# Patient Record
Sex: Female | Born: 1935
Health system: Southern US, Community
[De-identification: ages and names within clinical notes are randomized; demographics above are authoritative.]

## PROBLEM LIST (undated history)

## (undated) DIAGNOSIS — C189 Malignant neoplasm of colon, unspecified: Secondary | ICD-10-CM

## (undated) DIAGNOSIS — M1990 Unspecified osteoarthritis, unspecified site: Secondary | ICD-10-CM

## (undated) DIAGNOSIS — Z85038 Personal history of other malignant neoplasm of large intestine: Secondary | ICD-10-CM

## (undated) DIAGNOSIS — I714 Abdominal aortic aneurysm, without rupture, unspecified: Secondary | ICD-10-CM

## (undated) DIAGNOSIS — I6523 Occlusion and stenosis of bilateral carotid arteries: Secondary | ICD-10-CM

## (undated) DIAGNOSIS — I2510 Atherosclerotic heart disease of native coronary artery without angina pectoris: Secondary | ICD-10-CM

## (undated) DIAGNOSIS — E785 Hyperlipidemia, unspecified: Secondary | ICD-10-CM

## (undated) DIAGNOSIS — I10 Essential (primary) hypertension: Secondary | ICD-10-CM

## (undated) DIAGNOSIS — Z8619 Personal history of other infectious and parasitic diseases: Secondary | ICD-10-CM

## (undated) DIAGNOSIS — Z9889 Other specified postprocedural states: Secondary | ICD-10-CM

## (undated) HISTORY — DX: Personal history of other infectious and parasitic diseases: Z86.19

## (undated) HISTORY — DX: Hyperlipidemia, unspecified: E78.5

## (undated) HISTORY — PX: CATARACT EXTRACTION, BILATERAL: SHX1313

## (undated) HISTORY — DX: Occlusion and stenosis of bilateral carotid arteries: I65.23

## (undated) HISTORY — DX: Malignant neoplasm of colon, unspecified: C18.9

## (undated) HISTORY — DX: Essential (primary) hypertension: I10

## (undated) HISTORY — DX: Unspecified osteoarthritis, unspecified site: M19.90

## (undated) HISTORY — DX: Abdominal aortic aneurysm, without rupture, unspecified: I71.40

## (undated) HISTORY — DX: Abdominal aortic aneurysm, without rupture: I71.4

## (undated) HISTORY — DX: Personal history of other malignant neoplasm of large intestine: Z85.038

## (undated) HISTORY — DX: Atherosclerotic heart disease of native coronary artery without angina pectoris: I25.10

## (undated) HISTORY — DX: Other specified postprocedural states: Z98.890

---

## 1985-09-08 ENCOUNTER — Encounter: Payer: Self-pay | Admitting: Internal Medicine

## 1999-03-08 ENCOUNTER — Encounter: Payer: Self-pay | Admitting: Family Medicine

## 1999-03-08 ENCOUNTER — Encounter: Admission: RE | Admit: 1999-03-08 | Discharge: 1999-03-08 | Payer: Self-pay | Admitting: Family Medicine

## 1999-04-10 ENCOUNTER — Other Ambulatory Visit: Admission: RE | Admit: 1999-04-10 | Discharge: 1999-04-10 | Payer: Self-pay | Admitting: Family Medicine

## 2000-05-20 ENCOUNTER — Encounter: Admission: RE | Admit: 2000-05-20 | Discharge: 2000-05-20 | Payer: Self-pay | Admitting: *Deleted

## 2001-06-17 ENCOUNTER — Encounter: Admission: RE | Admit: 2001-06-17 | Discharge: 2001-06-17 | Payer: Self-pay | Admitting: *Deleted

## 2001-08-18 ENCOUNTER — Encounter (INDEPENDENT_AMBULATORY_CARE_PROVIDER_SITE_OTHER): Payer: Self-pay

## 2001-08-18 ENCOUNTER — Ambulatory Visit (HOSPITAL_COMMUNITY): Admission: RE | Admit: 2001-08-18 | Discharge: 2001-08-18 | Payer: Self-pay | Admitting: *Deleted

## 2001-08-25 ENCOUNTER — Encounter (INDEPENDENT_AMBULATORY_CARE_PROVIDER_SITE_OTHER): Payer: Self-pay

## 2001-08-25 ENCOUNTER — Inpatient Hospital Stay (HOSPITAL_COMMUNITY): Admission: EM | Admit: 2001-08-25 | Discharge: 2001-09-01 | Payer: Self-pay | Admitting: *Deleted

## 2001-08-25 ENCOUNTER — Encounter: Payer: Self-pay | Admitting: General Surgery

## 2001-09-01 ENCOUNTER — Encounter: Payer: Self-pay | Admitting: Gastroenterology

## 2002-03-24 HISTORY — PX: CORONARY ARTERY BYPASS GRAFT: SHX141

## 2002-03-24 HISTORY — PX: PARTIAL COLECTOMY: SHX5273

## 2002-03-26 ENCOUNTER — Encounter (HOSPITAL_COMMUNITY): Admission: RE | Admit: 2002-03-26 | Discharge: 2002-06-24 | Payer: Self-pay | Admitting: *Deleted

## 2002-06-29 ENCOUNTER — Encounter: Admission: RE | Admit: 2002-06-29 | Discharge: 2002-06-29 | Payer: Self-pay | Admitting: *Deleted

## 2002-10-18 ENCOUNTER — Encounter (INDEPENDENT_AMBULATORY_CARE_PROVIDER_SITE_OTHER): Payer: Self-pay | Admitting: Specialist

## 2002-10-18 ENCOUNTER — Ambulatory Visit (HOSPITAL_COMMUNITY): Admission: RE | Admit: 2002-10-18 | Discharge: 2002-10-18 | Payer: Self-pay | Admitting: *Deleted

## 2003-02-22 ENCOUNTER — Inpatient Hospital Stay (HOSPITAL_COMMUNITY): Admission: AD | Admit: 2003-02-22 | Discharge: 2003-02-27 | Payer: Self-pay | Admitting: *Deleted

## 2003-03-27 ENCOUNTER — Encounter (HOSPITAL_COMMUNITY): Admission: RE | Admit: 2003-03-27 | Discharge: 2003-06-25 | Payer: Self-pay | Admitting: *Deleted

## 2003-08-17 ENCOUNTER — Encounter: Admission: RE | Admit: 2003-08-17 | Discharge: 2003-08-17 | Payer: Self-pay | Admitting: Cardiothoracic Surgery

## 2003-09-13 ENCOUNTER — Encounter: Admission: RE | Admit: 2003-09-13 | Discharge: 2003-09-13 | Payer: Self-pay | Admitting: *Deleted

## 2004-02-07 ENCOUNTER — Ambulatory Visit: Payer: Self-pay | Admitting: *Deleted

## 2004-02-28 ENCOUNTER — Ambulatory Visit: Payer: Self-pay

## 2004-04-16 ENCOUNTER — Ambulatory Visit: Payer: Self-pay | Admitting: *Deleted

## 2004-07-31 IMAGING — CR DG CHEST 2V
2 series · 2 of 2 positions shown · non-contrast
Comparison: none

CLINICAL DATA: CABG [DATE], follow up.
 CHEST X-RAY 
 Two views of the chest are compared to a chest x-ray from [HOSPITAL] dated 02/25/03.  The lungs are clear.  No pleural effusion is seen.  Mild cardiomegaly is stable.  Median sternotomy sutures are noted from CABG.
 IMPRESSION
 No active lung disease.  Stable cardiomegaly.

[view not recorded (1 of 2)]
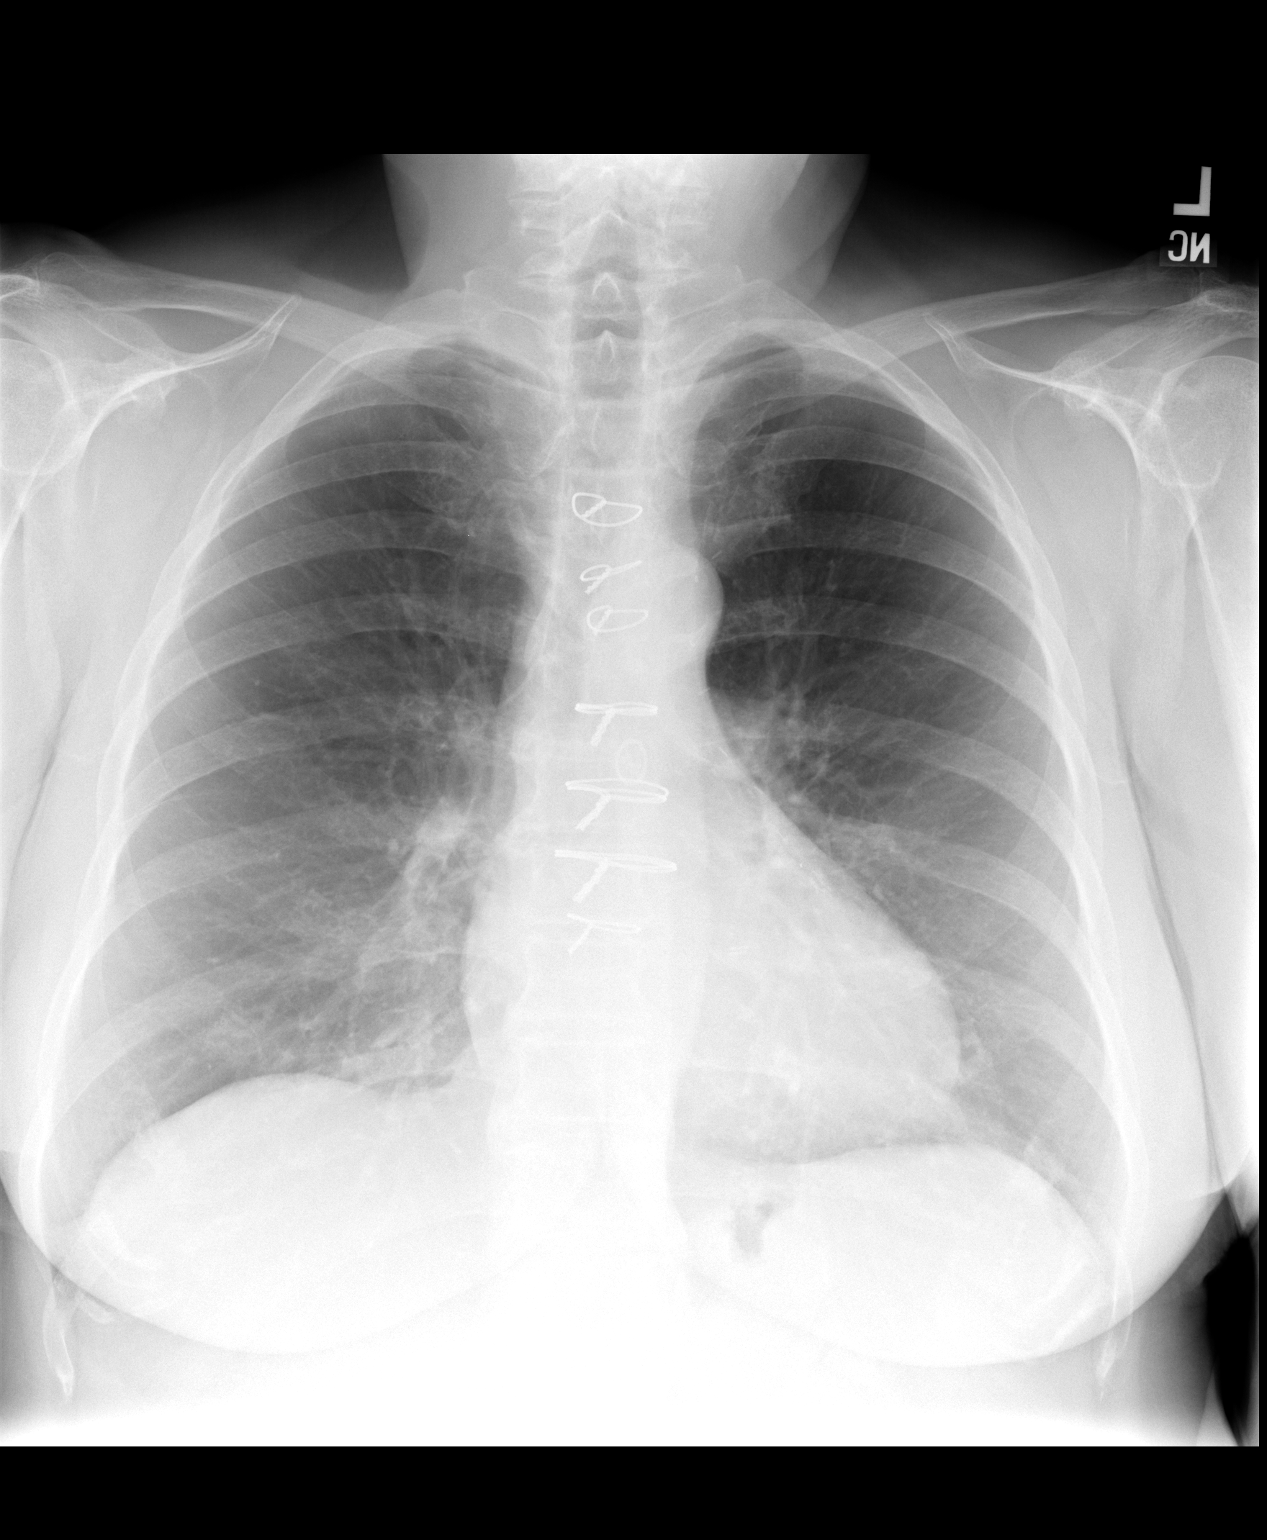

[view not recorded (2 of 2)]
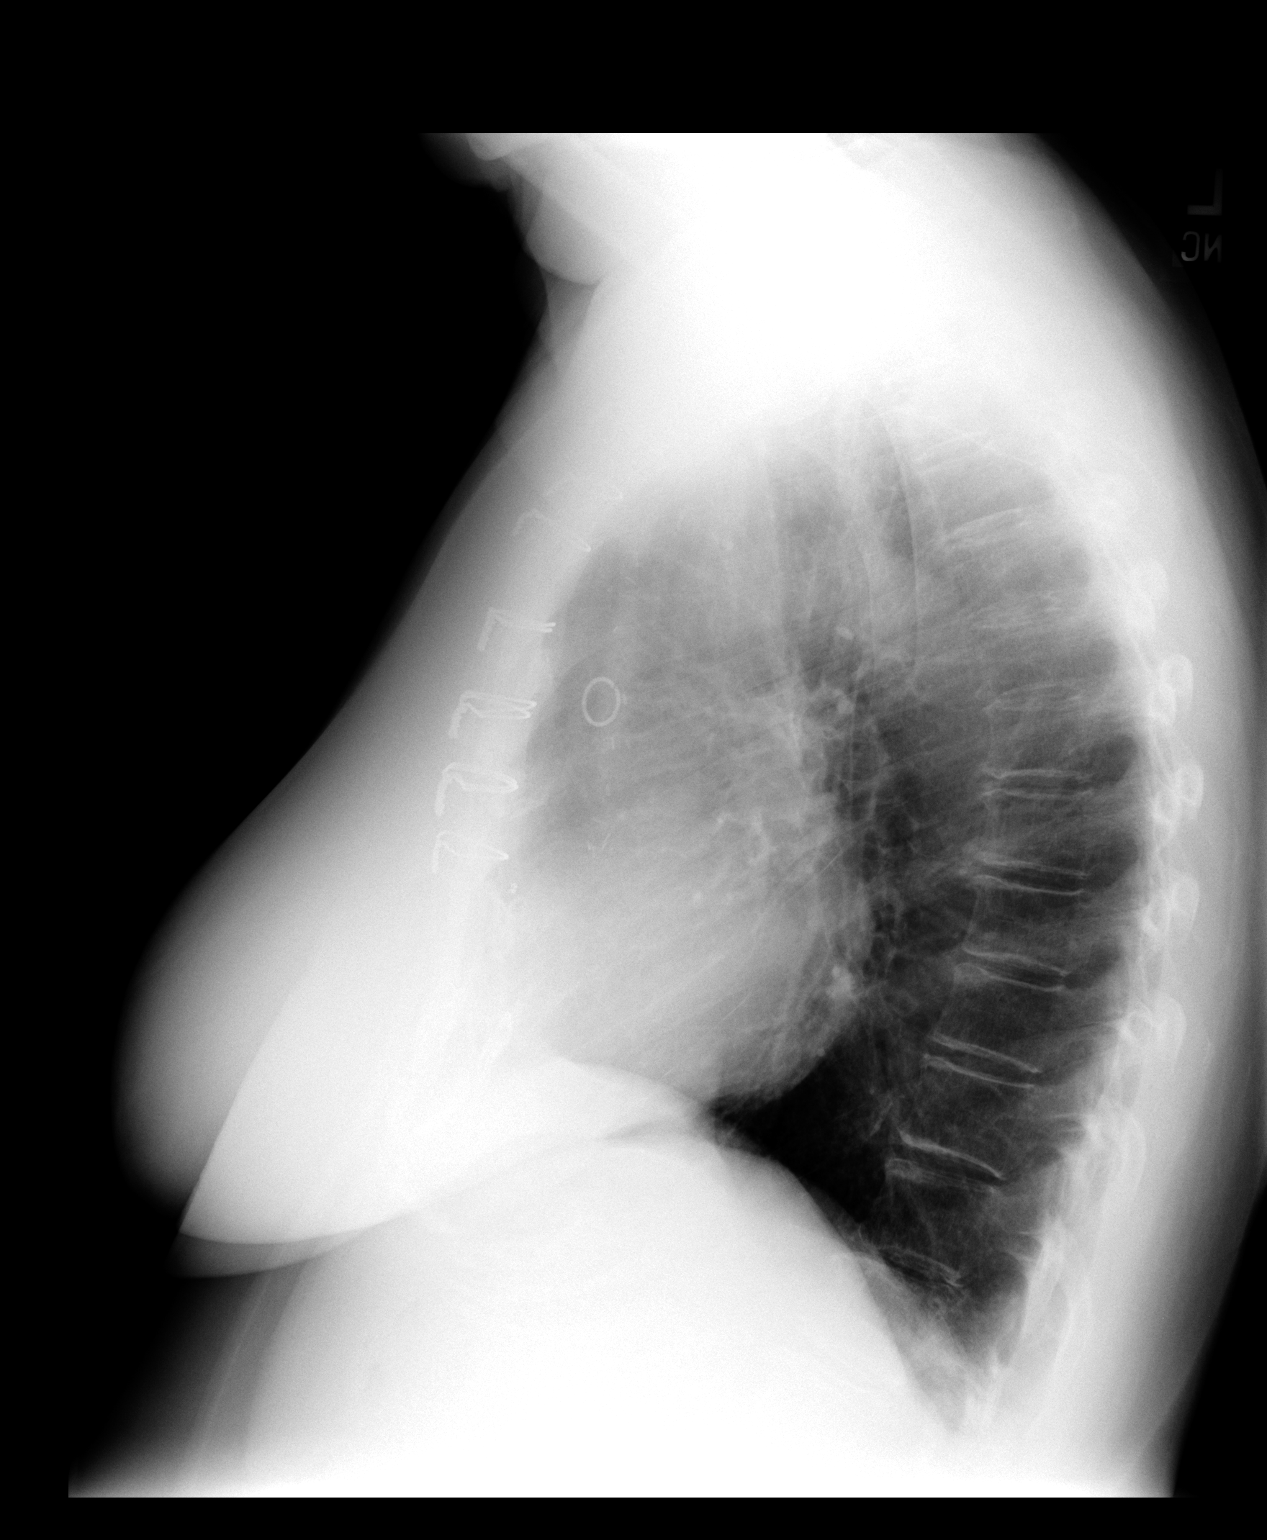

[2 of 2 positions shown; findings below may reference images not displayed]

## 2004-09-05 ENCOUNTER — Ambulatory Visit: Payer: Self-pay | Admitting: *Deleted

## 2004-09-09 ENCOUNTER — Encounter: Admission: RE | Admit: 2004-09-09 | Discharge: 2004-09-09 | Payer: Self-pay | Admitting: *Deleted

## 2004-09-25 ENCOUNTER — Encounter: Admission: RE | Admit: 2004-09-25 | Discharge: 2004-09-25 | Payer: Self-pay | Admitting: Family Medicine

## 2004-09-25 ENCOUNTER — Ambulatory Visit: Payer: Self-pay | Admitting: Family Medicine

## 2004-10-01 ENCOUNTER — Encounter (INDEPENDENT_AMBULATORY_CARE_PROVIDER_SITE_OTHER): Payer: Self-pay | Admitting: Specialist

## 2004-10-01 ENCOUNTER — Ambulatory Visit (HOSPITAL_COMMUNITY): Admission: RE | Admit: 2004-10-01 | Discharge: 2004-10-01 | Payer: Self-pay | Admitting: *Deleted

## 2004-11-12 ENCOUNTER — Ambulatory Visit: Payer: Self-pay | Admitting: Internal Medicine

## 2004-11-15 ENCOUNTER — Ambulatory Visit: Payer: Self-pay | Admitting: Internal Medicine

## 2004-11-26 ENCOUNTER — Ambulatory Visit: Payer: Self-pay | Admitting: Family Medicine

## 2004-11-27 ENCOUNTER — Ambulatory Visit: Payer: Self-pay | Admitting: Cardiology

## 2005-02-11 ENCOUNTER — Ambulatory Visit: Payer: Self-pay | Admitting: Family Medicine

## 2005-02-19 ENCOUNTER — Ambulatory Visit: Payer: Self-pay | Admitting: Cardiology

## 2005-03-24 HISTORY — PX: OTHER SURGICAL HISTORY: SHX169

## 2005-04-21 ENCOUNTER — Ambulatory Visit: Payer: Self-pay

## 2005-06-09 ENCOUNTER — Encounter: Admission: RE | Admit: 2005-06-09 | Discharge: 2005-06-09 | Payer: Self-pay | Admitting: Orthopedic Surgery

## 2005-07-16 ENCOUNTER — Ambulatory Visit: Payer: Self-pay | Admitting: Internal Medicine

## 2005-08-04 ENCOUNTER — Ambulatory Visit: Payer: Self-pay | Admitting: Internal Medicine

## 2005-10-06 ENCOUNTER — Ambulatory Visit: Payer: Self-pay | Admitting: Internal Medicine

## 2005-10-08 ENCOUNTER — Ambulatory Visit: Payer: Self-pay | Admitting: Internal Medicine

## 2005-10-14 ENCOUNTER — Ambulatory Visit: Payer: Self-pay

## 2005-10-17 ENCOUNTER — Ambulatory Visit: Payer: Self-pay

## 2005-10-28 ENCOUNTER — Ambulatory Visit: Payer: Self-pay | Admitting: Family Medicine

## 2005-11-19 ENCOUNTER — Inpatient Hospital Stay (HOSPITAL_COMMUNITY): Admission: RE | Admit: 2005-11-19 | Discharge: 2005-11-23 | Payer: Self-pay | Admitting: Orthopedic Surgery

## 2005-12-17 ENCOUNTER — Ambulatory Visit: Payer: Self-pay | Admitting: Internal Medicine

## 2006-03-24 HISTORY — PX: OTHER SURGICAL HISTORY: SHX169

## 2006-04-29 ENCOUNTER — Ambulatory Visit: Payer: Self-pay

## 2006-06-19 ENCOUNTER — Ambulatory Visit: Payer: Self-pay | Admitting: Internal Medicine

## 2006-06-24 ENCOUNTER — Ambulatory Visit: Payer: Self-pay | Admitting: Internal Medicine

## 2006-06-24 LAB — CONVERTED CEMR LAB
ALT: 16 units/L (ref 0–40)
AST: 19 units/L (ref 0–37)
Albumin: 4 g/dL (ref 3.5–5.2)
Alkaline Phosphatase: 85 units/L (ref 39–117)
BUN: 23 mg/dL (ref 6–23)
Basophils Absolute: 0 10*3/uL (ref 0.0–0.1)
Basophils Relative: 0.5 % (ref 0.0–1.0)
Bilirubin, Direct: 0.1 mg/dL (ref 0.0–0.3)
CO2: 27 meq/L (ref 19–32)
Calcium: 9.3 mg/dL (ref 8.4–10.5)
Chloride: 109 meq/L (ref 96–112)
Cholesterol: 160 mg/dL (ref 0–200)
Creatinine, Ser: 1 mg/dL (ref 0.4–1.2)
Eosinophils Absolute: 0 10*3/uL (ref 0.0–0.6)
Eosinophils Relative: 0.8 % (ref 0.0–5.0)
GFR calc Af Amer: 69 mL/min
GFR calc non Af Amer: 57 mL/min
Glucose, Bld: 112 mg/dL — ABNORMAL HIGH (ref 70–99)
HCT: 36.2 % (ref 36.0–46.0)
HDL: 47.3 mg/dL (ref >39.0)
Hemoglobin: 12.7 g/dL (ref 12.0–15.0)
LDL Cholesterol: 75 mg/dL (ref 0–99)
Lymphocytes Relative: 19.9 % (ref 12.0–46.0)
MCHC: 35.2 g/dL (ref 30.0–36.0)
MCV: 80.1 fL (ref 78.0–100.0)
Monocytes Absolute: 0.4 10*3/uL (ref 0.2–0.7)
Monocytes Relative: 7 % (ref 3.0–11.0)
Neutro Abs: 4 10*3/uL (ref 1.4–7.7)
Neutrophils Relative %: 71.8 % (ref 43.0–77.0)
Platelets: 243 10*3/uL (ref 150–400)
Potassium: 4.8 meq/L (ref 3.5–5.1)
RBC: 4.52 M/uL (ref 3.87–5.11)
RDW: 14.8 % — ABNORMAL HIGH (ref 11.5–14.6)
Sodium: 141 meq/L (ref 135–145)
Total Bilirubin: 0.8 mg/dL (ref 0.3–1.2)
Total CHOL/HDL Ratio: 3.4
Total Protein: 6.4 g/dL (ref 6.0–8.3)
Triglycerides: 187 mg/dL — ABNORMAL HIGH (ref 0–149)
VLDL: 37 mg/dL (ref 0–40)
WBC: 5.5 10*3/uL (ref 4.5–10.5)

## 2006-10-06 ENCOUNTER — Ambulatory Visit: Payer: Self-pay

## 2006-10-06 ENCOUNTER — Ambulatory Visit: Payer: Self-pay | Admitting: Internal Medicine

## 2006-10-06 ENCOUNTER — Encounter: Payer: Self-pay | Admitting: Internal Medicine

## 2006-10-06 LAB — CONVERTED CEMR LAB
ALT: 23 units/L (ref 0–35)
AST: 31 units/L (ref 0–37)
Albumin: 4.1 g/dL (ref 3.5–5.2)
Alkaline Phosphatase: 84 units/L (ref 39–117)
BUN: 26 mg/dL — ABNORMAL HIGH (ref 6–23)
Basophils Absolute: 0 10*3/uL (ref 0.0–0.1)
Basophils Relative: 0.4 % (ref 0.0–1.0)
Bilirubin, Direct: 0.2 mg/dL (ref 0.0–0.3)
CO2: 28 meq/L (ref 19–32)
Calcium: 9.4 mg/dL (ref 8.4–10.5)
Chloride: 103 meq/L (ref 96–112)
Cholesterol: 133 mg/dL (ref 0–200)
Creatinine, Ser: 1.2 mg/dL (ref 0.4–1.2)
Eosinophils Absolute: 0 10*3/uL (ref 0.0–0.6)
Eosinophils Relative: 0.8 % (ref 0.0–5.0)
GFR calc Af Amer: 56 mL/min
GFR calc non Af Amer: 46 mL/min
Glucose, Bld: 90 mg/dL (ref 70–99)
HCT: 36 % (ref 36.0–46.0)
HDL: 40.3 mg/dL (ref >39.0)
Hemoglobin: 12.6 g/dL (ref 12.0–15.0)
LDL Cholesterol: 58 mg/dL (ref 0–99)
Lymphocytes Relative: 18.4 % (ref 12.0–46.0)
MCHC: 34.8 g/dL (ref 30.0–36.0)
MCV: 82.6 fL (ref 78.0–100.0)
Monocytes Absolute: 0.4 10*3/uL (ref 0.2–0.7)
Monocytes Relative: 7.5 % (ref 3.0–11.0)
Neutro Abs: 4.2 10*3/uL (ref 1.4–7.7)
Neutrophils Relative %: 72.9 % (ref 43.0–77.0)
Platelets: 254 10*3/uL (ref 150–400)
Potassium: 4.8 meq/L (ref 3.5–5.1)
RBC: 4.37 M/uL (ref 3.87–5.11)
RDW: 14.1 % (ref 11.5–14.6)
Sodium: 141 meq/L (ref 135–145)
Total Bilirubin: 1.3 mg/dL — ABNORMAL HIGH (ref 0.3–1.2)
Total CHOL/HDL Ratio: 3.3
Total Protein: 6.4 g/dL (ref 6.0–8.3)
Triglycerides: 171 mg/dL — ABNORMAL HIGH (ref 0–149)
VLDL: 34 mg/dL (ref 0–40)
WBC: 5.6 10*3/uL (ref 4.5–10.5)

## 2006-10-21 ENCOUNTER — Ambulatory Visit: Payer: Self-pay | Admitting: Cardiology

## 2006-12-02 ENCOUNTER — Encounter (INDEPENDENT_AMBULATORY_CARE_PROVIDER_SITE_OTHER): Payer: Self-pay | Admitting: *Deleted

## 2006-12-02 ENCOUNTER — Ambulatory Visit (HOSPITAL_COMMUNITY): Admission: RE | Admit: 2006-12-02 | Discharge: 2006-12-02 | Payer: Self-pay | Admitting: *Deleted

## 2006-12-04 ENCOUNTER — Encounter: Payer: Self-pay | Admitting: Family Medicine

## 2006-12-22 ENCOUNTER — Ambulatory Visit: Payer: Self-pay | Admitting: Family Medicine

## 2006-12-22 DIAGNOSIS — M1990 Unspecified osteoarthritis, unspecified site: Secondary | ICD-10-CM | POA: Insufficient documentation

## 2006-12-22 DIAGNOSIS — I10 Essential (primary) hypertension: Secondary | ICD-10-CM

## 2006-12-22 DIAGNOSIS — E785 Hyperlipidemia, unspecified: Secondary | ICD-10-CM

## 2006-12-24 ENCOUNTER — Encounter: Payer: Self-pay | Admitting: Family Medicine

## 2007-01-12 ENCOUNTER — Inpatient Hospital Stay (HOSPITAL_COMMUNITY): Admission: RE | Admit: 2007-01-12 | Discharge: 2007-01-15 | Payer: Self-pay | Admitting: Orthopedic Surgery

## 2007-02-03 ENCOUNTER — Ambulatory Visit: Payer: Self-pay | Admitting: Internal Medicine

## 2007-02-08 ENCOUNTER — Ambulatory Visit: Payer: Self-pay | Admitting: Internal Medicine

## 2007-02-08 LAB — CONVERTED CEMR LAB
ALT: 17 units/L (ref 0–35)
AST: 19 units/L (ref 0–37)
Albumin: 4.1 g/dL (ref 3.5–5.2)
Alkaline Phosphatase: 171 units/L — ABNORMAL HIGH (ref 39–117)
BUN: 26 mg/dL — ABNORMAL HIGH (ref 6–23)
Bilirubin, Direct: 0.1 mg/dL (ref 0.0–0.3)
CO2: 28 meq/L (ref 19–32)
Calcium: 9.4 mg/dL (ref 8.4–10.5)
Chloride: 101 meq/L (ref 96–112)
Cholesterol: 169 mg/dL (ref 0–200)
Creatinine, Ser: 0.9 mg/dL (ref 0.4–1.2)
GFR calc Af Amer: 78 mL/min
GFR calc non Af Amer: 64 mL/min
Glucose, Bld: 112 mg/dL — ABNORMAL HIGH (ref 70–99)
HDL: 39.5 mg/dL (ref >39.0)
LDL Cholesterol: 92 mg/dL (ref 0–99)
Potassium: 4.3 meq/L (ref 3.5–5.1)
Sodium: 135 meq/L (ref 135–145)
Total Bilirubin: 0.9 mg/dL (ref 0.3–1.2)
Total CHOL/HDL Ratio: 4.3
Total Protein: 7.2 g/dL (ref 6.0–8.3)
Triglycerides: 187 mg/dL — ABNORMAL HIGH (ref 0–149)
VLDL: 37 mg/dL (ref 0–40)

## 2007-04-16 ENCOUNTER — Ambulatory Visit: Payer: Self-pay

## 2007-08-03 ENCOUNTER — Ambulatory Visit: Payer: Self-pay | Admitting: Internal Medicine

## 2007-08-06 ENCOUNTER — Ambulatory Visit: Payer: Self-pay | Admitting: Internal Medicine

## 2007-08-06 LAB — CONVERTED CEMR LAB
ALT: 20 units/L (ref 0–35)
AST: 20 units/L (ref 0–37)
Albumin: 4.1 g/dL (ref 3.5–5.2)
Alkaline Phosphatase: 94 units/L (ref 39–117)
BUN: 18 mg/dL (ref 6–23)
Bilirubin, Direct: 0.1 mg/dL (ref 0.0–0.3)
CO2: 30 meq/L (ref 19–32)
Calcium: 9.5 mg/dL (ref 8.4–10.5)
Chloride: 106 meq/L (ref 96–112)
Cholesterol: 165 mg/dL (ref 0–200)
Creatinine, Ser: 1 mg/dL (ref 0.4–1.2)
Direct LDL: 89.5 mg/dL
GFR calc Af Amer: 69 mL/min
GFR calc non Af Amer: 57 mL/min
Glucose, Bld: 115 mg/dL — ABNORMAL HIGH (ref 70–99)
HDL: 35.4 mg/dL — ABNORMAL LOW (ref >39.0)
Potassium: 4.4 meq/L (ref 3.5–5.1)
Sodium: 141 meq/L (ref 135–145)
Total Bilirubin: 1.1 mg/dL (ref 0.3–1.2)
Total CHOL/HDL Ratio: 4.7
Total Protein: 6.7 g/dL (ref 6.0–8.3)
Triglycerides: 229 mg/dL (ref 0–149)
VLDL: 46 mg/dL — ABNORMAL HIGH (ref 0–40)

## 2007-08-11 ENCOUNTER — Ambulatory Visit: Payer: Self-pay | Admitting: Family Medicine

## 2007-08-11 DIAGNOSIS — L0390 Cellulitis, unspecified: Secondary | ICD-10-CM

## 2007-08-11 DIAGNOSIS — L723 Sebaceous cyst: Secondary | ICD-10-CM

## 2007-08-11 DIAGNOSIS — L0291 Cutaneous abscess, unspecified: Secondary | ICD-10-CM | POA: Insufficient documentation

## 2007-08-12 ENCOUNTER — Encounter: Payer: Self-pay | Admitting: Family Medicine

## 2007-08-24 ENCOUNTER — Telehealth: Payer: Self-pay | Admitting: Family Medicine

## 2007-12-20 IMAGING — CR DG CHEST 2V
2 series · 2 of 2 positions shown · non-contrast
Comparison: 11/12/05.

CLINICAL DATA: 80 year-old, osteoarthritis right hip for total hip replacement surgery. Preop respiratory exam.
 CHEST - 2 VIEW:

[view not recorded (1 of 2)]
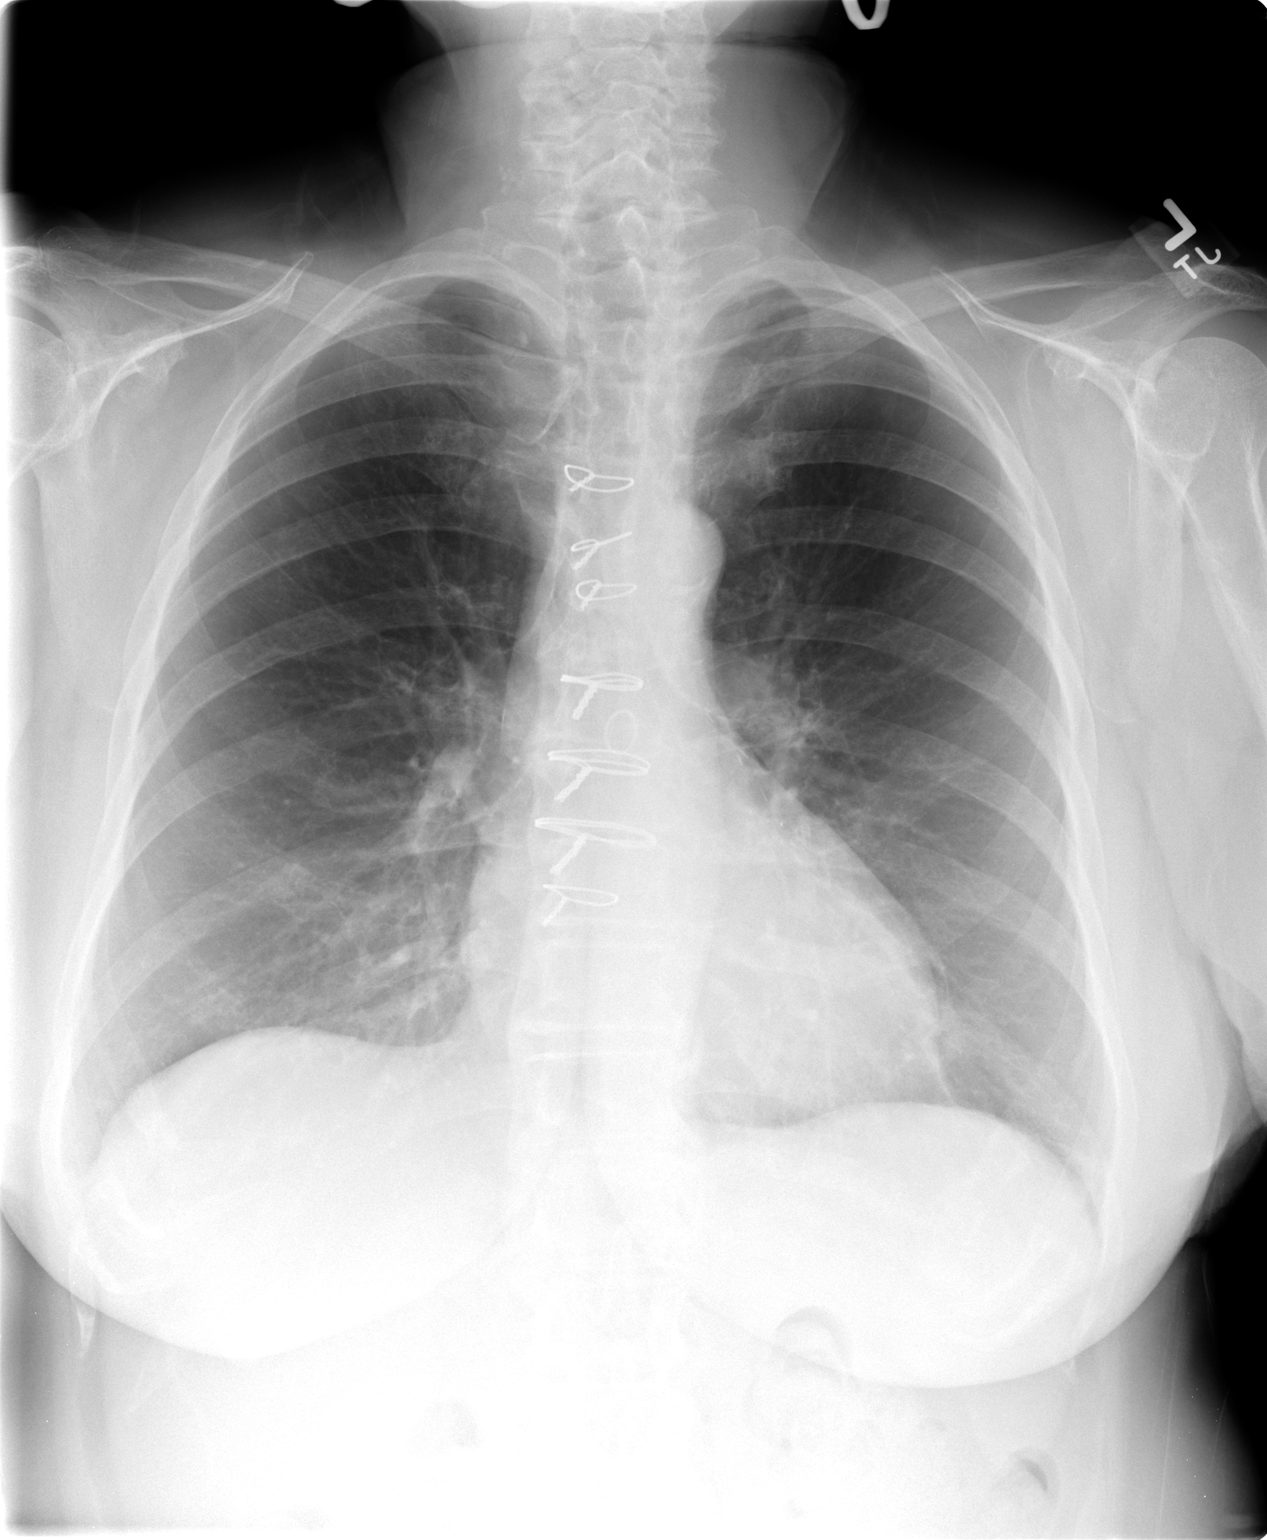

[view not recorded (2 of 2)]
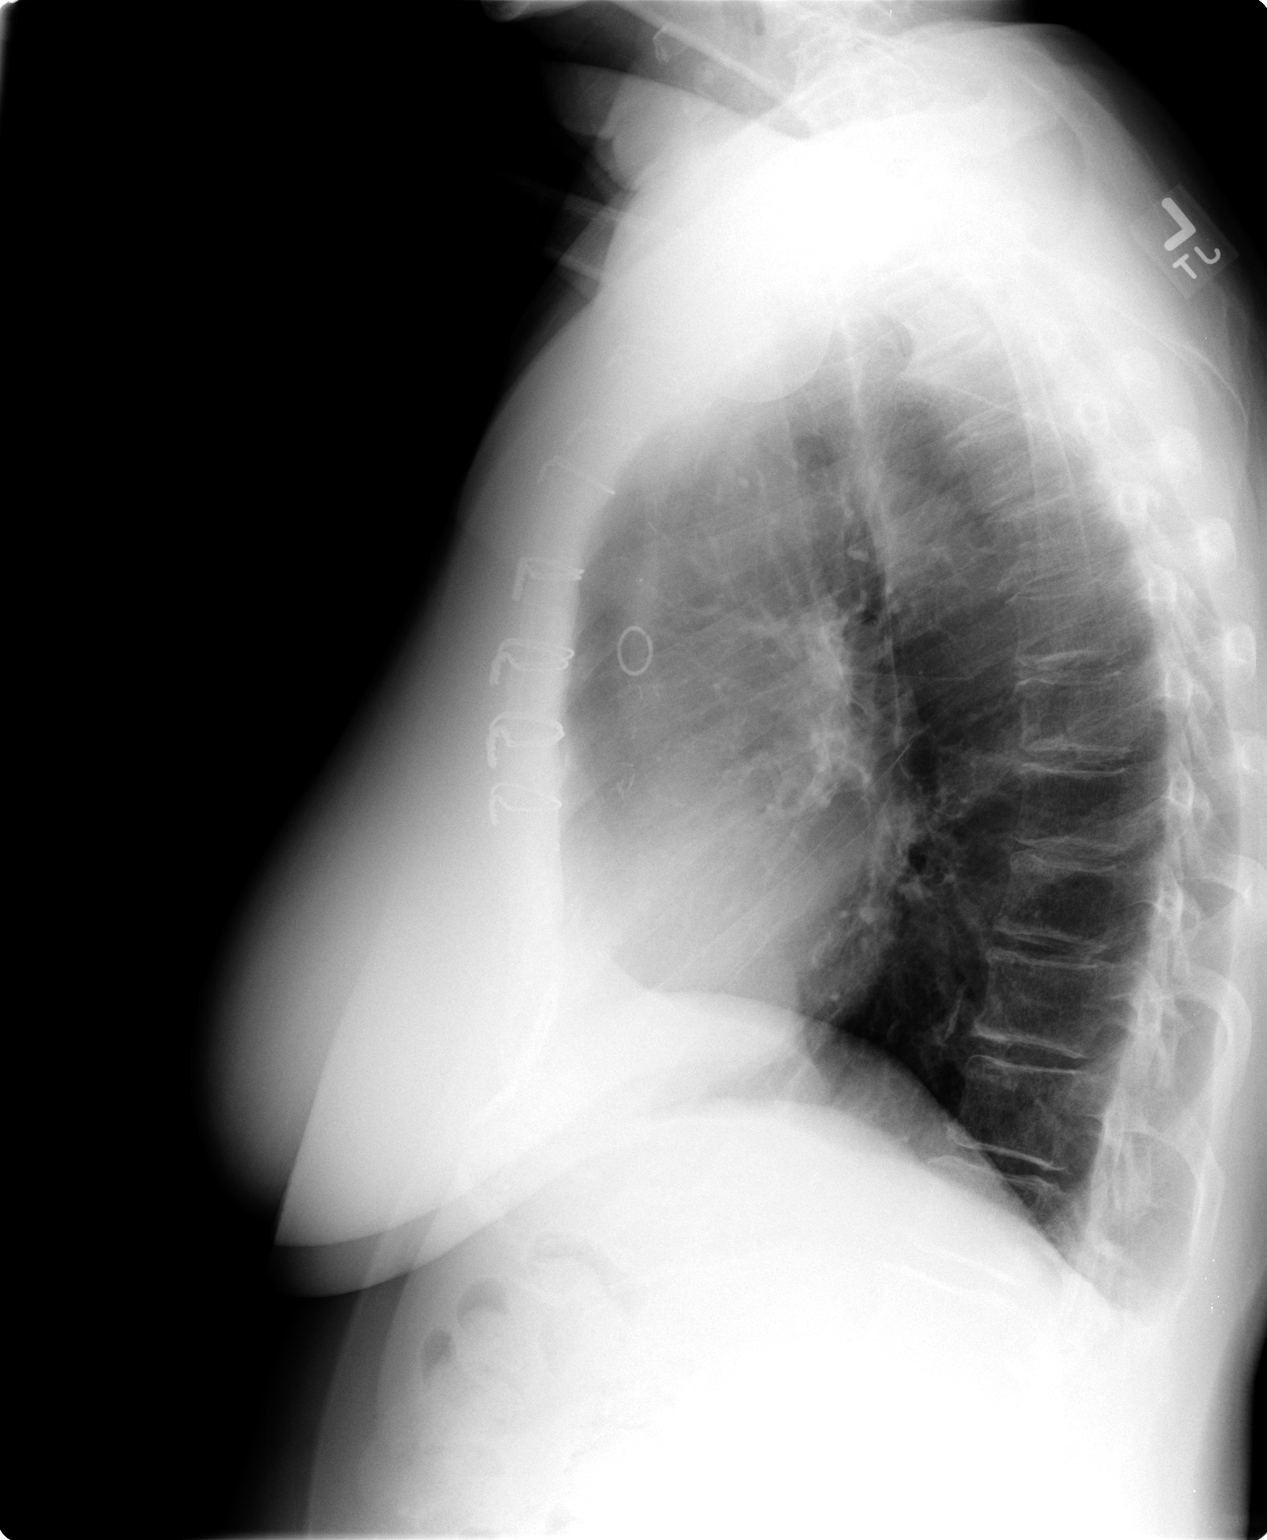

[2 of 2 positions shown; findings below may reference images not displayed]

FINDINGS: Stable surgical changes related to bypass surgery. Heart size is normal. Mediastinal and hilar contours are within normal limits and stable. Minimal basilar scarring changes but no acute pulmonary findings.  bony structures are intact and appear unchanged.
IMPRESSION: 1.  No acute cardiopulmonary findings.
 2.  Minimal basilar scarring changes.

## 2007-12-27 IMAGING — CR DG PORTABLE PELVIS
1 series · 1 of 1 positions shown · non-contrast
Comparison: CT abdomen and pelvis 02/09/05.

CLINICAL DATA: 80 year-old female with right hip arthroplasty.
 PORTABLE PELVIS ? 1 VIEW:

[view not recorded]
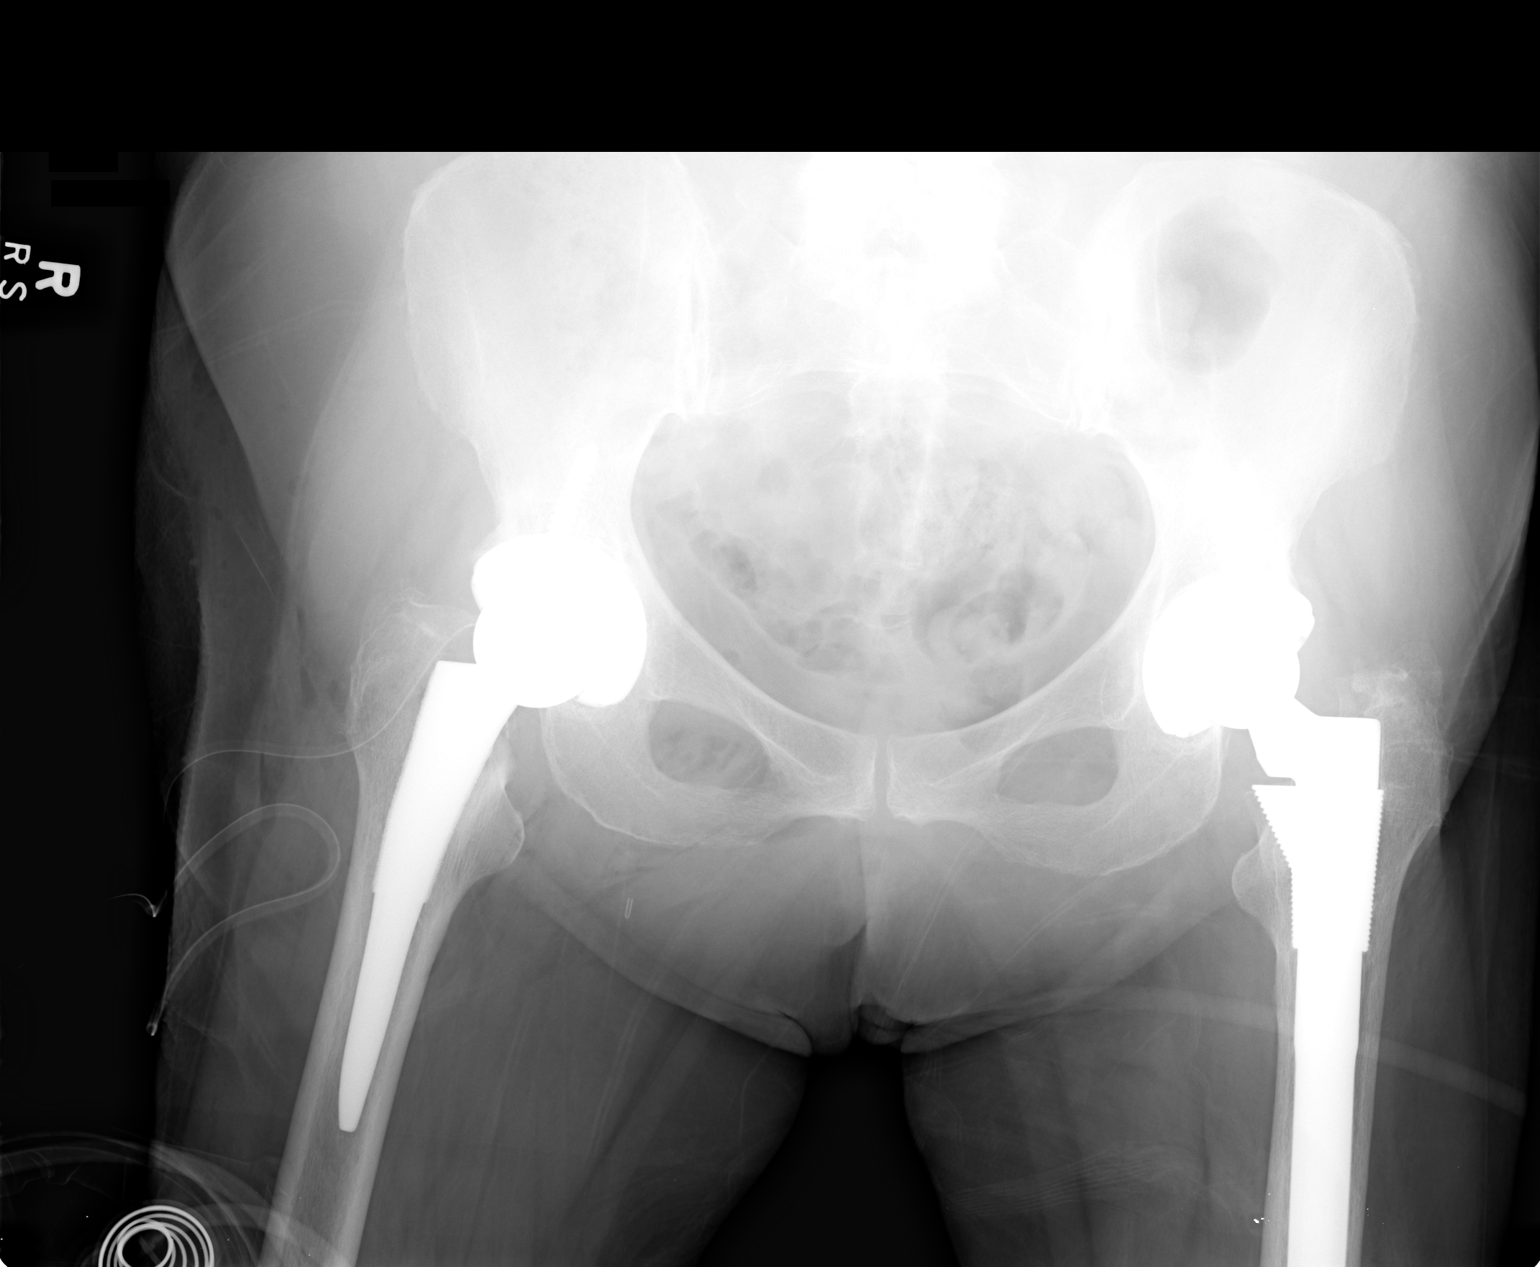

[1 of 1 positions shown; findings below may reference images not displayed]

FINDINGS: Since the prior CT, the patient has undergone bilateral total hip arthroplasties.  On the right, there is a surgical drain in place.  The bilateral arthroplasty components appear normally aligned on this single view.  No adverse hardware features are identified.  No acute fracture or dislocation is evident about the pelvis.
IMPRESSION: Bilateral total hip arthroplasties, no adverse features.

## 2008-04-24 ENCOUNTER — Ambulatory Visit: Payer: Self-pay | Admitting: Internal Medicine

## 2008-05-02 ENCOUNTER — Ambulatory Visit: Payer: Self-pay | Admitting: Internal Medicine

## 2008-05-02 LAB — CONVERTED CEMR LAB
ALT: 35 units/L (ref 0–35)
AST: 26 units/L (ref 0–37)
Albumin: 4.1 g/dL (ref 3.5–5.2)
Alkaline Phosphatase: 77 units/L (ref 39–117)
BUN: 21 mg/dL (ref 6–23)
Bilirubin, Direct: 0.1 mg/dL (ref 0.0–0.3)
CO2: 28 meq/L (ref 19–32)
Calcium: 9.3 mg/dL (ref 8.4–10.5)
Chloride: 104 meq/L (ref 96–112)
Cholesterol: 111 mg/dL (ref 0–200)
Creatinine, Ser: 1 mg/dL (ref 0.4–1.2)
GFR calc Af Amer: 69 mL/min
GFR calc non Af Amer: 57 mL/min
Glucose, Bld: 100 mg/dL — ABNORMAL HIGH (ref 70–99)
HDL: 40 mg/dL (ref >39.0)
LDL Cholesterol: 39 mg/dL (ref 0–99)
Potassium: 4.5 meq/L (ref 3.5–5.1)
Sodium: 137 meq/L (ref 135–145)
Total Bilirubin: 1.2 mg/dL (ref 0.3–1.2)
Total CHOL/HDL Ratio: 2.8
Total Protein: 6.4 g/dL (ref 6.0–8.3)
Triglycerides: 160 mg/dL — ABNORMAL HIGH (ref 0–149)
VLDL: 32 mg/dL (ref 0–40)

## 2008-07-05 ENCOUNTER — Telehealth: Payer: Self-pay | Admitting: Internal Medicine

## 2008-07-10 ENCOUNTER — Telehealth: Payer: Self-pay | Admitting: Internal Medicine

## 2008-08-23 ENCOUNTER — Telehealth: Payer: Self-pay | Admitting: Internal Medicine

## 2008-10-26 ENCOUNTER — Ambulatory Visit: Payer: Self-pay | Admitting: Internal Medicine

## 2008-10-26 ENCOUNTER — Encounter: Payer: Self-pay | Admitting: Cardiovascular Disease

## 2008-10-26 ENCOUNTER — Encounter: Payer: Self-pay | Admitting: Internal Medicine

## 2008-10-26 DIAGNOSIS — I6529 Occlusion and stenosis of unspecified carotid artery: Secondary | ICD-10-CM

## 2008-10-26 DIAGNOSIS — I714 Abdominal aortic aneurysm, without rupture, unspecified: Secondary | ICD-10-CM | POA: Insufficient documentation

## 2008-11-22 ENCOUNTER — Telehealth: Payer: Self-pay | Admitting: Internal Medicine

## 2008-12-01 ENCOUNTER — Ambulatory Visit: Payer: Self-pay | Admitting: Internal Medicine

## 2008-12-05 ENCOUNTER — Ambulatory Visit: Payer: Self-pay | Admitting: Internal Medicine

## 2008-12-05 LAB — CONVERTED CEMR LAB
ALT: 41 units/L — ABNORMAL HIGH (ref 0–35)
AST: 33 units/L (ref 0–37)
Albumin: 4 g/dL (ref 3.5–5.2)
Alkaline Phosphatase: 81 units/L (ref 39–117)
BUN: 20 mg/dL (ref 6–23)
Bilirubin, Direct: 0.1 mg/dL (ref 0.0–0.3)
CO2: 28 meq/L (ref 19–32)
Calcium: 9.2 mg/dL (ref 8.4–10.5)
Chloride: 110 meq/L (ref 96–112)
Cholesterol: 109 mg/dL (ref 0–200)
Creatinine, Ser: 1.2 mg/dL (ref 0.4–1.2)
GFR calc non Af Amer: 45.93 mL/min (ref >60)
Glucose, Bld: 110 mg/dL — ABNORMAL HIGH (ref 70–99)
HDL: 41.1 mg/dL (ref >39.00)
LDL Cholesterol: 41 mg/dL (ref 0–99)
Potassium: 5 meq/L (ref 3.5–5.1)
Sodium: 142 meq/L (ref 135–145)
Total Bilirubin: 1 mg/dL (ref 0.3–1.2)
Total CHOL/HDL Ratio: 3
Total Protein: 6.4 g/dL (ref 6.0–8.3)
Triglycerides: 137 mg/dL (ref 0.0–149.0)
VLDL: 27.4 mg/dL (ref 0.0–40.0)

## 2009-01-16 ENCOUNTER — Ambulatory Visit: Payer: Self-pay | Admitting: Gastroenterology

## 2009-02-28 ENCOUNTER — Telehealth (INDEPENDENT_AMBULATORY_CARE_PROVIDER_SITE_OTHER): Payer: Self-pay | Admitting: *Deleted

## 2009-05-02 ENCOUNTER — Encounter: Payer: Self-pay | Admitting: Internal Medicine

## 2009-05-02 ENCOUNTER — Ambulatory Visit: Payer: Self-pay

## 2009-07-05 ENCOUNTER — Encounter (INDEPENDENT_AMBULATORY_CARE_PROVIDER_SITE_OTHER): Payer: Self-pay | Admitting: *Deleted

## 2009-08-28 ENCOUNTER — Ambulatory Visit: Payer: Self-pay | Admitting: Internal Medicine

## 2009-10-31 ENCOUNTER — Telehealth: Payer: Self-pay | Admitting: Internal Medicine

## 2010-03-05 ENCOUNTER — Telehealth: Payer: Self-pay | Admitting: Internal Medicine

## 2010-04-05 ENCOUNTER — Telehealth: Payer: Self-pay | Admitting: Internal Medicine

## 2010-04-23 NOTE — Assessment & Plan Note (Signed)
Summary: f77m      Allergies Added: NKDA  Primary Provider:  Emeterio Reeve MD  CC:  f51m.  Pt reports no cardiac concerns but is concerned with pain in her thighs.  History of Present Illness: Audrey [name] is a delightful 77 year old woman with history of coronary artery disease, status post two-vessel bypass grafting in December 2004.  She also has a history of hypertension, hyperlipidemia, bilateral carotid stenoses, as well as elevated liver enzymes on Lipitor.  She returns today for routine followup.   Overall doing pretty well. No CP. Mild exertional dyspnea which is unchanged. Main complaint is pain in back radiating down back of both legs.  Denies amaurosis or unilateral weakness.    Carotid u/s 2/11 showed stable disease 60-79% bilaterally. Ab US showed very small AAA 2.1x2.1.   Current Medications (verified): 1)  Folic Acid 1 Mg  Tabs (Folic Acid) .... Once Daily 2)  Micardis 20 Mg  Tabs (Telmisartan) .... Once Daily 3)  Crestor 40 Mg  Tabs (Rosuvastatin Calcium) .... Once Daily 4)  Adult Aspirin Ec Low Strength 81 Mg  Tbec (Aspirin) .... Once Daily 5)  Multivitamins   Tabs (Multiple Vitamin) .... Once Daily 6)  Vitamin C 250 Mg Tabs (Ascorbic Acid) .... Take One Tablet By Mouth Once Daily. 7)  Metoprolol Succinate 50 Mg  Tb24 (Metoprolol Succinate) .... Once Daily 8)  Plavix 75 Mg Tabs (Clopidogrel Bisulfate) .... Take One Tablet By Mouth Daily 9)  Zetia 10 Mg Tabs (Ezetimibe) .... Take One Tablet By Mouth Daily.  Allergies (verified): No Known Drug Allergies  Past History:  Past Medical History:  1.  Coronary artery disease including left main disease.      1.  Status post two vessel bypass surgery by Dr. Servando Snare in December of          2004 with a left internal mammary artery to the left anterior          descending and a saphenous vein graft to the circumflex.  2.  Hypertension.  3.  Hyperlipidemia.      1.  Elevated liver function tests on Lipitor.  Unable to  tolerate the          Vytorin secondary to nausea.  4.  Bilateral carotid artery stenosis.           --Dopplers June 2006 showed 60-79% B           --Dopplers august 2010 60-79%B            --Dopplers Feb 2011 60-79%B   5.  Severe osteoarthritis s/p bilateral hip replacement.  6.  h/o colon CA s/p resection 2004; sigmoid resection T1N0 lesion  7. Smalll AAA by u/s 8/10: 2.1 cm x 2.1 cm  Review of Systems       As per HPI and past medical history; otherwise all systems negative.   Vital Signs:  Patient profile:   77 year old female Height:      61 inches Weight:      136 pounds BMI:     25.79 Pulse rate:   62 / minute Pulse rhythm:   regular Cuff size:   regular  Vitals Entered By: Doug Sou CMA (August 28, 2009 12:14 PM)  Physical Exam  General:  Well appearing. no resp difficulty HEENT: normal Neck: supple. no JVD. Carotids 2+ bilat; no bruits. No lymphadenopathy or thryomegaly appreciated. Cor: PMI nondisplaced. Regular rate & rhythm. No rubs, gallops, murmur. Lungs: clear with  mildly decreased breath sounds Abdomen: soft, nontender, nondistended. No bruit. Good bowel sounds. Extremities: no cyanosis, clubbing, rash, edema Neuro: alert & orientedx3, cranial nerves grossly intact. moves all 4 extremities w/o difficulty. affect pleasant    Impression & Recommendations:  Problem # 1:  CORONARY ATHEROSCLEROSIS, ARTERY BYPASS GRAFT (ICD-414.04) Stable. No evidence of ischemia. She is now almost 8 years out from CABG. Remains asymptomatic. Low threshold to do stress test if she develops any symptoms.   Problem # 2:  ABDOMINAL AORTIC ANEURYSM (ICD-441.4) Stable. Due for u/s in  months.   Problem # 3:  CAROTID ARTERY DISEASE (ICD-433.10) Stable. No symptoms. Due for repeat u/s in 6 months. Continue statin.   Problem # 4:  Back pain Suspect spinal stenosis or disk disease. Asked her to f/u with PCP.   Other Orders: EKG w/ Interpretation (93000)  Patient  Instructions: 1)  Your physician wants you to follow-up in:   Feb. 2012  You will receive a reminder letter in the mail two months in advance. If you don't receive a letter, please call our office to schedule the follow-up appointment.

## 2010-04-23 NOTE — Miscellaneous (Signed)
Summary: Orders Update  Clinical Lists Changes  Orders: Added new Test order of Carotid Duplex (Carotid Duplex) - Signed 

## 2010-04-23 NOTE — Progress Notes (Signed)
Summary: refill med - mail to patient   Phone Note Outgoing Call Call back at [facility] Phone [phone] Call back at Work Phone [phone] Call back at pt's work Message from:  Patient on October 31, 2009 10:22 AM  Refills Requested: Medication #1:  MICARDIS 20 MG  TABS once daily  Method Requested: Mail to Patient Initial call taken by: Neil Crouch,  October 31, 2009 10:23 AM Summary of Call: CMA spoke with pt, ask to verify Rx needed to be mail, pt stated needed to sent to [facility], advised pt to ask Dr. Haroldine Laws to write Rx today, and will mail today.  Pt gave verble understanding. Hansel Feinstein CMA  October 31, 2009 1:06 PM      Appended Document: refill med - mail to patient prescription signed by Dr Haroldine Laws and mailed to pt Kevan Rosebush, RN  October 31, 2009 1:14 PM    Clinical Lists Changes  Medications: Rx of MICARDIS 20 MG  TABS (TELMISARTAN) once daily;  #90 x 3;  Signed;  Entered by: Kevan Rosebush, RN;  Authorized by: Jolaine Artist, MD, [facility];  Method used: Print then Give to Patient    Prescriptions: MICARDIS 20 MG  TABS (TELMISARTAN) once daily  #90 x 3   Entered by:   Kevan Rosebush, RN   Authorized by:   Jolaine Artist, MD, [facility]   Signed by:   Kevan Rosebush, RN on 10/31/2009   Method used:   Print then Give to Patient   RxID:   [id number]

## 2010-04-23 NOTE — Op Note (Signed)
Summary: Operative Report-Liver Bx  Operative Report-Liver Bx   Imported By: Madlyn Frankel CMA (AAMA) 12/07/2009 16:56:58  _____________________________________________________________________  External Attachment:    Type:   Image     Comment:   External Document

## 2010-04-23 NOTE — Letter (Signed)
Summary: Appointment - [place], [place]    Phone:   Fax:      July 05, 2009 MRN: [id number]   [facility] [place], [place]  [address]   Dear Ms. ALCIVAR,   Due to a change in our office schedule, your appointment on 08-22-2009 at 2:15p.m.               must be changed.  It is very important that we reach you to reschedule this appointment. We look forward to participating in your health care needs. Please contact us at the number listed above at your earliest convenience to reschedule this appointment.     Sincerely,     [facility] Scheduling Team

## 2010-04-25 NOTE — Progress Notes (Signed)
Summary: rx refill   Phone Note Refill Request Message from:  Patient on March 05, 2010 8:48 AM  Refills Requested: Medication #1:  METOPROLOL SUCCINATE 50 MG  TB24 once daily pt needs prescription to be mail to her.   Method Requested: Mail to Patient Initial call taken by: Regan Lemming,  March 05, 2010 8:48 AM    Prescriptions: METOPROLOL SUCCINATE 50 MG  TB24 (METOPROLOL SUCCINATE) once daily  #90 x 3   Entered by:   Mignon Pine, RMA   Authorized by:   Jolaine Artist, MD, [facility]   Signed by:   Mignon Pine, RMA on 03/05/2010   Method used:   Print then Give to Patient   RxID:   [id number]

## 2010-04-25 NOTE — Progress Notes (Signed)
Summary: pt rtn call   Phone Note Call from Patient Call back at Home Phone [phone]   Caller: Patient Reason for Call: Talk to Nurse, Talk to Doctor Summary of Call: pt rtn call about rscing appt Initial call taken by: Shelda Pal,  April 05, 2010 3:17 PM  Follow-up for Phone Call        felecha-ext [phone] to call pt back regarding carotids.  Follow-up by: Joan Flores RN,  April 05, 2010 3:43 PM

## 2010-05-07 ENCOUNTER — Encounter: Payer: Self-pay | Admitting: Internal Medicine

## 2010-05-08 ENCOUNTER — Other Ambulatory Visit: Payer: Self-pay | Admitting: Internal Medicine

## 2010-05-08 ENCOUNTER — Encounter: Payer: Self-pay | Admitting: Internal Medicine

## 2010-05-08 ENCOUNTER — Ambulatory Visit (INDEPENDENT_AMBULATORY_CARE_PROVIDER_SITE_OTHER): Payer: Medicare Other | Admitting: Internal Medicine

## 2010-05-08 ENCOUNTER — Encounter (INDEPENDENT_AMBULATORY_CARE_PROVIDER_SITE_OTHER): Payer: Medicare Other

## 2010-05-08 DIAGNOSIS — I639 Cerebral infarction, unspecified: Secondary | ICD-10-CM

## 2010-05-08 DIAGNOSIS — I2510 Atherosclerotic heart disease of native coronary artery without angina pectoris: Secondary | ICD-10-CM

## 2010-05-08 DIAGNOSIS — I6529 Occlusion and stenosis of unspecified carotid artery: Secondary | ICD-10-CM

## 2010-05-15 ENCOUNTER — Ambulatory Visit (HOSPITAL_COMMUNITY)
Admission: RE | Admit: 2010-05-15 | Discharge: 2010-05-15 | Disposition: A | Discharge status: Home / Self Care | Payer: Medicare Other | Source: Ambulatory Visit | Attending: Internal Medicine | Admitting: Internal Medicine

## 2010-05-15 ENCOUNTER — Encounter: Payer: Self-pay | Admitting: Internal Medicine

## 2010-05-15 DIAGNOSIS — I639 Cerebral infarction, unspecified: Secondary | ICD-10-CM

## 2010-05-15 DIAGNOSIS — I658 Occlusion and stenosis of other precerebral arteries: Secondary | ICD-10-CM | POA: Insufficient documentation

## 2010-05-15 DIAGNOSIS — H532 Diplopia: Secondary | ICD-10-CM | POA: Insufficient documentation

## 2010-05-15 DIAGNOSIS — I6529 Occlusion and stenosis of unspecified carotid artery: Secondary | ICD-10-CM | POA: Insufficient documentation

## 2010-05-15 DIAGNOSIS — I672 Cerebral atherosclerosis: Secondary | ICD-10-CM | POA: Insufficient documentation

## 2010-05-15 NOTE — Assessment & Plan Note (Signed)
Summary: f4m      Allergies Added: NKDA  Visit Type:  Follow-up Primary Provider:  Emeterio Reeve MD  CC:  double vision.  History of Present Illness: Audrey [name] is a delightful 77 year old woman with history of coronary artery disease, status post two-vessel bypass grafting in December 2004.  She also has a history of hypertension, hyperlipidemia, bilateral carotid stenoses, as well as elevated liver enzymes on Lipitor.  She returns today for routine followup.   Doing very well. Working every day. No CP or significant dyspnea. Saw eye doctor yesterday for occasional double vision while watching TV at night.   Denies amaurosis or unilateral weakness. No palpitations. Ophthalmologist recommended seeing a neurologist.    Carotid u/s 2/11 showed stable disease 60-79% bilaterally. Do for f/u scan today. Ab US showed very small AAA 2.1x2.1.   Current Medications (verified): 1)  Micardis 20 Mg  Tabs (Telmisartan) .... Once Daily 2)  Crestor 40 Mg  Tabs (Rosuvastatin Calcium) .... Once Daily 3)  Adult Aspirin Ec Low Strength 81 Mg  Tbec (Aspirin) .... Once Daily 4)  Multivitamins   Tabs (Multiple Vitamin) .... Once Daily 5)  Vitamin C 250 Mg Tabs (Ascorbic Acid) .... Take One Tablet By Mouth Once Daily. 6)  Metoprolol Succinate 50 Mg  Tb24 (Metoprolol Succinate) .... Once Daily 7)  Plavix 75 Mg Tabs (Clopidogrel Bisulfate) .... Take One Tablet By Mouth Daily 8)  Zetia 10 Mg Tabs (Ezetimibe) .... Take One Tablet By Mouth Daily.  Allergies (verified): No Known Drug Allergies  Past History:  Past Medical History: Last updated: 08/28/2009  1.  Coronary artery disease including left main disease.      1.  Status post two vessel bypass surgery by Dr. Servando Snare in December of          2004 with a left internal mammary artery to the left anterior          descending and a saphenous vein graft to the circumflex.  2.  Hypertension.  3.  Hyperlipidemia.      1.  Elevated liver function tests  on Lipitor.  Unable to tolerate the          Vytorin secondary to nausea.  4.  Bilateral carotid artery stenosis.           --Dopplers June 2006 showed 60-79% B           --Dopplers august 2010 60-79%B            --Dopplers Feb 2011 60-79%B   5.  Severe osteoarthritis s/p bilateral hip replacement.  6.  h/o colon CA s/p resection 2004; sigmoid resection T1N0 lesion  7. Smalll AAA by u/s 8/10: 2.1 cm x 2.1 cm  Past Surgical History: Last updated: February 11, 2009 1) Partial colectomy 2004, sigmoid resection, Dr. Dalbert Batman 2) Left hip replacement 2007 3) Right hip replacement 2008  Family History: Last updated: 2009/02/11 Mother died at age 64, had intra-abdominal cancer and gallstones.  Father died at age 66 of a myocardial infarction.  She has two sisters.  no colon cancer.  Social History: Last updated: February 11, 2009 The patient is widowed.  She has a clerical history.  No  intake of alcohol or tobacco products.  Has three children.      Review of Systems       As per HPI and past medical history; otherwise all systems negative.   Vital Signs:  Patient profile:   77 year old female Height:  61 inches Weight:      134 pounds BMI:     25.41 Pulse rate:   59 / minute BP sitting:   98 / 56  (left arm) Cuff size:   regular  Vitals Entered By: Hansel Feinstein CMA (May 08, 2010 9:55 AM)  Physical Exam  General:  Well appearing. no resp difficulty HEENT: normal Neck: supple. no JVD. Carotids 2+ bilat; no bruits. No lymphadenopathy or thryomegaly appreciated. Cor: PMI nondisplaced. Regular rate & rhythm. No rubs, gallops, murmur. Lungs: clear with mildly decreased breath sounds Abdomen: soft, nontender, nondistended. No bruit. Good bowel sounds. Extremities: no cyanosis, clubbing, rash, edema Neuro: alert & orientedx3, cranial nerves grossly intact. no pronator drift. no nystagmus. normal peripheral vision. FTN exam ok. moves all 4 extremities w/o difficulty. affect  pleasant    Impression & Recommendations:  Problem # 1:  CORONARY ATHEROSCLEROSIS, ARTERY BYPASS GRAFT (ICD-414.04) Stable. No evidence of ischemia. Continue current regimen.  Problem # 2:  CAROTID ARTERY DISEASE (ICD-433.10) Due for repeat testing today. No obvious neuro deficits on exam but given diplopia will schedule MRI/MRA of brain and have her f/u with Neurology.   Problem # 3:  ABDOMINAL AORTIC ANEURYSM (ICD-441.4) Small. Will need f/u u/s scheduled at next visit.   Problem # 4:  HYPERLIPIDEMIA NEC/NOS (ICD-272.4) Followed by PCP. Goal LDL < 70. Continue current regimen.   Other Orders: MRI (MRI) EKG w/ Interpretation (93000) Neurology Referral (Neuro)  Patient Instructions: 1)  MRI/MRA 2)  You have been referred to Neurology 3)  Your physician wants you to follow-up in:  6 months You will receive a reminder letter in the mail two months in advance. If you don't receive a letter, please call our office to schedule the follow-up appointment.   Appended Document: f39m    Clinical Lists Changes  Orders: Added new Referral order of MRA (MRA) - Signed Added new Referral order of MRI (MRI) - Signed

## 2010-05-15 NOTE — Miscellaneous (Signed)
Summary: Orders Update  Clinical Lists Changes  Orders: Added new Test order of Carotid Duplex (Carotid Duplex) - Signed 

## 2010-05-23 ENCOUNTER — Telehealth: Payer: Self-pay | Admitting: Internal Medicine

## 2010-05-28 ENCOUNTER — Telehealth: Payer: Self-pay | Admitting: Internal Medicine

## 2010-05-30 NOTE — Progress Notes (Signed)
Summary: rx to be mail to pt home   Phone Note Refill Request Message from:  Patient on May 23, 2010 9:56 AM  rosuvastatin 40 mg once a day.  ezetimibe 10mg  once a day. pt would the written rx to be mail to her home   Method Requested: Mail to Patient Initial call taken by: Regan Lemming,  May 23, 2010 9:58 AM    Prescriptions: ZETIA 10 MG TABS (EZETIMIBE) Take one tablet by mouth daily.  #90 x 3   Entered by:   Mignon Pine, RMA   Authorized by:   Jolaine Artist, MD, [facility]   Signed by:   Mignon Pine, RMA on 05/23/2010   Method used:   Print then Give to Patient   RxID:   [id number] CRESTOR 40 MG  TABS (ROSUVASTATIN CALCIUM) once daily  #90 x 3   Entered by:   Mignon Pine, RMA   Authorized by:   Jolaine Artist, MD, [facility]   Signed by:   Mignon Pine, RMA on 05/23/2010   Method used:   Print then Give to Patient   RxID:   [id number]

## 2010-06-04 NOTE — Progress Notes (Signed)
Summary: refill request   Phone Note Refill Request Message from:  Patient on May 28, 2010 11:29 AM  Refills Requested: Medication #1:  PLAVIX 75 MG TABS Take one tablet by mouth daily written rx   Method Requested: Mail to Patient Initial call taken by: Lorenda Hatchet,  May 28, 2010 11:30 AM  Follow-up for Phone Call        pt called me back, mailed rx to her .  Follow-up by: Evelene Croon, [place],  May 29, 2010 10:36 AM    Called pt and left message to verify if pt wanted written rx mailed to her or does she prefer for me to transfer it to her pharmacy electronically. [place], [place]  May 28, 2010 2:29 PM

## 2010-08-06 NOTE — Op Note (Signed)
NAMESTACEY, Audrey [name] NO.:  [id number]   MEDICAL RECORD NO.:  [id number]          PATIENT TYPE:  AMB   LOCATION:  ENDO                         FACILITY[facility]   PHYSICIAN:  Waverly Ferrari, M.D.    DATE OF BIRTH:  [birth year]   DATE OF PROCEDURE:  12/02/2006  DATE OF DISCHARGE:                               OPERATIVE REPORT   PROCEDURE:  Colonoscopy.   INDICATIONS:  Colon cancer.   ANESTHESIA:  Demerol 30 mg, Versed 3 mg.   DESCRIPTION OF PROCEDURE:  With the patient mildly sedated in the left  lateral decubitus position, the Pentax videoscopic colonoscope was  inserted in the rectum and passed under direct vision to the cecum  identified by the ileocecal valve and appendiceal orifice, both of which  were photographed. From this point, the colonoscope was slowly withdrawn  taking circumferential views of the colonic mucosa stopping at  approximately 20 cm from the anal verge at which point a polypoid area  was noted, although it did not have the appearance of a polyp, it was  raised.  I elected to photograph and biopsy it, thinking it may be the  anastomotic site from previous surgery. Once biopsied, the endoscope was  withdrawn taking circumferential views of the remaining colonic mucosa  stopping in the rectum which appeared normal on direct and retroflexed  view. The endoscope was straightened and withdrawn.  The patient's vital  signs and pulse oximeter remained stable.  The patient tolerated the  procedure well without apparent complication.   FINDINGS:  A raised area at 20 cm, possibly an anastomotic site, await  biopsy report.  The patient will call me for the results and follow-up  with me as needed as an outpatient.           ______________________________  Waverly Ferrari, M.D.     GMO/MEDQ  D:  12/02/2006  T:  12/02/2006  Job:  [id number]

## 2010-08-06 NOTE — Assessment & Plan Note (Signed)
[place]                            CARDIOLOGY OFFICE NOTE   MAFALDA, WASHA                        MRN:          [id number]  DATE:05/02/2008                            DOB:          16-Feb-1927    PRIMARY CARE PHYSICIAN:  Frankey Shown, MD   INTERVAL HISTORY:  Audrey [name] is a delightful 77 year old woman with history  of coronary artery disease, status post two-vessel bypass grafting in  December 2004.  She also has a history of hypertension, hyperlipidemia,  bilateral carotid stenoses, as well as elevated liver enzymes and  Lipitor.  She returns today for routine followup.   She is feeling very well.  She continues to work and be fairly active  without any chest pain or shortness of breath.  She has not had any  focal neurologic symptoms.  No problems with heart failure.  Her blood  pressure has been quite well controlled.   CURRENT MEDICATIONS:  1. Vitamin C.  2. Multivitamin.  3. Folic acid 1 mg a day.  4. Aspirin 81 a day.  5. Toprol 50 a day.  6. Telmisartan 20 mg a day.  7. Crestor 40 a day.  8. Zetia 10 a day.  9. Plavix 75 a day.   PHYSICAL EXAMINATION:  GENERAL:  She is well appearing in no acute  distress.  She ambulates around the clinic without any respiratory  difficulty.  VITAL SIGNS:  Blood pressure is 110/60, heart rate is 59, weight is 131,  which is stable for her.  HEENT:  Normal.  NECK:  Supple.  There is no JVD.  Carotids are 2+ bilaterally.  There  are soft bruits, right greater than left.  There is no lymphadenopathy  or thyromegaly.  CARDIAC:  PMI is nondisplaced.  Regular rate and rhythm.  No murmurs,  rubs, or gallops.  LUNGS:  Clear.  ABDOMEN:  Soft, nontender, and nondistended.  No hepatosplenomegaly.  No  bruits.  No masses.  Good bowel sounds.  EXTREMITIES:  Warm with no cyanosis, clubbing, or edema.  No rash.  NEUROLOGIC:  Alert and oriented x3.  Cranial nerves II through XII are  grossly intact.   Moves all 4 extremities without difficulty.  Affect is  very pleasant.   IMAGING:  Carotid ultrasound from earlier this week shows 60-79%  stenoses bilaterally.   ASSESSMENT AND PLAN:  1. Coronary artery disease is stable.  No evidence of ischemia.      Continue current therapy.  2. Hypertension, well controlled.  3. Hyperlipidemia.  She is due for lipids today.  Goal LDL is less      than 70.  We will also follow her liver panel closely.  4. Carotid artery stenosis.  She is asymptomatic.  She is on Plavix.      She will be due for a repeat carotid ultrasound in 6 months.   DISPOSITION:  We will see her back in clinic in 6 months.     Shaune Pascal. Bensimhon, MD  Electronically Signed    DRB/MedQ  DD: 05/02/2008  DT: 05/03/2008  Job #:  [id number]   cc:   Shanna Cisco., MD

## 2010-08-06 NOTE — Discharge Summary (Signed)
Audrey [name], Audrey [name] NO.:  [id number]   MEDICAL RECORD NO.:  [id number]          PATIENT TYPE:  INP   LOCATION:  1611                         FACILITY:  [facility]   PHYSICIAN:  Pietro Cassis. Alvan Dame, M.D.  DATE OF BIRTH:  October 20, 1926   DATE OF ADMISSION:  01/12/2007  DATE OF DISCHARGE:  01/15/2007                               DISCHARGE SUMMARY   ADMISSION DIAGNOSIS:  1. Osteoarthritis with previous left total hip placement.  2. Coronary artery disease.  3. Status post coronary artery bypass graft.  4. Hypercholesteremia .  5. Hypertension.  6. Colon cancer.   DISCHARGE DIAGNOSES:  1. Osteoarthritis.  2. Coronary artery disease status post coronary artery bypass graft.  3. Hypercholesteremia.  4. Hypertension.  5. Colon cancer.  6. Acute blood loss anemia.  7. Mild hyponatremia.   CONSULTATION:  None.   PROCEDURE:  Right total hip replacement.  Surgeon Dr. Paralee Cancel and  assistant Rowan Blase PA-C.  Components was a metal on polyethylene.   HISTORY OF PRESENT ILLNESS:  A 77 year old female with a history of  persistent progressive right hip pain secondary to osteoarthritis.  Does  have a known history of left total replacement 2007 which she did  extremely well.  Her right hip has been refractory to all conservative  treatments.  Presurgical prepared  by her PCP, Dr. Tempie Hoist.   LABORATORY DATA:  Labs preadmission CBC:  Hematocrit was 36.4.  She did  have some acute blood loss anemia, hematocrit dipped down to 21.8,  transfused 2 units.  Hematocrit came up to 29.3 at discharge.  C-met  upon admission:  No abnormalities, did have some postoperative  hyponatremia, dipped down to 132 but was stable.  UA had moderate  leukocyte esterase, only 0-2 white blood cells.   EKG borderline ECG with sinus bradycardia.   RADIOLOGY:  Chest two-view.  No acute cardiopulmonary findings.  There  were some minimal basilar scarring changes.   HOSPITAL COURSE:  The  patient underwent right total hip placement and  tolerated procedure well and was admitted to orthopedic floor.  She  remained afebrile throughout her course of stay.  Hemovac was pulled on  postop day #1, dressing was changed from postop day #2 going forward.  No significant drainage from the wound.  She did have some acute blood  loss anemia on October 23 for she was transfused 2 units packed red  blood cells and resolved with discharge hemoglobin of 10.2.  PT/OT  weightbearing as tolerated.  Started on postop day #1 as well as DVT  prophylaxis.  Pain was well-controlled.  She remained neurovascularly  intact of her right lower extremity throughout.  Day three she was  stable in improved condition ready for discharge home with home health  care PT and Lovenox teaching.   DISCHARGE DISPOSITION:  Discharged home, stable in improved condition  with home health care PT.   DISCHARGE INSTRUCTIONS:  1. Discharge physical therapy: Weightbearing as tolerated.  2. Discharge diet: Regular.  3. Discharge wound care: Keep dry.  4. Discharge followup: Follow up with Dr.  Olin at phone number [phone] in two weeks.   DISCHARGE MEDICATIONS:  1. Vicodin 5/325 1-2 p.o. q.4-6 h.  2. Lovenox 40 mg subcu q.24 x11 days.  3. Robaxin 500 mg p.o. q.6 h.  4. Enteric-coated aspirin 325 mg one p.o. daily x4 weeks after      Lovenox.  5. Iron 325 mg one p.o. t.i.d. x3 weeks.  6. Colace 100 mg p.o. b.i.d.  7. MiraLax 70 grams p.o. daily.  8. Folic acid 1 mg one p.o. q.a.m.  9. Metoprolol 50 mg p.o. q.a.m.  10.Crestor 40 mg p.o. q.a.m.  11.Micardis 20 mg p.o. q.a.m.  12.Vitamin C 500 mg p.o. q.a.m.  13.Multivitamin p.o. daily.     ______________________________  Audrey [name] Audrey [name], [place]      Pietro Cassis. Alvan Dame, M.D.  Electronically Signed    BLM/MEDQ  D:  02/02/2007  T:  02/03/2007  Job:  [id number]

## 2010-08-06 NOTE — Op Note (Signed)
Audrey [name] NO.:  [id number]   MEDICAL RECORD NO.:  [id number]          PATIENT TYPE:  INP   LOCATION:  1611                         FACILITY:  [facility]   PHYSICIAN:  Pietro Cassis. Alvan Dame, M.D.  DATE OF BIRTH:  [birth year]   DATE OF PROCEDURE:  01/12/2007  DATE OF DISCHARGE:                               OPERATIVE REPORT   PREOPERATIVE DIAGNOSIS:  Right hip osteoarthritis.   POSTOPERATIVE DIAGNOSIS:  Right hip osteoarthritis.   PROCEDURE:  Right total hip replacement.   COMPONENTS USED:  DePuy hip system size 50 Pinnacle cup, a 32+ 4 10  degrees face changing liner and a size 3 high offset Tri-Lock stem with  a 32 +1 ball.   SURGEON:  Pietro Cassis. Alvan Dame, M.D.   ASSISTANT:  Rowan Blase, PA   ANESTHESIA:  Spinal.   BLOOD LOSS:  300 mL.   DRAINS:  Times one.   COMPLICATIONS:  None.   INDICATIONS:  [name] is a 77 year old female patient of mine status  post a left total hip placement which she had done very well from.  She  had progressive degenerative change in right hip and wished to proceed  with surgical intervention based on results she had before.  I reviewed  with her the risks and benefits of hip replacement surgery as she been  through before including risks of infection, DVT, component failure, and  dislocation, need for revision surgery.  Consent was obtained.   PROCEDURE IN DETAIL:  The patient was brought to operative theater.  Once adequate anesthesia and preoperative antibiotics were administered.  the patient was positioned in left lateral decubitus position with the  right side up.  Right lower extremity was pre scrubbed and prepped and  draped in sterile fashion.  The lateral based incision was made for  posterior approach to the hip.  The iliotibial band and gluteus fascia  incised posteriorly.  Short external rotators taken down separate from  posterior capsule, an L capsulotomy was made.  Hip was dislocated and  neck osteotomy made  based off anatomic landmarks preoperative  templating.  Femoral canal was first prepared with starting reamer hand  reamer and then irrigation prevent fat emboli.  I then began broaching  and broached up to a size 3 with an excellent fit.  I packed the femur  and attempted the acetabulum.  Following acetabular exposure I performed  the labrectomy and began reaming with 43 reamer, carried this up to a 49  reamer with excellent bony preparation.  I then impacted 50 mm pinnacle  cup at 35 degrees of abduction and 15 to 20 degrees of forward flexion.  A single cancellous screw was utilized.  I checked this position with  the hip guide.  Trial reduction was carried out initially with a neutral  liner.  I felt that with the high offset neck and neutral liner she was  impinging her trochanter posteriorly with external rotation.  For this  reason I chose to use a +4 liner with 10 degree anteriorly.  There is no  evidence of any impingement.  The range of motion was excellent.  Her  leg lengths appeared equal to the down leg.   At this point all trial components removed and final hole eliminator  placed.  I irrigated the cup and placed the final 32+ 4 10 degrees high  wall liner with a lip anteriorly.  The final 3 offset Tri-Lock stem was  impacted level of the neck cut.  For that reason a 32  1 ball was used.  Again the hip was irrigated.  A medium Hemovac drain was placed deep.  I  reapproximated the posterior capsule to the superior leaflet using #1  Ethibond.  The remaining wound was closed with #1 Ethibond on the  iliotibial band and gluteal fashion #1 Vicryl, 2-0 Vicryl was used in  the subcu layer followed by running 4-0 Monocryl.  The patient's hip was  cleaned, dried, and dressed sterilely with Steri-Strips, dressing  sponges, tape.  She was brought recovery room in stable condition.      Pietro Cassis Alvan Dame, M.D.  Electronically Signed     MDO/MEDQ  D:  01/12/2007  T:  01/13/2007   Job:  [id number]

## 2010-08-06 NOTE — Assessment & Plan Note (Signed)
[facility]                            CARDIOLOGY OFFICE NOTE   AIDEL, BINGAMAN                        MRN:          [id number]  DATE:08/03/2007                            DOB:          March 28, 1926    PRIMARY CARE PHYSICIAN:  Dr. Hetty Blend.   INTERVAL HISTORY:  Audrey [name] is a delightful 77 year old woman with history  of coronary artery disease status post two vessel bypass grafting  December 2004.  She also has a history of hypertension, hyperlipidemia,  bilateral carotid stenoses as well as elevated liver enzymes on Lipitor.  She returns today for routine follow-up.   She says she is doing very well.  She denies any chest pain or shortness  of breath.  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  She  has not been very active with exercise.  Recently had a carotid  ultrasound which showed stable blockages.  Blood pressure has been well-  controlled.  She has not had any focal neurologic symptoms.   CURRENT MEDICATIONS:  1. Vitamin C.  2. Multivitamin.  3. Folic acid 1 mg a day.  4. Aspirin 81.  5. Toprol 50 mg a day.  6. Telmisartan 20 mg a day.  7. Crestor 40 mg a day.   PHYSICAL EXAMINATION:  GENERAL:  She is well-appearing, no acute  distress.  Ambulates around the clinic without any respiratory  difficulty.  VITAL SIGNS:  Blood pressure is 92/58, heart rate 73 weight  133.  HEENT:  Normal.  NECK:  Supple.  No JVD.  Carotids are 2+ bilaterally.  I am unable to  hear any bruits.  There is no lymphadenopathy or thyromegaly.  CARDIAC:  PMI is nondisplaced.  Regular rate and rhythm.  No murmurs,  rubs or gallops.  LUNGS:  Clear.  ABDOMEN:  Soft, nontender, nondistended.  No hepatosplenomegaly, no  bruits, no masses.  Good bowel sounds.  EXTREMITIES:  Warm with no  cyanosis, clubbing or edema.  No rash.  NEUROLOGICAL:  Alert and oriented x3.  Cranial nerves II-XII are intact.  Moves all four extremities without difficulty.   Affect is pleasant.   EKG shows normal sinus rhythm with rate of 73 with incomplete right  bundle branch block.  No acute ST-T wave abnormalities.   ASSESSMENT/PLAN:  1. Coronary artery disease is stable.  No evidence of ischemia.      Continue current therapy.  2. Hypertension well-controlled.  3. Hyperlipidemia.  We will check her lipids this week.  Goal LDL is      less than 70.  4. Carotid artery stenosis is asymptomatic.  I have given her a      prescription for Plavix for prophylaxis.  She will need a repeat      carotid ultrasound in one year to make sure these are stable.     Shaune Pascal. Bensimhon, MD  Electronically Signed    DRB/MedQ  DD: 08/03/2007  DT: 08/03/2007  Job #: [id number]   cc:   Shanna Cisco., MD

## 2010-08-06 NOTE — H&P (Signed)
NAMEELVIE, KARLSON NO.:  [id number]   MEDICAL RECORD NO.:  [id number]          PATIENT TYPE:  INP   LOCATION:  NA                           FACILITY:  [facility]   PHYSICIAN:  Pietro Cassis. Alvan Dame, M.D.  DATE OF BIRTH:  [birth year]   DATE OF ADMISSION:  01/12/2007  DATE OF DISCHARGE:                              HISTORY & PHYSICAL   PROCEDURES:  Right total hip arthroplasty.   CHIEF COMPLAINT:  Right hip and groin pain.   HISTORY OF PRESENT ILLNESS:  A 77 year old female with a history of  persistent progressive right hip and groin pain secondary to  osteoarthritis with a history of left total hip replacement in 2007 for  which she did extremely well.  Her right hip has been refractory to all  conservative treatments.  She was presurgically assessed by her primary  care physician, Dr. Tempie Hoist.   PAST MEDICAL HISTORY:  1. Osteoarthritis with previous left total hip replacement.  2. Coronary artery disease.  3. Status post coronary artery bypass graft.  4. Hypercholesteremia.  5. Hypertension.  6. Colon cancer.   PAST SURGICAL HISTORY:  1. Colon surgery in 2003.  2. Heart CABG in 2004.  3. Left total hip replacement in 2007.   FAMILY HISTORY:  Heart disease, diabetes.   SOCIAL HISTORY:  She is widowed.  Primary caregiver after surgery will  be daughter.   ALLERGIES:  NO KNOWN DRUG ALLERGIES.   MEDICATIONS:  1. Micardis 20 mg p.o. daily.  2. Metoprolol 50 mg p.o. daily.  3. Folic acid 1 mg p.o. daily.  4. Crestor 40 mg p.o. daily.  5. Aspirin 81 mg p.o. daily.  6. Vitamin C 500 mg p.o. daily.  7. Multivitamin p.o. daily.   REVIEW OF SYSTEMS:  See HPI.   PHYSICAL EXAMINATION:  VITAL SIGNS:  Pulse 72, respirations 18, blood  pressure 110/64.  GENERAL:  She is awake, alert and oriented, well-developed, well-  nourished, in no acute distress.  NECK:  Supple.  No carotid bruits.  CHEST:  Lungs clear to auscultation bilaterally.  BREASTS:   Deferred.  HEART:  Regular rate and rhythm without murmurs.  ABDOMEN:  Soft, nontender, bowel sounds present.  GENITOURINARY:  Deferred.  EXTREMITIES:  Right hip has increased pain with range of motion.  SKIN:  No cellulitis.  NEUROLOGIC:  Intact distal sensibilities.   LABORATORY DATA:  EKG, chest x-ray are pending presurgical testing.   IMPRESSION:  1. Osteoarthritis.  2. Coronary artery disease.  3. Status post coronary artery bypass graft.  4. Hypertension.  5. Hypercholesteremia.  6. Colon cancer.   PLAN OF ACTION:  Right total hip arthroplasty on January 12, 2007 at  [facility] by surgeon, Dr. Paralee Cancel.  Risks and  complications were discussed.   Postoperative medications including Lovenox Robaxin, aspirin, iron,  Colace, MiraLax were provided at the time of history and physical.  Pain  medicines will be provided at the time of surgery.     ______________________________  Carlean Jews Collene Mares, [place]      Pietro Cassis. Alvan Dame, M.D.  Electronically Signed    BLM/MEDQ  D:  12/31/2006  T:  12/31/2006  Job:  [id number]   cc:   Shanna Cisco., MD  [place]  [place] [address]

## 2010-08-06 NOTE — Assessment & Plan Note (Signed)
[place]                            CARDIOLOGY OFFICE NOTE   Audrey [name], Audrey [name]                        MRN:          [id number]  DATE:02/03/2007                            DOB:          10/21/1926    PRIMARY CARE PHYSICIAN:  Dr. Hetty Blend.   INTERVAL HISTORY:  Audrey [name] is a delightful 77 year old woman with a  history of coronary artery disease status post 2-vessel bypass grafting  in December of 2004.  She also has a history of hypertension,  hyperlipidemia, and bilateral carotid stenosis, as well as elevated  liver function tests on Lipitor.  She returns today for routine  followup.   Three weeks ago she had her right hip replaced with Dr. Alvan Dame.  She  tolerated the surgery quite well.  She is up and walking.  She does use  a cane at some times, but is having no problems.  She denies any chest  pain or shortness of breath.  She had a carotid ultrasound, which showed  60-79% carotid stenosis bilaterally and underwent CTA, which showed 50-  60% stenosis of a distal right internal carotid artery.  The left  appeared normal.  She has not had any neurologic symptoms.   PHYSICAL EXAM:  She is in no acute distress.  Ambulates around the  clinic without any respiratory difficulty.  She looks younger than her  stated age.  Blood pressure is 87/50 on the left, 98/50 in the right.  Heart rate is  70.  Weight is 124.  HEENT:  Normal.  NECK:  Supple.  No JVD.  Carotids are 2+ bilaterally with no audible  bruits.  There is no lymphadenopathy or thyromegaly.  CARDIAC:  PMI is not displaced.  She had a regular rate and rhythm.  No  murmurs, rubs, or gallops.  LUNGS:  Clear.  ABDOMEN:  Soft, nontender, nondistended.  No hepatosplenomegaly.  No  bruits.  No masses.  Good bowel sounds.  EXTREMITIES:  Warm with no cyanosis, clubbing, or edema.  No rash.  NEURO:  Alert and oriented x3.  Cranial nerves 2-12 are intact.  Moves  all 4 extremities without  difficulty.  Affect is pleasant.   EKG shows normal sinus rhythm at a rate of 65 with incomplete right  bundle branch block.  No significant ST-T wave abnormalities.   ASSESSMENT AND PLAN:  1. Coronary artery disease status post bypass surgery.  This is      stable.  Continue current therapy.  2. Hypertension, well controlled.  In fact, her blood pressure is      slightly low today.  She may have some subclavian stenosis on the      left accounting for her differential pressures.  Continue with the      current therapy.  3. Hyperlipidemia. Recheck lipids this week.  Goal LDL is less than      70.  4. Carotid artery stenosis.  She will be due for a yearly followup.   DISPOSITION:  Return to clinic in 6 months for routine followup.     Shaune Pascal.  Bensimhon, MD  Electronically Signed    DRB/MedQ  DD: 02/03/2007  DT: 02/04/2007  Job #: [id number]

## 2010-08-09 NOTE — Assessment & Plan Note (Signed)
[place]                              CARDIOLOGY OFFICE NOTE   TABYTHA, ROHS                        MRN:          [id number]  DATE:10/06/2005                            DOB:          [birth year]    PATIENT IDENTIFICATION:  Audrey [name] is a delightful 77 year old woman who  returns for routine follow-up.   PROBLEM LIST:  1.  Coronary artery disease including left main disease.      1.  Status post two vessel bypass surgery by Dr. Servando Snare in December of          2004 with a left internal mammary artery to the left anterior          descending and a saphenous vein graft to the circumflex.  2.  Hypertension.  3.  Hyperlipidemia.      1.  Elevated liver function tests on Lipitor.  Unable to tolerate the          Vytorin secondary to nausea.  4.  Bilateral carotid artery stenosis.  5.  Carotid Dopplers June 2006 showed 60-79% internal carotid artery      stenosis bilaterally being followed by Dr. Albertine Patricia in [facility].  6.  Severe osteoarthritis pending possible left hip replacement.   CURRENT MEDICATIONS:  1.  Vitamin C.  2.  Toprol XL 50.  3.  Multivitamin.  4.  Micardis 20 mg a day.  5.  Aspirin 81 mg a day.  6.  Fish oil.  7.  Glucosamine.   ALLERGIES:  Once again, she is intolerant to VYTORIN due to nausea, LIPITOR  causes increased LFTs.   INTERVAL HISTORY:  [name] returns for routine follow-up.  Unfortunately,  she recently had multiple problems with statin medications and was recently  stopped on her Lipitor due to elevated liver function tests.  Otherwise, she  is doing relatively well.  She denies any chest pain or shortness of breath,  though she has been severely limited by left hip pain and is pending  possible left hip replacement with Dr. Alvan Dame.  She denies any claudication.   PHYSICAL EXAMINATION:  GENERAL:  She is elderly, generally well-appearing,  no acute distress.  Respirations are unlabored.  VITAL SIGNS:  Blood  pressure is 120/62 with a heart rate of 64.  Weight is  126.  HEENT:  Sclerae anicteric.  EOMI.  There are no xanthelasmas.  Mucous  membranes are moist.  NECK:  Supple.  There is no JVD.  Carotids are 2+ bilaterally.  She has a  right-sided bruit.  CARDIAC:  She has a regular rate and rhythm.  No murmurs, rubs, or gallops.  LUNGS:  Clear to auscultation.  ABDOMEN:  Soft, nontender, nondistended.  There is no hepatosplenomegaly.  No bruits.  No masses.  EXTREMITIES:  Warm with no clubbing, cyanosis, edema.  NEUROLOGIC:  She is alert and oriented x3.  Cranial nerves II-XII are  intact.  Her affect is bright.   ASSESSMENT AND PLAN:  1.  Coronary artery disease.  She is doing quite well after her bypass  surgery without any evidence of recurrent ischemia.  Given that she is      relatively limited due to her hip pain I do think it is reasonable to      proceed with a preoperative Cardiolite just to make sure there is not a      high degree of ischemia and we have gone ahead and ordered this.      Otherwise, we will continue her current regimen and she certainly needs      to maintain her beta blocker in the perioperative period.  2.  Hyperlipidemia.  Unfortunately, she has been having a hard time      tolerating statins due to elevated LFTs and gastrointestinal upset.  We      will try her on low dose pravastatin 10 mg a day to see if she can      tolerate this.  We will follow her LFTs closely.  3.  Hypertension, well controlled.  4.  Coronary artery disease.  This is followed by Dr. Albertine Patricia.  Will check a      repeat ultrasound in one year.   DISPOSITION:  Return to clinic in several months for routine follow-up.                                Shaune Pascal. Bensimhon, MD    DRB/MedQ  DD:  10/06/2005  DT:  10/07/2005  Job #:  [id number]   cc:   Pietro Cassis Alvan Dame, MD

## 2010-08-09 NOTE — Op Note (Signed)
NAMESHANTOYA, LAME NO.:  [id number]   MEDICAL RECORD NO.:  [id number]          PATIENT TYPE:  AMB   LOCATION:  ENDO                         FACILITY:  [facility]   PHYSICIAN:  Waverly Ferrari, M.D.    DATE OF BIRTH:  1927/03/18   DATE OF PROCEDURE:  10/01/2004  DATE OF DISCHARGE:                                 OPERATIVE REPORT   PROCEDURE:  Colonoscopy.   INDICATIONS:  Follow-up OF colon cancer.   ANESTHESIA:  Demerol 50 mg, Versed 5 mg.   PROCEDURE:  With the patient mildly sedated in the left lateral decubitus  position, the Olympus videoscopic colonoscope was inserted into the rectum  and passed under direct vision to the cecum, identified by appendiceal  orifice and ileocecal valve, both of which were photographed. From this  point the colonoscope was slowly withdrawn taking circumferential views of  colonic mucosa, stopping at approximately 60 cm from the anal verge, at  which point we saw an area that appeared to be somewhat friable and  inflamed, possibly granulation tissue from the patient's previous surgical  anastomotic site.  This was photographed and biopsied.  We then withdrew  further taking circumferential views of the remaining colonic mucosa as we  withdrew all the way to the rectum, stopping at 20 cm, where we found a  semicircumferential raised area.  It had two smaller abd two larger  elevations that looked to be benign normal mucosa, although they could have  been polypoid tissue.  I elected only to biopsy at this point with cold  biopsies and photographed this area.  The endoscope was then withdrawn to  the rectum, which appeared normal on direct and retroflexed view.  The  endoscope was straightened and withdrawn.  The patient's vital signs and  pulse oximetry remained stable.  The patient tolerated procedure well  without apparent complications.   FINDINGS:  What appears to be granulation tissue at approximately 60 cm from  the anal verge  and raised areas at 20 cm from anal verge, both of these  areas were biopsied.  We will await biopsy report.  The patient will call me  for results and follow up with me as an outpatient.  Otherwise, this was an  unremarkable colonoscopic examination for follow-up of adenocarcinoma of the  colon resected the 2003.       GMO/MEDQ  D:  10/01/2004  T:  10/01/2004  Job:  [id number]   cc:   Shanna Cisco., M.D.  [address] [place]  [place] [address]  Fax: [phone]

## 2010-08-09 NOTE — Op Note (Signed)
NAME:  Audrey [name], Audrey [name]                           ACCOUNT NO.:  [id number]   MEDICAL RECORD NO.:  [id number]                   PATIENT TYPE:  INP   LOCATION:  2313                                 FACILITY:  [facility]   PHYSICIAN:  Lilia Argue. Servando Snare, M.D.            DATE OF BIRTH:  Nov 24, 1926   DATE OF PROCEDURE:  02/22/2003  DATE OF DISCHARGE:                                 OPERATIVE REPORT   PREOPERATIVE DIAGNOSES:  Critical left main obstruction.   POSTOPERATIVE DIAGNOSES:  Critical left main obstruction.   OPERATION PERFORMED:  Coronary artery bypass grafting times two with left  internal mammary to the left anterior descending coronary artery and  reversed saphenous vein graft to the obtuse marginal coronary artery with  endo vein harvesting.   SURGEON:  Lilia Argue. Servando Snare, M.D.   ASSISTANT:  Suzzanne Cloud, P.A.   ANESTHESIA:  General.   INDICATIONS FOR PROCEDURE:  The patient is a 77 year old female who noted a  several month history of substernal chest pain with shortness of breath  associated with exertion.  A cardiac catheterization was recommended by Junious Silk, M.D., which demonstrated at least an 80% left main  obstruction, proximal 80 to 90% LAD obstruction, proximal circumflex, a  small right coronary artery without significant disease.  Overall, the  patient's vessels were diffusely small.  Off pump coronary artery bypass  grafting had been contemplated, but because of the small size of both the  mammary and the LAD, and circumflex, it was felt a more satisfactory  anastomosis could be obtained while on pump.  The patient also was found to  have 60 to 80% carotid stenosis which was asymptomatic by Dopplers.  The  risks and options of the procedure were discussed with the patient in  detail, who was agreeable and signed informed consent.   DESCRIPTION OF PROCEDURE:  With Swann-Ganz and arterial line monitors in  place, the patient underwent general endotracheal  anesthesia without  incident.  The skin of the chest and legs was prepped with Betadine and  draped in the usual sterile manner.  Using endoscopic vein harvesting with a  Guidant system, vein was harvested from the right thigh and was of adequate  quality and caliber.  A median sternotomy was performed.  The left internal  mammary artery was dissected down as a pedicle graft.  The distal artery was  divided and had good free flow, was slightly small mammary but approximately  the size of her small coronary arteries.  As noted, off pump bypass had been  contemplated but was abandoned because of the small size of her native  coronary vessels.  The patient was systemically heparinized.  The ascending  aorta and the right atrium were cannulated in the aortic root.  A bent  cardioplegia needle was introduced into the ascending aorta.  The patient  was placed on cardiopulmonary bypass at 2.4L per minute  per meter squared.  Sites for anastomosis were selected and dissected out of the epicardium and  surrounding fat.  The patient's body temperature was allowed to drift down.  An aortic crossclamp was applied.  500 mL of cold blood potassium  cardioplegia was administered with rapid diastolic arrest of the heart.  Myocardial septal temperature was monitored throughout the crossclamp  period.   Attention was turned first to the circumflex coronary artery which was  opened, was approximately 1.3 to 1.4 mm in size, a very thin-walled artery.  Using a running 7-0 Prolene, distal anastomosis was performed.  Attention  was turned to the left anterior descending coronary artery, between the mid  and distal third, the vessel was opened, was approximately 1.3 to 1.4 mm in  size.  Using a running 8-0 Prolene, the left internal mammary artery was  anastomosed to the left anterior descending coronary artery.  Blood flow was  flashed down the mammary artery and anastomosis was intact.  The bulldog was  placed  on the mammary artery and the proximal anastomosis of the vein to the  ascending aorta was carried out with aortic cross-clamp still in place.  With the anastomosis completed, the ascending aorta and vein was allowed to  deair.  The aortic cross-clamp was removed  Total crossclamp time was 51  minutes.  The patient initially was in heart block requiring AV pacing;  however, prior to coming off bypass converted to a sinus rhythm.  The  patient was ventilated and weaned from cardiopulmonary bypass without  difficulty and remained hemodynamically stable, was decannulated in the  usual fashion.  Protamine sulfate was administered.  With the operative  field hemostatic, two atrial and two ventricular pacing wires were applied.  Graft markers were applied.  A left pleural tube and two mediastinal tubes  were left in place.  Sternum was closed with #6 stainless steel wire.  Fascia closed with interrupted 0 Vicryl, running 3-0 Vicryl in the  subcutaneous tissues and 4-0 subcuticular stitch in the skin edges.  Dry  dressings were applied.  Sponge and needle counts were reported as correct  at the completion of the procedure.  The patient tolerated the procedure  without obvious complication.  Because of low hematocrit prior to surgery  and while on pump, she did require blood bank packed red blood cells.                                               Lilia Argue Servando Snare, M.D.    Mcneil Sober  D:  02/22/2003  T:  02/22/2003  Job:  [id number]   cc:   Junious Silk, M.D. [facility]

## 2010-08-09 NOTE — Discharge Summary (Signed)
Audrey [name], Audrey [name]                           ACCOUNT NO.:  [id number]   MEDICAL RECORD NO.:  [id number]                   PATIENT TYPE:  INP   LOCATION:  2017                                 FACILITY:  [facility]   PHYSICIAN:  Lilia Argue. Servando Snare, M.D.            DATE OF BIRTH:  Jul 11, 1926   DATE OF ADMISSION:  02/21/2003  DATE OF DISCHARGE:  02/27/2003                                 DISCHARGE SUMMARY   ADMISSION DIAGNOSES:  1. Dyspnea on exertion which has progressed along with substernal chest     pain.  2. Coronary artery disease.   PAST MEDICAL HISTORY:  1. Hypertension.  2. Hyperlipidemia.  3. History of colon cancer status post resection in June 2004 by Dr. Fanny Skates.   ALLERGIES:  No known drug allergies.   DISCHARGE DIAGNOSES:  Two vessel coronary artery disease status post  coronary artery bypass grafting with endoscopic vein harvest.   BRIEF HISTORY:  The patient is a 77 year old female who recently saw Dr.  Joni Fears approximately two weeks prior to admission due to onset of left  chest wall tenderness with vesicles present which were consistent with new  onset shingles. The shingles were treated; however, while seeing Dr.  Joni Fears, the patient also mentioned that she had been experiencing dyspnea  on exertion since August 2004 along with substernal chest pain. These  symptoms were usually noted when she was climbing stairs. The pain was  relieved with rest. The patient denied diaphoresis or associated nausea. The  symptoms had progressed steadily since August and vary in frequency. The  patient continued to have pain in moderate degree with other activities and  had limited her activity level. The patient underwent an exercise Cardiolite  during which she did experience chest tightness; however, there was no  electrocardiographic changes, and the Cardiolite images were thought to be  normal. The patient had no history of myocardial infarction in the past.  Cardiac catheterization was performed on February 21, 2003 which showed two-  vessel coronary artery disease. Dr. Servando Snare was consulted, and after  evaluating the patient, it was his opinion that the patient should undergo  coronary artery bypass grafting.   HOSPITAL COURSE:  The patient was admitted on February 21, 2003 for cardiac  catheterization by Dr. Elta Guadeloupe Pulsipher. Cardiac catheterization revealed  normal left ventricular ejection fraction and two-vessel coronary artery  disease. Dr. Servando Snare was consulted, and it was his opinion that the patient  should undergo coronary artery bypass grafting. The patient was taken to the  OR on February 22, 2003 for coronary artery bypass grafting x2 with  endoscopic vein harvest of the right thigh. The left internal mammary artery  was grafted to the LAD and saphenous vein was grafted to the obtuse marginal  coronary artery. The patient tolerated the procedure well and was  hemodynamically stable immediately postoperatively. The patient was  extubated without problems.  The patient woke up from anesthesia neurological  intact. The patient began cardiac rehab on postoperative day #2 and  tolerated this well. The patient has progressed as expected postoperatively.  On the day prior to discharge on postoperative day #4, the patient was doing  well with no issues or concerns.   PHYSICAL EXAMINATION:  VITAL SIGNS:  Vital signs were stable. The patient  was afebrile with a blood pressure of 127/70, heart rate of 77, and an  oxygen saturation of 90% on room air.  CARDIAC:  On physical exam, cardiac was regular rate and rhythm with no  murmurs. Telemetry showed normal sinus rhythm with rare PVCs.  LUNGS:  Clear to auscultation bilaterally.  ABDOMEN:  Soft, nontender, nondistended, positive bowel sounds.  EXTREMITIES:  There is no edema with 2+ posterior tibial pulses. The  incisions were stable, clean, dry, and intact.   The patient is stable at this  time and is felt to be ready for discharge.   LABORATORY DATA:  CBC on February 25, 2003:  White count 7.4, hemoglobin  12.1, hematocrit 36, platelets 178. BMP on February 25, 2003:  Sodium 139,  potassium 3.5, BUN 13, creatinine 0.9, glucose 92.   CONDITION ON DISCHARGE:  Improved.   INSTRUCTIONS:   MEDICATIONS:  1. Micardis 40 mg p.o. q.d.  2. Zocor 20 mg p.o. q.h.s.  3. Vitamin C 500 mg q.d.  4. Multivitamin one p.o. q.d.  5. Aspirin 325 mg one p.o. q.d.  6. Toprol-XL 25 mg one p.o. q.d.  7. Folic acid 1 mg one p.o. q.d.  8. Colace 100 mg two tablets p.o. q.d.  9. Ultram 50 mg one to two p.o. q.4-6h. p.r.n. pain.   ACTIVITY:  No driving. No heavy lifting, pulling, or pushing. The patient is  to continue daily breathing and walking exercises.   DIET:  Low salt, low fat, low cholesterol.   WOUND CARE:  The patient is to shower daily and clean the incisions with  soap and water. The patient is refrain from putting any creams or lotions on  the incision.   SPECIAL INSTRUCTIONS:  If the incisions become red, swollen, or painful or  drain, or the if the patient has a fever of 101 degrees Fahrenheit, she is  to call the CVTS office.   FOLLOWUP APPOINTMENT:  The patient should follow up with Dr. Elta Guadeloupe Pulsipher  within two weeks of discharge from the hospital. That appointment will be  established by [facility]. The patient has a followup appointment  with Dr. Servando Snare three weeks after discharge. CVTS office will call the  patient with that appointment.      Leta Baptist, PA                      Lilia Argue. Servando Snare, M.D.    AY/MEDQ  D:  02/26/2003  T:  02/27/2003  Job:  [id number]   cc:   Shanna Cisco., M.D.  [address] [place]  [place] [address]  Fax: [phone]

## 2010-08-09 NOTE — Assessment & Plan Note (Signed)
[name]                            CARDIOLOGY OFFICE NOTE   Audrey, [name]                        MRN:          [id number]  DATE:06/19/2006                            DOB:          05-29-1926    PRIMARY CARE PHYSICIAN:  Dr. Hetty Blend.   INTERVAL HISTORY:  Audrey [name] is a delightful 77 year old woman with a  history of coronary artery disease, status post 2-vessel bypass surgery  in December of 2004 with a LIMA to the LAD and a saphenous vein graft to  the circumflex, who presents for routine followup.  She underwent last  hip replacement in August without any difficulty.  Myoview  preoperatively showed an EF of 87% with no significant ischemia or  scarring.   PAST MEDICAL HISTORY:  Notable for hypertension, hyperlipidemia,  bilateral carotid stenosis, and elevated liver function tests on  Lipitor.   Functionally, she is doing quite well.  She stays very active.  She  denies any chest pain or shortness of breath and no heart failure  symptoms, no claudication.  She did have a repeat ultrasound last month  of her carotids which showed a bilateral 60%-79% stenoses which were  stable.   CURRENT MEDICATIONS:  1. Vitamin C.  2. Multivitamin.  3. Folic acid.  4. Micardis 20 a day.  5. Crestor 20 a day.  6. Aspirin 81.  7. Metoprolol 50 a day.   PHYSICAL EXAMINATION:  She ambulates around the clinic without any  respiratory difficulty.  She looks younger than her stated age.  Blood pressure is 120/51.  Heart rate is 66 beats per minute, weight is  128.  HEENT:  Sclerae anicteric, EOMI, there are no xanthelasmas.  Mucous  membranes are moist, oropharynx is clear.  NECK:  Supple, there is no JVD.  Carotids are 2+ bilaterally without any  bruits.  There is no lymphadenopathy or thyromegaly.  CARDIAC:  Regular rate and rhythm, no murmurs, rubs, or gallops.  LUNGS:  Clear.  ABDOMEN:  Soft, nontender, nondistended, no hepatosplenomegaly,  no  bruits, no masses, good bowel sounds.  Abdominal aortic impulse does not  appear widened.  EXTREMITIES:  Warm with no cyanosis, clubbing, or edema, no rash.  NEURO:  Alert and oriented x3.  Cranial nerves II-XII are intact.  Moves  all 4 extremities without difficulty.  Affect is pleasant.   EKG  shows normal sinus rhythm at a rate of 66 with a chronic right  bundle branch block, no significant ST-T wave abnormalities.   ASSESSMENT/PLAN:  1. Coronary artery disease.  This is quite stable.  She is on a good      medical regimen.  Will continue current therapy.  2. Hypertension.  Well controlled.  3. Hyperlipidemia.  She is tolerating her Crestor well.  We will check      a fasting lipid panel, make sure her LDL is 70 or below.  4. Wide pulse pressure.  I do not hear any evidence of aortic      insufficiency on her exam, but we will check an echocardiogram to  make sure.  5. Coronary artery stenosis.  She will be due for a repeat ultrasound      in 6 months.  6. Routine screening.  Given her vascular disease, we will screen her      with an ultrasound to make sure she does not have a triple A.   DISPOSITION:  Followup in 6 months.     Shaune Pascal. Bensimhon, MD  Electronically Signed    DRB/MedQ  DD: 06/19/2006  DT: 06/19/2006  Job #: [id number]   cc:   Shanna Cisco., MD

## 2010-08-09 NOTE — Op Note (Signed)
Audrey [name], Audrey [name] NO.:  [id number]   MEDICAL RECORD NO.:  [id number]          PATIENT TYPE:  INP   LOCATION:  0002                         FACILITY:  [facility]   PHYSICIAN:  Pietro Cassis. Alvan Dame, M.D.  DATE OF BIRTH:  Aug 01, 1926   DATE OF PROCEDURE:  11/19/2005  DATE OF DISCHARGE:                                 OPERATIVE REPORT   PREOPERATIVE DIAGNOSIS:  Left hip osteoarthritis.   POSTOPERATIVE DIAGNOSIS:  Left hip osteoarthritis.   PROCEDURE:  Left total hip replacement.   COMPONENTS USED:  DePuy total hip system with size 48 pinnacle cup, two  cancellous bone screws, 48 28 neutral metal marathon liner, a SROM 20 x 15  36 blunt standard stem, 20 D small sleeve, 28 +0 ball.   SURGEON:  Pietro Cassis. Alvan Dame, M.D.   ASSISTANT:  Bobbye Morton   ANESTHESIA:  Spinal.   BLOOD LOSS:  200 mL.   DRAINS:  None.   INDICATIONS:  Ms. Daily is a 77 year old female who was followed for left hip  pain.  She had progressive discomfort despite attempts at conservative  measures.  Her quality of life had diminish and she wished to proceed with  surgical intervention.  We discussed the risks and benefits in hip  replacements surgery including infection, DVT, dislocation, component  failure, need for revision, consent was obtained.   PROCEDURE IN DETAIL:  The patient was brought to operative theater.  Once  adequate anesthesia and preoperative antibiotics, 1 gram of Ancef, were  administered the patient was positioned in the right lateral decubitus  position with a left side up, left lower extremity prepped and draped in  sterile fashion.  Lateral based incision was made for posterior approach to  the hip.  Short external tears taken down separate from the posterior  capsule.  An L capsulotomy was made with a posterior capsule saved for later  anatomic repair.  Acetabular exposure was obtained routinely following  dislocation and neck osteotomy.  Note that neck osteotomy was  made with the  use of the hip guide based on the tip of the trochanter and center of the  femoral head.   Acetabular reaming was carried out with a 43 reamer carried up to 47 reamer  with good excellent bony bed preparation.  There were cystic changes noted  the acetabulum that were grafted.  I then impacted a 48 mm pinnacle cup with  40 degrees of abduction, 20 degrees of forward flexion.  Given this  position, I went ahead and placed two cancellous bone screws in the  acetabulum to further secure the initial scratch fit.  Given this position  the final central hole eliminator was placed in the final marathon liner  position.   Following this, attention was directed to the femur.  Femoral preparation  was carried out per protocol for the SROM system with initial drilling,  irrigating the canal to prevent fat emboli.  I then actually reamed distally  to 15 and 15-1/2 to three quarters the way down.  Approximately I reamed  only to the 20 B small  which had good purchase.   I then milled proximally to B small.  All this was based off the tip of the  trochanter.  Trial reduction was carried out.  The patient was noted to have  20 to 25 degrees of femoral anteversion.  There was no need to add any more  based on the position of the sleeve.   The trial reduction revealed that the patient's hip was very stable,  tolerated internal rotation to at least 80 degrees of neutral abduction, 90  degrees of flexion, stable in the sleep position as well as with extension  and external rotation.  There was no evidence of extensive tightness  laterally with a standard stem.  Leg lengths appeared equal to that of the  down leg following this trial reduction.   There was about a millimeter of shuck in extension.  Given all this the  final components brought to the field.  Trial components removed, the final  B small sleeve, 20 B small sleeve was impacted the level of the neck cut  which was trimmed up  using oscillating saw based on the trial reduction.  The final 20 x 15, 36 standard stem was then impacted in the neutral  position using the etch marks on the component.   Trial reduction was carried out just to make sure everything was stable.  It  was.  I then placed and impacted a 28 +0 ball onto a clean and dry trunnion.  The hip was reduced, irrigated throughout the case.  Again this point  hemostasis carried out as necessary.  I did reapproximate the posterior  capsular leaflet to the superior leaflet with #1 Ethilon.  The remaining  wound was closed in layers with #1 Vicryl on the gluteal fascia and  iliotibial band.  2-0 Vicryl were used on the subcu tissue and a running 4-0  Monocryl on the skin.  The skin was cleaned, dried and dressed sterilely  with Steri-Strips, dressing sponge and tape.  The patient was then brought  to recovery in stable condition.      Pietro Cassis Alvan Dame, M.D.  Electronically Signed     MDO/MEDQ  D:  11/19/2005  T:  11/20/2005  Job:  [id number]

## 2010-08-09 NOTE — Procedures (Signed)
[facility]  Patient:    CATHELEEN, SPRINKLES Visit Number: [id number] MRN: [id number]          Service Type: END Location: ENDO Attending Physician:  Jim Desanctis Dictated by:   Jim Desanctis, M.D. Proc. Date: 08/13/01 Admit Date:  08/18/2001   CC:         Dellis Filbert C. Huey Bienenstock, M.D.   Procedure Report  PROCEDURE:  Colonoscopy.  INDICATION FOR PROCEDURE:  Colon polyps.  ANESTHESIA:  Demerol 10, Versed 2 mg.  DESCRIPTION OF PROCEDURE:  With the patient mildly sedated in the left lateral decubitus position, the Olympus videoscopic colonoscope was inserted in the rectum and passed under direct vision to the cecum identified by the ileocecal valve and appendiceal orifice both of which were photographed. From this point, the colonoscope was slowly withdrawn taking circumferential views of the entire colonic mucosa, stopping in the rectum of approximately 20-25 cm from the anal verge at which point two polyps were seen, one was smaller and the other was somewhat larger on a thick stalk. Both were photographed and both were removed using snare cautery technique on a setting of 20:20 blended current. Both were retrieved and placed in one jar. The scope was then withdrawn all the way to the rectum which appeared normal in direct and retroflexed view. The endoscope was straightened and withdrawn. The patients vital signs and pulse oximeter remained stable. The patient tolerated the procedure well without apparent complications.  FINDINGS:  Polyps at approximately 20-25 cm from the anal verge. Await biopsy report. The patient will call me for results and followup with me as an outpatient. Dictated by:   Jim Desanctis, M.D. Attending Physician:  Jim Desanctis DD:  08/18/01 TD:  08/19/01 Job: [id number]

## 2010-08-09 NOTE — Procedures (Signed)
[facility]  Patient:    Audrey [name], Audrey [name] Visit Number: [id number] MRN: [id number]          Service Type: END Location: ENDO Attending Physician:  Jim Desanctis Dictated by:   Jim Desanctis, M.D. Proc. Date: 08/18/01 Admit Date:  08/18/2001   CC:         Dellis Filbert C. Huey Bienenstock, M.D.   Procedure Report  PROCEDURE:  Upper endoscopy.  INDICATION FOR PROCEDURE:  GERD.  ANESTHESIA:  Demerol 40, Versed 4 mg.  DESCRIPTION OF PROCEDURE:  With the patient mildly sedated in the left lateral decubitus position, the Olympus videoscopic endoscope was inserted in the mouth and passed under direct vision through the esophagus which appears normal into the stomach. The fundus, body, antrum, duodenal bulb and second portion of the duodenum all appeared normal. From this point, the endoscope was slowly withdrawn taking circumferential views of the entire duodenal mucosa until the endoscope was then pulled back into the stomach, placed in retroflexion to view the stomach from below. The endoscope was then straightened and withdrawn taking circumferential views of the remaining gastric and esophageal mucosa which otherwise appeared normal. The patients vital signs and pulse oximeter remained stable. The patient tolerated the procedure well without apparent complications.  FINDINGS:  Essentially negative endoscopic examination.  PLAN:  Proceed to colonoscopy. Dictated by:   Jim Desanctis, M.D. Attending Physician:  Jim Desanctis DD:  08/18/01 TD:  08/19/01 Job: [id number]

## 2010-08-09 NOTE — Discharge Summary (Signed)
Audrey, [name] NO.:  [id number]   MEDICAL RECORD NO.:  [id number]          PATIENT TYPE:  INP   LOCATION:  1615                         FACILITY:  [facility]   PHYSICIAN:  Audrey [name], M.D.  DATE OF BIRTH:  04-11-1926   DATE OF ADMISSION:  11/19/2005  DATE OF DISCHARGE:  11/23/2005                                 DISCHARGE SUMMARY   ADMITTING DIAGNOSES:  1. Severe osteoarthritis both hips, more so on the left than right.  2. Coronary artery disease.  3. Status post coronary artery bypass graft.  4. Hypercholesterolemia.  5. Hypertension.   DISCHARGE DIAGNOSES:  1. Severe osteoarthritis both hips, more so on the left than right.  2. Coronary artery disease.  3. Status post coronary artery bypass graft.  4. Hypercholesterolemia.  5. Hypertension.  6. Postoperative anemia, treated with transfusion.   OPERATION:  On August 29, 2049m the patient underwent left total hip  replacement arthroplasty.  Audrey Chol, PA-C assisted.   BRIEF HISTORY:  This 77 year old lady with bilateral hip pain, having more  and more problems pain in the left hip.  Conservative measures have been  exercised with the patient, with anti-inflammatories, walking devices, and  walking assistant devices.  She has difficulty getting about, essentially is  a prisoner in her own home due to the inability to get out and about.  After  much discussion, including risks and benefits of surgery, as well as  clearance, both by cardiology as well as medical, by Dr. Joni Fears, it was  decided she would benefit from surgical intervention and was scheduled for  the above procedure.   COURSE IN THE HOSPITAL:  The patient tolerated the surgical procedure quite  well.  She is placed on Lovenox postoperatively, and will continue to do so  for 10 days after the date of surgery.  This is to prevent DVT.  The patient  worked with physical therapy, began ambulating.  She is up in a bedside  chair,  tolerating PT quite well.  Wound was clean and dry.  Neuro and  vascular was intact at the operative extremity.   Postoperative anemia, with a hemoglobin 7.2 and hematocrit 20.5, warranted a  transfusion, and she was transfused with packed cells.  This brought her  hemoglobin up to 13.0, hematocrit was 37.5.  She had much more energy, and  was able to fully participate in physical therapy.  Home arrangements were  made for physical therapy with [facility].  Once this is in place, it is felt  she could be discharged home.  Laboratory values in the hospital  hematologically showed a preoperative CBC with a mild anemia.  Hemoglobin  was 11.7, hematocrit was 34.5.  As mentioned above, her hemoglobin dropped  to 7.2, hematocrit was 20.5.  Final hemoglobin was 12.7, hematocrit was  36.6.  Urinalysis showed a preoperative urinary tract infection.  She  received perioperative antibiotics, and this was repeated, on November 19, 2005, and was essentially negative of.  Blood chemistries remained normal.  Chest x-ray showed no active cardiopulmonary disease, post CABG, rather  heavy costochondral calcification was noted.  No electrocardiogram seen on  this chart.   CONDITION ON DISCHARGE:  Improved, stable.   PLAN:  The patient is discharged to her home with home health and family  support.  She is to continue with her home medications and diet.  Vicodin is  given for discomfort, and Robaxin 500 mg for muscle spasm.  Return to see  Dr. Alvan [name] at 10 days after discharge.  She may use laxative of choice or  enema of choice at home.      Dooley L. Vanita Ingles.      Audrey Cassis Alvan [name], M.D.  Electronically Signed    DLU/MEDQ  D:  12/02/2005  T:  12/02/2005  Job:  [id number]   cc:   Shanna Cisco., MD  [place]  [place] [address]

## 2010-08-09 NOTE — Op Note (Signed)
[facility]  Patient:    SHUNITA, RUCH Visit Number: [id number] MRN: [id number]          Service Type: MED Location: 3W [id number] Attending Physician:  Jim Desanctis Dictated by:   Edsel Petrin. Dalbert Batman, M.D. Proc. Date: 08/26/01 Admit Date:  08/25/2001   CC:         Dellis Filbert C. Huey Bienenstock, M.D.  Jim Desanctis, M.D.   Operative Report  PREOPERATIVE DIAGNOSIS:  Carcinoma of the sigmoid colon.  POSTOPERATIVE DIAGNOSIS:  Carcinoma of the sigmoid colon.  OPERATION PERFORMED:  Sigmoid colon resection.  SURGEON:  Edsel Petrin. Dalbert Batman, M.D.  FIRST ASSISTANT:  Timothy E. Rosana Hoes, M.D.  OPERATIVE INDICATION:  This is a 78 year old white female, who recently developed some painless rectal bleeding.  A colonoscopy one week ago showed a large polyp in the sigmoid colon.  This was removed, for the most part.  The pathology showed invasive adenocarcinoma invading the submucosa, and the deep margin was questionably involved.  The colonoscopy was repeated yesterday, and this showed only an ulcer in the sigmoid colon at 25 cm, no residual polyp. This area was tattooed with [place] ink.  The patient completed her bowel prep last night and is brought to the operating room electively for segmental resection of the sigmoid colon.  OPERATIVE FINDINGS:  We were able to clearly identify in the mid to distal sigmoid colon, the area of Niger ink tattooing.  This was very focal.  There was no palpable mass in the colon.  There was no significant mesenteric adenopathy.  Small bowel looked normal.  The liver felt normal.  There was no liver mass.  There was no ascites.  The omentum felt normal.  Retroperitoneum felt normal.  The uterus looked normal.  OPERATIVE TECHNIQUE:  Following the induction of general endotracheal anesthesia, the patients abdomen was prepped and draped in a sterile fashion. A lower midline laparotomy incision was made in the infraumbilical area.  The fascia was  incised in the midline.  The abdominal cavity was entered and explored with findings as described above.  We mobilized the sigmoid colon by dividing its lateral peritoneal attachments to bring it up into the midline and into the wound.  Self-retaining retractors were placed.  We transected the sigmoid colon about 4-5 inches proximal and about 4-5 inches distal to the tattooed area.  This was done between [facility] clamps.  The mesentery was scored with the cautery, taking the mesenteric dissection widely back toward the retroperitoneum.  Mesenteric vessels were isolated, clamped, divided, and ligated with 2-0 silk ties.  The specimen was thus removed.  The specimen was sent to pathology, and Dr. Gari Crown examined it and found ulcer within the tattooed area consistent with the polypectomy site.  We opened the proximal and distal ends of the colon.  It was pink and healthy. On the posterior wall of the distal part of the anastomosis was a tiny ulcer. We cut this out and sent that to Dr. Gari Crown, and he examined this histologically and called [place] back and told us that this was a benign ulcer.  Anastomosis was set up and was created with a single layer of interrupted silk sutures.  Corner sutures of 2-0 silk were placed to set up the anastomosis.  A mattress suture of 2-0 silk was placed in the midline of the back wall of the anastomosis and tied.  The posterior wall of the anastomosis created with interrupted, inverting sutures of 2-0 silk.  Corner sutures were  carefully placed to turn in the corners.  The anterior wall of the anastomosis was created with interrupted, inverting sutures of 3-0 silk and 2-0 silk.  This provided a very secure circumferential anastomosis which looked fine to Dr. Rosana Hoes and I.  We then changed our gloves and instruments.  We copiously irrigated the abdomen and pelvis.  There was no bleeding at all.  The mesentery was closed with interrupted sutures of 3-0 silk.  We again  examined the operative field, found no bleeding, returned the colon and small bowel and omentum to their anatomic positions.  The midline fascia was closed with a running suture of #1 PDS.  After irrigation of the subcutaneous tissue, the skin was closed with skin staples.  Clean bandages were placed and the patient taken to the recovery room in stable condition.  Estimated blood loss was about [id number] cc Complications none.  Sponge, needle, and instrument counts were correct. Dictated by:   Edsel Petrin. Dalbert Batman, M.D. Attending Physician:  Jim Desanctis DD:  08/26/01 TD:  08/28/01 Job: [id number]

## 2010-08-09 NOTE — Procedures (Signed)
[facility]  Patient:    Audrey [name], Audrey [name] Visit Number: [id number] MRN: [id number]          Service Type: MED Location: 3W [id number] Attending Physician:  Jim Desanctis Dictated by:   Jim Desanctis, M.D. Proc. Date: 08/25/01 Admit Date:  08/25/2001   CC:         Dellis Filbert C. Huey Bienenstock, M.D.  Haywood Lasso, M.D.   Procedure Report  PROCEDURE:  Colonoscopy with injection.  INDICATION FOR PROCEDURE:  Rectal bleeding, colon cancer. [name] is a 77 year old lady who I had seen previously and on colonoscopy one week ago there was a polyp at 25 cm I removed which showed adenocarcinoma in the polyp down to 1 mm from the margin of removal and had discussed that with her and medical literature states that 2 mm is the minimum margin necessary to consider endoscopic treatment adequate. Colonoscopy was done at this time to see if we could remove further tissue or mark the area for surgical removal.  ANESTHESIA:  Demerol 60 mg, Versed 5 mg.  DESCRIPTION OF PROCEDURE:  With the patient mildly sedated in the left lateral decubitus position, the Olympus videoscopic colonoscope was inserted in the rectum and passed under direct vision to the cecum identified by the ileocecal valve and we explored the cecum. From this area, the colonoscope was slowly withdrawn taking circumferential views of the entire colonic mucosa stopping at approximately 25 cm from the anal verge at which point the site of the previously removed polyp was seen. The area was ulcerated as to be expected in a healing post polypectomy situation and healing fairly well. After looking and studying this area carefully, I felt that an injection and an attempt at removing this area with a snare would be probably disastrous and therefore did not attempt to do this but rather used ink 1.5 cc to outline the margin of this lesion for resection. The endoscope was then withdrawn to the rectum which appeared normal  on direct and retroflexed view. The endoscope was straightened and withdrawn. The patients vital signs and pulse oximeter remained stable. The patient tolerated the procedure well without apparent complications.  FINDINGS:  Residual ulcer healing at the site of previous polypectomy as to be expected unfortunately unable to raise this up with saline in order to remove endoscopically; therefore, have called surgery. I spoke with Dr. Margot Chimes concerning elective sigmoid colectomy. Dictated by:   Jim Desanctis, M.D. Attending Physician:  Jim Desanctis DD:  08/25/01 TD:  08/27/01 Job: [id number]

## 2010-08-09 NOTE — Op Note (Signed)
   NAME:  Audrey [name], Audrey [name]                           ACCOUNT NO.:  [id number]   MEDICAL RECORD NO.:  [id number]                   PATIENT TYPE:  AMB   LOCATION:  ENDO                                 FACILITY:  [facility]   PHYSICIAN:  Waverly Ferrari, M.D.                 DATE OF BIRTH:  [birth year]   DATE OF PROCEDURE:  DATE OF DISCHARGE:                                 OPERATIVE REPORT   PROCEDURE:  Colonoscopy with biopsy.   INDICATIONS FOR PROCEDURE:  Follow up with colon cancer.  See previous  clinical notes.   ANESTHESIA:  Demerol 30, Versed 4 mg.   DESCRIPTION OF PROCEDURE:  With the patient mildly sedated in the left  lateral decubitus position, the Olympus videoscopic colonoscope was inserted  in the rectum and passed under direct vision to the cecum identified by the  ileocecal valve and appendiceal orifice, the later of which was  photographed.  From this point, the colonoscope was slowly withdrawn, taking  circumflex views of the remaining colonic mucosa as we withdrew all the way  to the rectum, stopping only at the anastomotic site where suture material  was seen and surrounding tissue was adhesed up slightly.  There was an area  that was somewhat edematous and probably granulation tissue, but it was  photographed and multiple biopsies were taken.  The endoscope was then  withdrawn all the way to the rectum which appeared normal on direct and  retroflex view.  The endoscope was straightened and withdrawn.  The  patient's vital signs and pulses remained stable.  The patient tolerated the  procedure well without apparent complications.   FINDINGS:  What appears to be granulation tissue at the anastomotic site.   PLAN:  Await biopsy report.  The patient will call my office for results of  the biopsy and follow up with me as an outpatient.                                               Waverly Ferrari, M.D.    GMO/MEDQ  D:  10/18/2002  T:  10/18/2002  Job:  [id number]   cc:    Doroteo Bradford. Huey Bienenstock, M.D.  [address] [place] [address]  Fax: [place] Dalbert Batman, M.D.  [phone], [address] [place]  [place] [address]  Fax: [phone]

## 2010-08-09 NOTE — H&P (Signed)
[facility]  Patient:    Audrey [name], Audrey [name] Visit Number: [id number] MRN: [id number]          Service Type: MED Location: 3W [id number] Attending Physician:  Jim Desanctis Dictated by:   Edsel Petrin. Dalbert Batman, M.D. Admit Date:  08/25/2001   CC:         Dellis Filbert C. Huey Bienenstock, M.D.  Jim Desanctis, M.D.   History and Physical  CHIEF COMPLAINT:  Colon cancer.  HISTORY OF PRESENT ILLNESS:  This is a 77 year old white female who gives a six-month history of low-volume bright red rectal bleeding.  She denies pain, weight loss, or fevers.  She has been a little bit more constipated this past six months than usual.  She had a colonoscopy on May 28 by Dr. Jim Desanctis.  he removed a large polyp.  The pathology showed invasive adenocarcinoma invading into the submucosa with a question of an involved deep margin.  Colonoscopy was repeated today.  It showed an ulcer in the sigmoid colon at 25 cm.  No residual polyp.  This area was tattooed with [place] ink.  She has also had an upper endoscopy, which was reportedly negative.  She was advised to be admitted for completion of bowel prep and sigmoid colon resection, to which she consents.  PAST MEDICAL HISTORY:  She has had a D&C in the past.  She has had three pregnancies and three deliveries.  Otherwise no medical or surgical problems.  MEDICATIONS:  Zocor one-fourth tablet p.o. q.d., Maxzide one-half tablet p.o. q.d., aspirin daily was discontinued one week ago, vitamin E, and vitamin C.  DRUG ALLERGIES:  None known.  FAMILY HISTORY:  Mother died at age 21, had intra-abdominal cancer and gallstones.  Father died at age 21 of a myocardial infarction.  She has two sisters, living and well.  SOCIAL HISTORY:  The patient is married.  They have three children.  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  Denies the use of alcohol or tobacco.  REVIEW OF SYSTEMS:  All systems were  reviewed and are noncontributory except as described above.  PHYSICAL EXAMINATION:  VITAL SIGNS:  Vital signs this morning were blood pressure 140/70, temperature 96.7, pulse 76, respirations 16.  Height 5 feet 1 inch.  Weight 125 pounds.  GENERAL:  This is a very pleasant older female, who appears quite fit for her age.  HEENT:  Sclerae are clear.  Extraocular movements intact.  Oropharynx clear.  NECK:  Supple, nontender, no masses.  Questionable faint right carotid bruit.  CHEST:  Lungs clear to auscultation.  CARDIAC:  Regular rate and rhythm, no murmur.  BREASTS:  Not examined.  ABDOMEN:  Soft, nontender, no mass, no hernia.  EXTREMITIES:  No edema, good pulses.  NEUROLOGIC:  Within normal limits.  IMPRESSION:  Carcinoma of the sigmoid colon.  Hopefully this is a Dukes A cancer arising within a villous adenoma.  Cannot rule out deeper invasion. Cannot rule out lymph node metastasis.  PLAN: 1. The patient will complete her antibiotic bowel prep today, and we will    proceed with sigmoid colon resection tomorrow. 2. I have discussed the indications and details of surgery with her.  Risks    and complications have been outlined, including but not limited to    bleeding, infection, anastomotic leak with reoperation and colostomy,    injury to adjacent organs such as the kidney, ureter, bladder, small bowel,    or vascular  structures.  Wound problems such as infection or hernia have    been discussed.  She seems to understand these issues well.  At this time    all of her questions are answered.  She agrees with this plan. Dictated by:   Edsel Petrin. Dalbert Batman, M.D. Attending Physician:  Jim Desanctis DD:  08/25/01 TD:  08/25/01 Job: [id number]

## 2010-08-09 NOTE — Discharge Summary (Signed)
[facility]  Patient:    Audrey [name], Audrey [name] Visit Number: [id number] MRN: [id number]          Service Type: MED Location: 3W [id number] Attending Physician:  Barbera Setters Dictated by:   Edsel Petrin. Dalbert Batman, M.D. Admit Date:  08/25/2001 Discharge Date: 09/01/2001   CC:         Dellis Filbert C. Huey Bienenstock, M.D.  Jim Desanctis, M.D.   Discharge Summary  FINAL DIAGNOSIS:  Carcinoma of the sigmoid colon, stage T1, N0.  OPERATIONS PERFORMED: 1. Colonoscopy. 2. Sigmoid colectomy.  HISTORY OF PRESENT ILLNESS:  The patient is a 77 year old white female who has had a 64-month history of painless rectal bleeding but no other symptoms. Colonoscopy was performed on Aug 18, 2001 by Dr. Jim Desanctis and he removed a large polyp in the sigmoid colon.  The pathology showed invasive adenocarcinoma invading into the submucosa and a question of an involved margin.  Colonoscopy was repeated on August 25, 2001.  There was no residual polyp.  Dr. Lajoyce Corners tattooed the polypectomy site with [place] ink and the patient was admitted for completion of bowel prep and surgery.  PHYSICAL EXAMINATION:  GENERAL:  Very pleasant older female who appears quite fit for her age in no distress.  NECK:   No adenopathy.  LUNGS:  Clear.  HEART:  Regular rate and rhythm.  ABDOMEN:  Soft, nontender.  No mass.  EXTREMITIES:  No edema.  Good pulses.  NEUROLOGIC:  Grossly within normal limits.  HOSPITAL COURSE:  On the day of admission the patient awoke from her sedation from her colonoscopy without any problems or pain and did not appear to have any complications of her colonoscopy.  She completed an antibiotic and mechanical bowel prep that evening.  She was taken to the operating room on August 26, 2001 and underwent a sigmoid colectomy.  I could see the tattooed area of the sigmoid colon in the mid to distal sigmoid colon and a segmental resection was performed.  There was no other signs of cancer  anywhere.  Postoperatively, the patient did well.  She had an ileus for a couple of days but that ultimately resolved and we were able to advance her diet.  She began having bowel movements on about the fourth postop day and did well after that tolerating a regular diet.  Final pathology report showed no residual cancer at the polypectomy site and nine out of nine lymph nodes were negative.  I discussed this with the patient, told her she had a T1, N0 cancer and that there was no indication for chemotherapy.  Medical oncology consultation was offered but she declined that.  She was discharged on September 01, 2001.  She was feeling well, tolerating a regular diet, had had several bowel movements and wanted to go home.  Her abdomen was soft and benign and the wound looked fine.  She was given a prescription for Vicodin for pain.  She was asked to return to see me in the office in 5-7 days for staple removal. Dictated by:   Edsel Petrin. Dalbert Batman, M.D. Attending Physician:  Barbera Setters DD:  09/08/01 TD:  09/09/01 Job: [id number]

## 2010-08-09 NOTE — Cardiovascular Report (Signed)
NAME:  Audrey [name], Audrey [name]                           ACCOUNT NO.:  [id number]   MEDICAL RECORD NO.:  [id number]                   PATIENT TYPE:  OIB   LOCATION:  [id number]                                 FACILITY:  [place]   PHYSICIAN:  Junious Silk, M.D. [facility]         DATE OF BIRTH:  April 21, 1926   DATE OF PROCEDURE:  02/21/2003  DATE OF DISCHARGE:                              CARDIAC CATHETERIZATION   PROCEDURES PERFORMED:  1. Left heart catheterization with,  2. Coronary angiography and,  3. Left ventriculography.   CARDIOLOGIST:  Junious Silk, M.D.   INDICATIONS:  Ms. Hegger is a 77 year old woman with progressive exertional  angina.  A stress Cardiolite study is notable for the patient only being  able to exercise 3 minutes 45 seconds with reproduction of her symptoms;  however, there were no diagnostic EKG changes and Cardiolite scan was  interpreted as showing mild breast attenuation, but no ischemia.  Nevertheless, based on the reproducible nature of the patient's symptoms we  opted to proceed with cardiac catheterization.   PROCEDURAL NOTE:  A 6 French sheath was placed in the right femoral artery.  Coronary angiography was performed with 6 [place] JL-4 and JR-4 catheters.  Left ventriculography was performed with an angle pigtail catheter.   CONTRAST USED:  Contrast was Omnipaque.   COMPLICATIONS:  There were no complications.   RESULTS:   HEMODYNAMIC DATA:  Left ventricular pressure 158/6.  Aortic pressure 160/75.  There is no aortic valve gradient.   VENTRICULOGRAPHIC DATA:  Left Ventriculogram:  Wall motion is normal.  Ejection fraction estimated at greater than or equal to 65%.  There is trace  mitral regurgitation.   ANGIOGRAPHIC DATA:  Coronary Arteriography (Codominant)  Left Main:  The left main has an ostial 80% stenosis.   Left Anterior Descending Artery:  The left anterior descending artery has  moderate calcium in the proximal-to-midvessel.  There is a  diffuse 90%  stenosis within the diseased segment of the proximal vessel.  The mid LAD  has a diffuse 30% stenosis.  The LAD gives rise to two small diagonal  branches.   Left Circumflex:  The left circumflex has a tubular 80% stenosis in the  midvessel.  The circumflex gives rise to a first and second obtuse marginal  branches.  The distal circumflex also gives rise to a first posterolateral  branch and a second posterolateral branch.   Right Coronary Artery:  The right coronary artery is a small codominant  vessel.  It gives rise to a small posterior descending artery and a large  acute marginal.  It supplies the distal portion of the inferior septum.   IMPRESSION:  1. Normal left ventricular systolic function.  2. Two-vessel coronary artery disease characterized by ostial left main     disease and significant disease in the left anterior descending artery     and left circumflex coronary arteries.   PLAN:  The  patient will be referred for coronary artery bypass surgery.                                               Junious Silk, M.D. [facility]    MWP/MEDQ  D:  02/21/2003  T:  02/21/2003  Job:  [id number]   cc:   Shanna Cisco., M.D.  [address] [place]  [place] [address]  Fax: [phone]   Cardiac Catheterization Laboratory

## 2010-08-09 NOTE — H&P (Signed)
[name], [name] NO.:  [id number]   MEDICAL RECORD NO.:  [id number]          PATIENT TYPE:   LOCATION:                                 FACILITY:   PHYSICIAN:  Audrey [name], M.D.  DATE OF BIRTH:  Mar 07, 1927   DATE OF ADMISSION:  11/19/2005  DATE OF DISCHARGE:                                HISTORY & PHYSICAL   DATE OF ADMISSION TO [facility]:  November 19, 2005   CHIEF COMPLAINT:  Pain in my hips, more so on the left than right.   PRESENT ILLNESS:  This 77 year old white female seen by [place] for continued and  progressive problems concerning pain in her hips.  She has marked pain into  the left hip, has difficulty getting about, cannot climb long stairs, and is  essentially a prisoner in her own home.  She has more and more difficulty  getting about, has now a limp with limited range of motion of both hips.  X-  rays have shown degenerative joint disease on both hips, more so on the left  than the right.  She had been using anti-inflammatories and has had hip  injections which really has offered her no comfort.  After much discussion  including the risks and benefits of surgery as well as clearance, both  cardiology and medical by Dr. Joni Fears, it was felt she would benefit from  surgical intervention and being admitted for total hip replacement  arthroplasty of the left hip.   She is a widow and her daughter is able stay with her during the night but  she has no one that can care for her during the daytime.  We therefore will  either arrange for sitters for her through [facility]'s recommendation along  with home health from [place], or she will have to go to skilled nursing.  We plan for 3- or 4-day hospital stay.   PAST MEDICAL HISTORY:  This lady's been in relatively good health.  She does  have hypercholesterolemia, hypertension, past colectomy for colon cancer,  and CABG on February 22, 2003.  She also has some mild arthritis in the low  back.   She has no medical allergies.   CURRENT MEDICATIONS:  1. Folic acid 1 mg a day.  2. Vitamin C one a day.  3. Metoprolol succinate 50 mg daily.  4. Micardis 20 mg daily.  5. Pravastatin 10 mg daily.  6. Multivitamin daily.  7. Aspirin 81 mg daily.  8. Occasional ibuprofen.   She will stop the aspirin and ibuprofen prior to surgery.   FAMILY HISTORY:  Positive for heart disease and diabetes.   SOCIAL HISTORY:  The patient is widowed.  She has a clerical history.  No  intake of alcohol or tobacco products.  Has three children.  She lives in a  second-story house, does not use those stairs but has three steps up to get  into the house.   REVIEW OF SYSTEMS:  CNS:  No seizure disorder, paralysis, numbness, or  double vision.  RESPIRATORY:  No productive cough, no hemoptysis, no  shortness of breath.  CARDIOVASCULAR:  No chest pain, no angina or  orthopnea.  GASTROINTESTINAL:  No nausea, vomiting, melena, bloody stools.  GENITOURINARY:  No discharge, dysuria, hematuria.  MUSCULOSKELETAL:  Primarily in present illness.   PHYSICAL EXAMINATION:  GENERAL:  Alert and cooperative, fully oriented 37-  year-old white female who does decidedly walk with a limp and has difficulty  coming to a standing position.  She is not using a walking aid today.  VITAL SIGNS:  Blood pressure 120/60, pulse 68, respirations are 12.  HEENT:  Normocephalic.  EOM intact.  Oropharynx is clear.  CHEST:  Clear to auscultation, no rhonchi, no rales.  HEART:  With regular rate and rhythm.  No murmurs are heard.  ABDOMEN:  Soft, nontender.  Liver and spleen not felt.  GENITALIA, RECTAL, PELVIC, BREAST:  Not done, not pertinent to present  illness.  EXTREMITIES:  The patient has painful range of motion of both hips.   ADMITTING DIAGNOSES:  1. Severe osteoarthritis of both hips, more so on the left than right.  2. Coronary artery disease.  3. Status post coronary artery bypass graft.  4. Hypercholesterolemia.  5.  Hypertension.   PLAN:  The patient will be admitted for left total hip replacement  arthroplasty.  As mentioned above, she will need care with home health,  preferably a sitter, as she lives alone.  She may need skilled nursing with  rehabilitation.  We will just certainly see how she does in the hospital.      Sarina Ill L. Vanita Ingles.      Audrey Cassis Alvan [name], M.D.  Electronically Signed    DLU/MEDQ  D:  11/05/2005  T:  11/05/2005  Job:  [id number]   cc:   Shanna Cisco., MD  [place]  [place] [address]

## 2010-08-09 NOTE — Consult Note (Signed)
NAME:  Audrey [name], Audrey [name]                           ACCOUNT NO.:  [id number]   MEDICAL RECORD NO.:  [id number]                   PATIENT TYPE:  OIB   LOCATION:  2867                                 FACILITY:  [facility]   PHYSICIAN:  Lilia Argue. Servando Snare, M.D.            DATE OF BIRTH:  1926-06-04   DATE OF CONSULTATION:  02/21/2003  DATE OF DISCHARGE:                                   CONSULTATION   FOLLOWUP CARDIOLOGIST:  Junious Silk, M.D. [facility]   PRIMARY CARE PHYSICIAN:  Frankey Shown, M.D.   REASON FOR CONSULTATION:  Critical left main disease.   HISTORY OF PRESENT ILLNESS:  The patient is a 77 year old female who sought  medical attention from Dr. Joni Fears two weeks because of onset of left chest  wall tenderness with vesicles consistent with new onset of shingles. These  were treated, but while seeing Dr. Joni Fears she also mentioned that she had  had exertional shortness of breath since August 2004 along with substernal  chest pain usually when climbing stairs. It is relieved with rest. No  diaphoresis or nausea associated. Since August she noted steady progression  of the symptoms, severity, and frequency. She continued to have pain in  moderate degree with vacuum, sweeping, raking leaves, and over the past  month she has limited her activities.   She underwent an exercise Cardiolite, exercising for 3 minutes 45 seconds  achieving a heart rate of 142. She did experience chest tightness, however  there was no electrocardiographic changes and Cardiolite images were felt to  be normal.   She has had no history of myocardial infarction in the past.   She denies rest pain, orthopnea, PND, or pedal edema. She denies  palpitations, light-headedness, or syncope. Her  cardiac risk factors  include hypertension, hyperlipidemia, distant history of smoking having quit  more than 25 years ago. She does have a family history of coronary artery  disease. Her father died at age 43 of MI.  Her mother died at age 27 of  cancer. She has one sister with congestive heart failure.  She does note hip  claudication. She denies renal insufficiency.   PAST MEDICAL HISTORY:  1. Hypertension.  2. Hyperlipidemia.  3. History of colon cancer, status post resection in June 2003 by Dr.     Fanny Skates.   SOCIAL HISTORY:  The patient is married and lives with her husband. She has  three children. 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   MEDICATIONS:  1. Aspirin 161 mg a day.  2. Zocor 20 mg a day.  3. Vitamin E and C.  4. Multivitamin.  5. Micardis 40 mg a day.   DRUG ALLERGIES:  None known.   REVIEW OF SYSTEMS:  GENERAL: Denies any constitutional symptoms. Denies  headache, TIA, or syncope. She does wear glasses. GI: Positive history of  constipation. Denies any blood in her stool  since her colon resection.  Denies any blood in her urine. She denies any recent infection. She does  note bilateral hip claudication, but no calf claudication. Denies diabetes.  Denies any psychiatric history.  Other review of systems negative.   PHYSICAL EXAMINATION:  VITAL SIGNS: Blood pressure 166/64, pulse 84 and  regular, respiratory rate 17.  She is 5 feet 1 inch tall, weighs 130 pounds.  GENERAL: The patient is awake and alert, neurologically intact. She is able  to relate her history without difficulty. She is in no acute distress.  HEENT:  Pupils equal, round, and reactive to light.  NECK: Without carotid bruits or jugular venous distention.  CHEST: Lungs are clear bilaterally.  CARDIAC: Regular rate and rhythm without murmur or gallop.  ABDOMEN: Midline abdominal incision from previous surgery. She has no  palpable masses. Liver is not palpably enlarged.  EXTREMITIES: Lower extremities with +1 DP and PT pulses.  There is a  dressing over the right groin from the cath site without evidence of  bleeding or hematoma.  LYMPH NODES: There is no palpable cervical,  supraclavicular, or inguinal  lymph nodes.   LABORATORY FINDINGS:  White count 4.8, hemoglobin 12.8, hematocrit 36.9,  platelet count 277,000.  PT is 11.7, INR 1.1, PTT 29.8. Sodium 141,  potassium 4.4, creatinine 0.9, glucose 79.  Cardiac catheterization films  are reviewed with Dr. Vicenta Aly. She has an 80% left main ostia, 70%  proximal LAD, 90% mid, and 30% distal. There is an 80% proximal circumflex  lesion. The right coronary artery is a relatively small vessel, but without  any significant disease.   After reviewing the history with Dr. Vicenta Aly, I agree with the  recommendation to proceed with coronary artery bypass grafting as her best  option for preservation of life and relief of symptoms.  The risks of the  procedure include the risk of death, infection, myocardial infarction,  bleeding, and transfusion were all discussed with the patient in detail. She  is willing to proceed. Will plan to proceed on tomorrow, February 22, 2003.  The patient is agreeable and had her questions answered.                                               Lilia Argue Servando Snare, M.D.    Mcneil Sober  D:  02/21/2003  T:  02/21/2003  Job:  [id number]

## 2010-10-08 ENCOUNTER — Encounter: Payer: Self-pay | Admitting: Internal Medicine

## 2010-11-11 ENCOUNTER — Ambulatory Visit (INDEPENDENT_AMBULATORY_CARE_PROVIDER_SITE_OTHER): Payer: Medicare Other | Admitting: Internal Medicine

## 2010-11-11 ENCOUNTER — Encounter: Payer: Self-pay | Admitting: Internal Medicine

## 2010-11-11 VITALS — BP 118/50 | HR 61 | Ht 61.0 in | Wt 139.0 lb

## 2010-11-11 DIAGNOSIS — I2510 Atherosclerotic heart disease of native coronary artery without angina pectoris: Secondary | ICD-10-CM | POA: Insufficient documentation

## 2010-11-11 DIAGNOSIS — I6529 Occlusion and stenosis of unspecified carotid artery: Secondary | ICD-10-CM

## 2010-11-11 DIAGNOSIS — I714 Abdominal aortic aneurysm, without rupture: Secondary | ICD-10-CM

## 2010-11-11 DIAGNOSIS — E785 Hyperlipidemia, unspecified: Secondary | ICD-10-CM

## 2010-11-11 MED ORDER — CLOPIDOGREL BISULFATE 75 MG PO TABS: 75.0000 mg | ORAL_TABLET | Freq: Every day | ORAL | Status: DC

## 2010-11-11 MED ORDER — TELMISARTAN 20 MG PO TABS: 20.0000 mg | ORAL_TABLET | Freq: Every day | ORAL | Status: DC

## 2010-11-11 MED ORDER — METOPROLOL SUCCINATE ER 50 MG PO TB24: 50.0000 mg | ORAL_TABLET | Freq: Every day | ORAL | Status: DC

## 2010-11-11 NOTE — Assessment & Plan Note (Addendum)
Asymptomatic. Due for follow up u/s, will schedule.

## 2010-11-11 NOTE — Assessment & Plan Note (Addendum)
On Crestor, needs FLP and CMET.  She has eaten today but will bring her back for FLP.

## 2010-11-11 NOTE — Assessment & Plan Note (Signed)
Stable without evidence of ischemia.  Will continue current medical regimen.

## 2010-11-11 NOTE — Progress Notes (Signed)
HPI:  Audrey [name] is a delightful 77 year old woman with history of coronary artery disease, status post two-vessel bypass grafting in December 2004.  She also has a history of hypertension, hyperlipidemia, bilateral carotid stenoses, as well as elevated liver enzymes on Lipitor.    Carotid u/s 2/12 showed stable disease 60-79% bilaterally. Due for f/u scan today. Ab US showed very small AAA 2.1x2.1.   She had c/o diplopia in 2/12 and repeat MRI/MRA were scheduled and results were without acute changes.  She was to follow up with neurology.  Neurology reviewed studies and felt that no further w/u was needed.  Instructed to take Ocuvite vitamins.  She continues to have double vision while watching tv at night but no problems during the day or during the office visit today.   Returns for routine follow up today.  She is doing well.  She denies CP, orthopnea, dyspnea or PND.  She has pain in her left leg when she walks, ibuprofen helps relieve pain.     ROS: All systems negative except as listed in HPI, PMH and Problem List.  Past Medical History  Diagnosis Date  . CAD (coronary artery disease)     including LM disease; s/p CABG  . HTN (hypertension)   . Hyperlipidemia   . Bilateral carotid artery stenosis   . OA (osteoarthritis)     severe; s/p bilateral hip replacement  . History of colon cancer     s/p resection in 2004  . AAA (abdominal aortic aneurysm)     small  . History of colonoscopy     Current Outpatient Prescriptions  Medication Sig Dispense Refill  . aspirin (ADULT ASPIRIN EC LOW STRENGTH) 81 MG EC tablet Take 81 mg by mouth daily.        . clopidogrel (PLAVIX) 75 MG tablet Take 75 mg by mouth daily.        Marland Kitchen ezetimibe (ZETIA) 10 MG tablet Take 10 mg by mouth daily.        . metoprolol (TOPROL-XL) 50 MG 24 hr tablet Take 50 mg by mouth daily.        . Multiple Vitamin (MULTIVITAMIN) capsule Take 1 capsule by mouth daily.        . rosuvastatin (CRESTOR) 40 MG tablet Take 40  mg by mouth daily.        Marland Kitchen telmisartan (MICARDIS) 20 MG tablet Take 20 mg by mouth daily.        . vitamin C (ASCORBIC ACID) 250 MG tablet Take 250 mg by mouth daily.           PHYSICAL EXAM: Filed Vitals:   11/11/10 1552  BP: 118/50  Pulse: 61    General:  Well appearing. no resp difficulty, pleasant affect  HEENT: normal Neck: supple. no JVD. Carotids 2+ bilat; no bruits. No lymphadenopathy or thryomegaly appreciated. Cor: PMI nondisplaced. Regular rate & rhythm. No rubs, gallops, murmur. Lungs: clear with mildly decreased breath sounds Abdomen: soft, nontender, nondistended. No bruit. Good bowel sounds. Extremities: no cyanosis, clubbing, rash, edema Neuro: alert & orientedx3, cranial nerves grossly intact. moves all 4 extremities w/o difficulty.   ECG: NSR at 61 bpm.  Incomplete RBBB, QRS 114.   ASSESSMENT & PLAN:

## 2010-11-11 NOTE — Patient Instructions (Signed)
Your physician recommends that you return for a FASTING lipid, liver and bmet (272.4, 414.01) with test  Your physician has requested that you have a carotid duplex. This test is an ultrasound of the carotid arteries in your neck. It looks at blood flow through these arteries that supply the brain with blood. Allow one hour for this exam. There are no restrictions or special instructions.  Your physician has requested that you have an abdominal aorta duplex. During this test, an ultrasound is used to evaluate the aorta. Allow 30 minutes for this exam. Do not eat after midnight the day before and avoid carbonated beverages  Your physician wants you to follow-up in: 9 months. You will receive a reminder letter in the mail two months in advance. If you don't receive a letter, please call our office to schedule the follow-up appointment.

## 2010-11-11 NOTE — Assessment & Plan Note (Signed)
Needs f/u u/s today, will schedule.

## 2010-11-18 ENCOUNTER — Encounter (INDEPENDENT_AMBULATORY_CARE_PROVIDER_SITE_OTHER): Payer: Medicare Other | Admitting: Cardiology

## 2010-11-18 ENCOUNTER — Other Ambulatory Visit (INDEPENDENT_AMBULATORY_CARE_PROVIDER_SITE_OTHER): Payer: Medicare Other | Admitting: *Deleted

## 2010-11-18 DIAGNOSIS — I6529 Occlusion and stenosis of unspecified carotid artery: Secondary | ICD-10-CM

## 2010-11-18 DIAGNOSIS — E785 Hyperlipidemia, unspecified: Secondary | ICD-10-CM

## 2010-11-18 DIAGNOSIS — I714 Abdominal aortic aneurysm, without rupture: Secondary | ICD-10-CM

## 2010-11-18 DIAGNOSIS — I2510 Atherosclerotic heart disease of native coronary artery without angina pectoris: Secondary | ICD-10-CM

## 2010-11-18 DIAGNOSIS — I700 Atherosclerosis of aorta: Secondary | ICD-10-CM

## 2010-11-18 LAB — LIPID PANEL
HDL: 50.4 mg/dL (ref >39.00)
LDL Cholesterol: 28 mg/dL (ref 0–99)
Total CHOL/HDL Ratio: 2
Triglycerides: 101 mg/dL (ref 0.0–149.0)

## 2010-11-18 LAB — HEPATIC FUNCTION PANEL
Bilirubin, Direct: 0.2 mg/dL (ref 0.0–0.3)
Total Bilirubin: 0.8 mg/dL (ref 0.3–1.2)

## 2010-11-18 LAB — BASIC METABOLIC PANEL
Calcium: 9 mg/dL (ref 8.4–10.5)
Chloride: 109 mEq/L (ref 96–112)
Creatinine, Ser: 1.3 mg/dL — ABNORMAL HIGH (ref 0.4–1.2)

## 2010-11-22 ENCOUNTER — Encounter (HOSPITAL_COMMUNITY): Payer: Self-pay | Admitting: *Deleted

## 2010-11-27 ENCOUNTER — Encounter (HOSPITAL_COMMUNITY): Payer: Self-pay | Admitting: *Deleted

## 2010-12-04 NOTE — Progress Notes (Signed)
N/A.  LMTC. 

## 2010-12-05 ENCOUNTER — Telehealth: Payer: Self-pay | Admitting: Internal Medicine

## 2010-12-05 NOTE — Telephone Encounter (Signed)
Pt returning call from Louann yesterday. Please return pt call.

## 2010-12-05 NOTE — Telephone Encounter (Signed)
Pt was called with her test results.

## 2010-12-05 NOTE — Progress Notes (Signed)
Pt notified of results

## 2010-12-05 NOTE — Progress Notes (Signed)
Pt was notified of the results 

## 2011-01-01 LAB — PREPARE RBC (CROSSMATCH)

## 2011-01-01 LAB — CBC
Hemoglobin: 7.7 — CL
Hemoglobin: 8.5 — ABNORMAL LOW
MCHC: 35.4
RDW: 14.8 — ABNORMAL HIGH
RDW: 14.9 — ABNORMAL HIGH

## 2011-01-01 LAB — HEMOGLOBIN AND HEMATOCRIT, BLOOD
HCT: 29.3 — ABNORMAL LOW
Hemoglobin: 10.2 — ABNORMAL LOW

## 2011-01-01 LAB — BASIC METABOLIC PANEL
CO2: 25
Calcium: 7.9 — ABNORMAL LOW
Calcium: 8 — ABNORMAL LOW
Creatinine, Ser: 1.08
Creatinine, Ser: 1.09
GFR calc Af Amer: 59 — ABNORMAL LOW
GFR calc non Af Amer: 49 — ABNORMAL LOW
Glucose, Bld: 129 — ABNORMAL HIGH
Glucose, Bld: 131 — ABNORMAL HIGH
Sodium: 132 — ABNORMAL LOW

## 2011-01-01 LAB — TYPE AND SCREEN

## 2011-01-02 LAB — BASIC METABOLIC PANEL
BUN: 23
Calcium: 9.9
Creatinine, Ser: 1.07
GFR calc non Af Amer: 50 — ABNORMAL LOW
Potassium: 4.9

## 2011-01-02 LAB — URINALYSIS, ROUTINE W REFLEX MICROSCOPIC
Bilirubin Urine: NEGATIVE
Hgb urine dipstick: NEGATIVE
Ketones, ur: NEGATIVE
Nitrite: NEGATIVE
pH: 6

## 2011-01-02 LAB — HEMOGLOBIN AND HEMATOCRIT, BLOOD
HCT: 36.4
Hemoglobin: 12.8

## 2011-03-25 HISTORY — PX: REFRACTIVE SURGERY: SHX103

## 2011-04-01 ENCOUNTER — Ambulatory Visit (INDEPENDENT_AMBULATORY_CARE_PROVIDER_SITE_OTHER): Payer: Medicare Other | Admitting: Internal Medicine

## 2011-04-01 ENCOUNTER — Encounter: Payer: Self-pay | Admitting: Internal Medicine

## 2011-04-01 VITALS — BP 120/80 | HR 60 | Ht 59.5 in | Wt 132.0 lb

## 2011-04-01 DIAGNOSIS — I10 Essential (primary) hypertension: Secondary | ICD-10-CM

## 2011-04-01 DIAGNOSIS — I714 Abdominal aortic aneurysm, without rupture: Secondary | ICD-10-CM

## 2011-04-01 DIAGNOSIS — E785 Hyperlipidemia, unspecified: Secondary | ICD-10-CM

## 2011-04-01 DIAGNOSIS — I6523 Occlusion and stenosis of bilateral carotid arteries: Secondary | ICD-10-CM

## 2011-04-01 DIAGNOSIS — Z23 Encounter for immunization: Secondary | ICD-10-CM

## 2011-04-01 DIAGNOSIS — M79609 Pain in unspecified limb: Secondary | ICD-10-CM | POA: Diagnosis not present

## 2011-04-01 DIAGNOSIS — Z85038 Personal history of other malignant neoplasm of large intestine: Secondary | ICD-10-CM

## 2011-04-01 DIAGNOSIS — I2510 Atherosclerotic heart disease of native coronary artery without angina pectoris: Secondary | ICD-10-CM

## 2011-04-01 DIAGNOSIS — M549 Dorsalgia, unspecified: Secondary | ICD-10-CM | POA: Insufficient documentation

## 2011-04-01 DIAGNOSIS — M899 Disorder of bone, unspecified: Secondary | ICD-10-CM

## 2011-04-01 DIAGNOSIS — M79605 Pain in left leg: Secondary | ICD-10-CM | POA: Insufficient documentation

## 2011-04-01 DIAGNOSIS — M1990 Unspecified osteoarthritis, unspecified site: Secondary | ICD-10-CM | POA: Diagnosis not present

## 2011-04-01 DIAGNOSIS — M8580 Other specified disorders of bone density and structure, unspecified site: Secondary | ICD-10-CM

## 2011-04-01 DIAGNOSIS — I6529 Occlusion and stenosis of unspecified carotid artery: Secondary | ICD-10-CM

## 2011-04-01 NOTE — Progress Notes (Signed)
Subjective:    Patient ID: Audrey [name], female    DOB: 1926/05/17, 78 y.o.   MRN: [id number]  HPI Pt comes in today as new patient ot me  Last seen Dr Joni Fears  5 2009   So new patient data obtained .Is here with daughter    CADSees cardiology for cad management  . Denies  Ongoing cv sx at this time.   AAA: Following   HT :  Controlled  Ongoing problem with  Mid back pain  When stands a long time.   Not up at night.  Aching since hip surgery.  Pain down left leg ocass to foot; hard to walk now.  Saw Dr Alvan Dame recently  For leg pain.  Got shot of cortisone.    Hx of colon cancer: and sig resection no sig gi sx at this time  Does surveillance   HCM hasn't been getting flu shots caution about risk benefit.   Review of Systems Neg cp sob bleeding NVD weight changes cognitive concerns   GI gu   Hearing aid denturesrest negative or as per hpi  .  Past Medical History  Diagnosis Date  . CAD (coronary artery disease)     including LM disease; s/p CABG  . HTN (hypertension)   . Hyperlipidemia   . Bilateral carotid artery stenosis   . OA (osteoarthritis)     severe; s/p bilateral hip replacement  . History of colon cancer     s/p resection in 2004  . AAA (abdominal aortic aneurysm)     small  . History of colonoscopy   . History of chickenpox     History   Social History  . Marital Status: Widowed    Spouse Name: N/A    Number of Children: 3  . Years of Education: N/A   Occupational History  . Not on file.   Social History Main Topics  . Smoking status: Never Smoker   . Smokeless tobacco: Not on file  . Alcohol Use: No  . Drug Use: No  . Sexually Active: Not on file   Other Topics Concern  . 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  Has smoke detector and wears seat belts. . No excess sun exposure. . No depression    Past Surgical History  Procedure Date  . Partial colectomy 2004    sigmoid resection  . Left hip replacement 2007  . Right hip replacement 2008  . Coronary artery bypass graft 2004    Family History  Problem Relation Age of Onset  . Cancer Mother     intra-abdominal/gallbladder  . Cholelithiasis Mother   . Heart attack Father     22  . Heart disease Father   . Colon cancer Neg Hx   . Diabetes Sister     59de  . Other Brother     GSW 45     No Known Allergies  Current Outpatient Prescriptions on File Prior to Visit  Medication Sig Dispense Refill  . aspirin (ADULT ASPIRIN EC LOW STRENGTH) 81 MG EC tablet Take 81 mg by mouth daily.        . clopidogrel (PLAVIX) 75 MG tablet Take 1  tablet (75 mg total) by mouth daily.  90 tablet  3  . ezetimibe (ZETIA) 10 MG tablet Take 10 mg by mouth daily.        . metoprolol (TOPROL-XL) 50 MG 24 hr tablet Take 1 tablet (50 mg total) by mouth daily.  90 tablet  3  . rosuvastatin (CRESTOR) 40 MG tablet Take 40 mg by mouth daily.        Marland Kitchen telmisartan (MICARDIS) 20 MG tablet Take 1 tablet (20 mg total) by mouth daily.  90 tablet  3  . vitamin C (ASCORBIC ACID) 250 MG tablet Take 500 mg by mouth daily.         BP 120/80  Pulse 60  Ht 4' 11.5" (1.511 m)  Wt 132 lb (59.875 kg)  BMI 26.21 kg/m2       Objective:   Physical Exam Physical Exam: Vital signs reviewed [id number] is a well-developed well-nourished alert cooperative  Well appearing white female who appears yonunger than her stated age in no acute distress.  HEENT: normocephalic atraumatic , Eyes: PERRL EOM's full, conjunctiva clear, Nares: paten,t no deformity discharge or tenderness., Ears: no deformity EAC's clear TMs with normal landmarks. Mouth: clear OP, no lesions, edema.  Moist mucous membranes.  NECK: supple  without masses, thyromegaly or bruits. CHEST/PULM:  Clear to auscultation and percussion breath sounds equal no wheeze , rales or rhonchi. No chest wall deformities or tenderness. CV: PMI is nondisplaced, S1 S2 no gallops, murmurs, rubs. Peripheral pulses are full without delay.No JVD .  ABDOMEN: Bowel sounds normal nontender  No guard or rebound, no hepato splenomegal no CVA tenderness.   Extremtities:  No clubbing cyanosis or edema, no acute joint swelling or redness no focal atrophy OAchange mild  NEURO:  Oriented x3, cranial nerves 3-12 appear to be intact, no obvious focal weakness,gait within normal limits no abnormal reflexes or asymmetry obvious SKIN: No acute rashes normal turgor, color, no bruising or petechiae. PSYCH: Oriented, good eye contact, no obvious depression anxiety, cognition and judgment appear normal. Oriented x 3 and no noted deficits in memory, attention, and speech. LN: no cervical  adenopathy           Assessment & Plan:  Leg pain  Poss MS oriented .   radiating evaluated by  Ortho  . Problematic but not progressive at this point   Preventive Health Care She has not had flu shots in the past but discussed with her the risk of heart attacks with acute influenza so she will receive the flu vaccine today I also recommend the Pneumovax she can call for that when convenient. She is probably due for Tdap . But Medicare will not pay for this on except under injury protocols.  Also  Should have dexa scan by risk hx  Age etc.  Disc today  Coronary artery disease status post coronary bypass 2004 staple Hyperlipidemia on meds  Followed labs per cards. Carotid disease   60 - 79 bilaterally followed every 6 months   AAA 2.1 cm  Stable  Hypertension  Controlled OA /DJD  Hx  Of colon cancer    Total visit 45 mins > 50% spent counseling and coordinating care

## 2011-04-01 NOTE — Patient Instructions (Addendum)
Will plan bone density .  Take [phone] vit d equivalents a day.  Wellness check in a year.  Call ahead and come back for pneumonia shot ( pneumovax)

## 2011-04-03 ENCOUNTER — Encounter: Payer: Self-pay | Admitting: Internal Medicine

## 2011-04-13 ENCOUNTER — Encounter: Payer: Self-pay | Admitting: Internal Medicine

## 2011-04-13 DIAGNOSIS — Z85038 Personal history of other malignant neoplasm of large intestine: Secondary | ICD-10-CM | POA: Insufficient documentation

## 2011-04-13 DIAGNOSIS — I2510 Atherosclerotic heart disease of native coronary artery without angina pectoris: Secondary | ICD-10-CM | POA: Insufficient documentation

## 2011-04-13 DIAGNOSIS — I10 Essential (primary) hypertension: Secondary | ICD-10-CM | POA: Insufficient documentation

## 2011-04-13 DIAGNOSIS — M1990 Unspecified osteoarthritis, unspecified site: Secondary | ICD-10-CM | POA: Insufficient documentation

## 2011-04-13 DIAGNOSIS — I714 Abdominal aortic aneurysm, without rupture: Secondary | ICD-10-CM | POA: Insufficient documentation

## 2011-04-13 DIAGNOSIS — I6523 Occlusion and stenosis of bilateral carotid arteries: Secondary | ICD-10-CM | POA: Insufficient documentation

## 2011-04-13 DIAGNOSIS — E785 Hyperlipidemia, unspecified: Secondary | ICD-10-CM | POA: Insufficient documentation

## 2011-04-13 NOTE — Assessment & Plan Note (Signed)
60 - 79 % bilaterally following q 6 mon

## 2011-04-23 ENCOUNTER — Telehealth: Payer: Self-pay | Admitting: Internal Medicine

## 2011-04-23 MED ORDER — EZETIMIBE 10 MG PO TABS: 10.0000 mg | ORAL_TABLET | Freq: Every day | ORAL | Status: DC

## 2011-04-23 MED ORDER — ROSUVASTATIN CALCIUM 40 MG PO TABS: 40.0000 mg | ORAL_TABLET | Freq: Every day | ORAL | Status: DC

## 2011-04-23 MED ORDER — METOPROLOL SUCCINATE ER 50 MG PO TB24: 50.0000 mg | ORAL_TABLET | Freq: Every day | ORAL | Status: DC

## 2011-04-23 NOTE — Telephone Encounter (Signed)
exetimide 10 mg, crestor 40mg  and metoprolol 50mg  written rx needed mailed to her

## 2011-04-23 NOTE — Telephone Encounter (Signed)
Prescriptions signed by Dr Haroldine Laws and mailed to pt

## 2011-05-01 ENCOUNTER — Other Ambulatory Visit: Payer: Self-pay

## 2011-05-01 ENCOUNTER — Telehealth: Payer: Self-pay | Admitting: Internal Medicine

## 2011-05-01 MED ORDER — EZETIMIBE 10 MG PO TABS: 10.0000 mg | ORAL_TABLET | Freq: Every day | ORAL | Status: DC

## 2011-05-01 MED ORDER — ROSUVASTATIN CALCIUM 40 MG PO TABS: 40.0000 mg | ORAL_TABLET | Freq: Every day | ORAL | Status: DC

## 2011-05-01 MED ORDER — METOPROLOL SUCCINATE ER 50 MG PO TB24: 50.0000 mg | ORAL_TABLET | Freq: Every day | ORAL | Status: DC

## 2011-05-01 NOTE — Telephone Encounter (Signed)
New Problem   Patient insists that she be given a paper prescription to take to the [facility] hospital for her refills. She can be reached at hm# [phone]

## 2011-05-02 NOTE — Telephone Encounter (Signed)
F/U  Patient returning nurse call regarding paper RX request, she can be reached at hm#

## 2011-05-02 NOTE — Telephone Encounter (Signed)
Please schedule with another provider. thanks

## 2011-05-02 NOTE — Telephone Encounter (Signed)
Rx mailed.  Pt requesting a f/u with Dr Haroldine Laws.  Does she need to establish with a new cardiologist or will she be seen at the Heart Failure clinic?

## 2011-05-06 ENCOUNTER — Ambulatory Visit (INDEPENDENT_AMBULATORY_CARE_PROVIDER_SITE_OTHER)
Admission: RE | Admit: 2011-05-06 | Discharge: 2011-05-06 | Disposition: A | Discharge status: Home / Self Care | Payer: Medicare Other | Source: Ambulatory Visit

## 2011-05-06 DIAGNOSIS — M899 Disorder of bone, unspecified: Secondary | ICD-10-CM | POA: Diagnosis not present

## 2011-05-06 DIAGNOSIS — M949 Disorder of cartilage, unspecified: Secondary | ICD-10-CM | POA: Diagnosis not present

## 2011-05-06 DIAGNOSIS — M8580 Other specified disorders of bone density and structure, unspecified site: Secondary | ICD-10-CM

## 2011-05-14 ENCOUNTER — Encounter: Payer: Self-pay | Admitting: Internal Medicine

## 2011-07-22 DIAGNOSIS — H353190 Nonexudative age-related macular degeneration, unspecified eye, stage unspecified: Secondary | ICD-10-CM | POA: Diagnosis not present

## 2011-07-24 DIAGNOSIS — H26499 Other secondary cataract, unspecified eye: Secondary | ICD-10-CM | POA: Diagnosis not present

## 2011-07-27 ENCOUNTER — Encounter: Payer: Self-pay | Admitting: Internal Medicine

## 2011-07-27 DIAGNOSIS — M810 Age-related osteoporosis without current pathological fracture: Secondary | ICD-10-CM | POA: Insufficient documentation

## 2011-08-06 ENCOUNTER — Encounter: Payer: Self-pay | Admitting: Internal Medicine

## 2011-08-06 NOTE — Progress Notes (Signed)
Quick Note:  Attempt to call- VM - LMTCB to disuss dr. Velora Mediate instructions - letter sent to home address - next appt with [place] is not until 03/2012 ______

## 2011-08-07 ENCOUNTER — Telehealth: Payer: Self-pay

## 2011-08-07 NOTE — Telephone Encounter (Signed)
Pt states she is going to get the vitamins that Dr. Regis Bill 1000 vit d and 1200 calcium.  Pt states she will

## 2011-08-07 NOTE — Telephone Encounter (Signed)
VM from pt - got message about density test - has more questions  - call at work until 430pm , after 430 - call hm#

## 2011-08-11 ENCOUNTER — Encounter: Payer: Self-pay | Admitting: Cardiovascular Disease

## 2011-08-11 ENCOUNTER — Ambulatory Visit (INDEPENDENT_AMBULATORY_CARE_PROVIDER_SITE_OTHER): Payer: Medicare Other | Admitting: Cardiovascular Disease

## 2011-08-11 VITALS — BP 100/52 | HR 59 | Resp 18 | Ht 61.0 in | Wt 133.8 lb

## 2011-08-11 DIAGNOSIS — I6529 Occlusion and stenosis of unspecified carotid artery: Secondary | ICD-10-CM | POA: Diagnosis not present

## 2011-08-11 DIAGNOSIS — I2510 Atherosclerotic heart disease of native coronary artery without angina pectoris: Secondary | ICD-10-CM | POA: Diagnosis not present

## 2011-08-11 NOTE — Assessment & Plan Note (Signed)
She is known to have bilateral 60-79% stenoses. Last doppler 8/12. Will repeat carotid dopplers.

## 2011-08-11 NOTE — Assessment & Plan Note (Addendum)
Stable s/p CABG in 2004. She is on good medical therapy. Will continue current meds including ASA/Plavix, Crestor, Toprol, Micardis, Zetia. She will need f/u lipids and LFTs in August 2013.

## 2011-08-11 NOTE — Patient Instructions (Signed)
Your physician wants you to follow-up in: 6 months. You will receive a reminder letter in the mail two months in advance. If you don't receive a letter, please call our office to schedule the follow-up appointment.  Your physician has requested that you have a carotid duplex. This test is an ultrasound of the carotid arteries in your neck. It looks at blood flow through these arteries that supply the brain with blood. Allow one hour for this exam. There are no restrictions or special instructions.   Your physician recommends that you return for fasting lab work in: late August.--Lipid and Liver profile.

## 2011-08-11 NOTE — Progress Notes (Signed)
History of Present Illness:  78 yo WF with history of coronary artery disease, status post two-vessel bypass grafting in December 2004, HTN, HLD,  bilateral carotid stenoses, AAA, as well as elevated liver enzymes on Lipitor who is here today for cardiac follow up. She has been followed in the past by Dr. Haroldine Laws and is here today for the first time to see me. Carotid u/s 11/18/10 showed stable disease 60-79% bilaterally.  Abdominal u/s 11/18/10  showed very small AAA 2.1x2.1 cm, stable. She had c/o diplopia in 2/12 and repeat MRI/MRA were scheduled and results were without acute changes. She saw Neurology in 2012 and was released.   Returns for routine follow up today. She is doing well. She denies CP, orthopnea, dyspnea or PND. She recently had a laser procedure on her right eye and vision is better. Occasional double vision while watching TV at night. No leg pains during the day  Primary Care Physician: Shanon Ace  Last Lipid Profile:  Lipid Panel     Component Value Date/Time   CHOL 99 11/18/2010 0928   TRIG 101.0 11/18/2010 0928   HDL 50.40 11/18/2010 0928   CHOLHDL 2 11/18/2010 0928   VLDL 20.2 11/18/2010 0928   LDLCALC 28 11/18/2010 [id number]     Past Medical History  Diagnosis Date  . CAD (coronary artery disease)     including LM disease; s/p CABG  . HTN (hypertension)   . Hyperlipidemia   . Bilateral carotid artery stenosis   . OA (osteoarthritis)     severe; s/p bilateral hip replacement  . History of colon cancer     s/p resection in 2004  . AAA (abdominal aortic aneurysm)     small  . History of colonoscopy   . History of chickenpox     Past Surgical History  Procedure Date  . Partial colectomy 2004    sigmoid resection  . Left hip replacement 2007  . Right hip replacement 2008  . Coronary artery bypass graft 2004  . Refractive surgery 2013    had a film over eye removed.    Current Outpatient Prescriptions  Medication Sig Dispense Refill  . aspirin (ADULT  ASPIRIN EC LOW STRENGTH) 81 MG EC tablet Take 81 mg by mouth daily.        . clopidogrel (PLAVIX) 75 MG tablet Take 1 tablet (75 mg total) by mouth daily.  90 tablet  3  . ezetimibe (ZETIA) 10 MG tablet Take 1 tablet (10 mg total) by mouth daily.  90 tablet  3  . metoprolol succinate (TOPROL-XL) 50 MG 24 hr tablet Take 1 tablet (50 mg total) by mouth daily.  90 tablet  3  . rosuvastatin (CRESTOR) 40 MG tablet Take 1 tablet (40 mg total) by mouth daily.  90 tablet  3  . telmisartan (MICARDIS) 20 MG tablet Take 1 tablet (20 mg total) by mouth daily.  90 tablet  3  . vitamin C (ASCORBIC ACID) 250 MG tablet Take 500 mg by mouth daily.         No Known Allergies  History   Social History  . Marital Status: Widowed    Spouse Name: N/A    Number of Children: 3  . Years of Education: N/A   Occupational History  . 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 Smoking status: Never Smoker   . Smokeless tobacco: Not on file  . Alcohol Use: No  . Drug Use: No  .  Sexually Active: Not on file   Other Topics Concern  . 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  Has smoke detector and wears seat belts. . No excess sun exposure. . No depression    Family History  Problem Relation Age of Onset  . Cancer Mother     intra-abdominal/gallbladder  . Cholelithiasis Mother   . Heart attack Father     68  . Heart disease Father   . Colon cancer Neg Hx   . Diabetes Sister     62de  . Other Brother     GSW 45     Review of Systems:  As stated in the HPI and otherwise negative.   BP 100/52  Pulse 59  Resp 18  Ht 5\' 1"  (1.549 m)  Wt 133 lb 12.8 oz (60.691 kg)  BMI 25.28  kg/m2  Physical Examination: General: Well developed, well nourished, NAD HEENT: OP clear, mucus membranes moist SKIN: warm, dry. No rashes. Neuro: No focal deficits Musculoskeletal: Muscle strength 5/5 all ext Psychiatric: Mood and affect normal Neck: No JVD, no carotid bruits, no thyromegaly, no lymphadenopathy. Lungs:Clear bilaterally, no wheezes, rhonci, crackles Cardiovascular: Regular rate and rhythm. No murmurs, gallops or rubs. Abdomen:Soft. Bowel sounds present. Non-tender.  Extremities: No lower extremity edema. Pulses are 2 + in the bilateral DP/PT.  EKG: Sinus brady, rate 59 bpm. Low voltage. Incomplete RBBB

## 2011-08-20 ENCOUNTER — Encounter (INDEPENDENT_AMBULATORY_CARE_PROVIDER_SITE_OTHER): Payer: Medicare Other

## 2011-08-20 DIAGNOSIS — I6529 Occlusion and stenosis of unspecified carotid artery: Secondary | ICD-10-CM | POA: Diagnosis not present

## 2011-08-22 ENCOUNTER — Telehealth: Payer: Self-pay | Admitting: Cardiovascular Disease

## 2011-08-22 NOTE — Telephone Encounter (Signed)
Spoke with pt and reviewed carotid doppler results with her.  

## 2011-08-22 NOTE — Telephone Encounter (Signed)
Fu call °Patient returning your call °

## 2011-11-17 ENCOUNTER — Encounter: Payer: Self-pay | Admitting: Gastroenterology

## 2011-11-18 ENCOUNTER — Other Ambulatory Visit (INDEPENDENT_AMBULATORY_CARE_PROVIDER_SITE_OTHER): Payer: Medicare Other

## 2011-11-18 DIAGNOSIS — I2510 Atherosclerotic heart disease of native coronary artery without angina pectoris: Secondary | ICD-10-CM

## 2011-11-18 LAB — HEPATIC FUNCTION PANEL
ALT: 20 U/L (ref 0–35)
AST: 22 U/L (ref 0–37)
Bilirubin, Direct: 0.1 mg/dL (ref 0.0–0.3)
Total Protein: 6.3 g/dL (ref 6.0–8.3)

## 2011-11-18 LAB — LIPID PANEL
Total CHOL/HDL Ratio: 2
Triglycerides: 140 mg/dL (ref 0.0–149.0)

## 2012-02-11 DIAGNOSIS — H35369 Drusen (degenerative) of macula, unspecified eye: Secondary | ICD-10-CM | POA: Diagnosis not present

## 2012-02-13 ENCOUNTER — Other Ambulatory Visit: Payer: Self-pay | Admitting: *Deleted

## 2012-02-13 ENCOUNTER — Encounter: Payer: Self-pay | Admitting: Cardiovascular Disease

## 2012-02-13 ENCOUNTER — Ambulatory Visit (INDEPENDENT_AMBULATORY_CARE_PROVIDER_SITE_OTHER): Payer: Medicare Other | Admitting: Cardiovascular Disease

## 2012-02-13 VITALS — BP 126/64 | HR 63 | Ht 61.0 in | Wt 135.0 lb

## 2012-02-13 DIAGNOSIS — I6529 Occlusion and stenosis of unspecified carotid artery: Secondary | ICD-10-CM | POA: Diagnosis not present

## 2012-02-13 DIAGNOSIS — I2510 Atherosclerotic heart disease of native coronary artery without angina pectoris: Secondary | ICD-10-CM | POA: Diagnosis not present

## 2012-02-13 MED ORDER — TELMISARTAN 20 MG PO TABS: 20.0000 mg | ORAL_TABLET | Freq: Every day | ORAL | Status: DC

## 2012-02-13 MED ORDER — EZETIMIBE 10 MG PO TABS: 10.0000 mg | ORAL_TABLET | Freq: Every day | ORAL | Status: DC

## 2012-02-13 MED ORDER — CLOPIDOGREL BISULFATE 75 MG PO TABS: 75.0000 mg | ORAL_TABLET | Freq: Every day | ORAL | Status: DC

## 2012-02-13 MED ORDER — ROSUVASTATIN CALCIUM 40 MG PO TABS: 40.0000 mg | ORAL_TABLET | Freq: Every day | ORAL | Status: DC

## 2012-02-13 MED ORDER — METOPROLOL SUCCINATE ER 50 MG PO TB24: 50.0000 mg | ORAL_TABLET | Freq: Every day | ORAL | Status: DC

## 2012-02-13 NOTE — Progress Notes (Signed)
History of Present Illness:78 yo WF with history of coronary artery disease, status post two-vessel bypass grafting in December 2004, HTN, HLD, bilateral carotid stenoses, AAA, as well as elevated liver enzymes on Lipitor who is here today for cardiac follow up. She has been followed in the past by Dr. Haroldine Laws. Carotid u/s May 2013 with stable disease 60-79% bilaterally. Abdominal u/s 11/18/10 showed very small AAA 2.1x 2.1 cm, stable. She had c/o diplopia in 2/12 and repeat MRI/MRA were scheduled and results were without acute changes. She saw Neurology in 2012 and was released.   Returns for routine follow up today. She is doing well. She denies CP, orthopnea, dyspnea or PND. She has some left hip pain which is chronic. No claudication.   Primary Care Physician: Shanon Ace   Last Lipid Profile:Lipid Panel     Component Value Date/Time   CHOL 99 11/18/2011 0843   TRIG 140.0 11/18/2011 0843   HDL 45.30 11/18/2011 0843   CHOLHDL 2 11/18/2011 0843   VLDL 28.0 11/18/2011 0843   LDLCALC 26 11/18/2011 0843     Past Medical History  Diagnosis Date  . CAD (coronary artery disease)     including LM disease; s/p CABG  . HTN (hypertension)   . Hyperlipidemia   . Bilateral carotid artery stenosis   . OA (osteoarthritis)     severe; s/p bilateral hip replacement  . History of colon cancer     s/p resection in 2004  . AAA (abdominal aortic aneurysm)     small  . History of colonoscopy   . History of chickenpox     Past Surgical History  Procedure Date  . Partial colectomy 2004    sigmoid resection  . Left hip replacement 2007  . Right hip replacement 2008  . Coronary artery bypass graft 2004  . Refractive surgery 2013    had a film over eye removed.    Current Outpatient Prescriptions  Medication Sig Dispense Refill  . aspirin (ADULT ASPIRIN EC LOW STRENGTH) 81 MG EC tablet Take 81 mg by mouth daily.        Marland Kitchen CALCIUM-MAGNESUIUM-ZINC [id number] MG TABS Take by mouth.      .  Cholecalciferol (VITAMIN D PO) Take 2,000 mg by mouth daily.      . clopidogrel (PLAVIX) 75 MG tablet Take 1 tablet (75 mg total) by mouth daily.  90 tablet  3  . ezetimibe (ZETIA) 10 MG tablet Take 1 tablet (10 mg total) by mouth daily.  90 tablet  3  . metoprolol succinate (TOPROL-XL) 50 MG 24 hr tablet Take 1 tablet (50 mg total) by mouth daily.  90 tablet  3  . Multiple Vitamin (MULITIVITAMIN WITH MINERALS) TABS Take 1 tablet by mouth daily.      . rosuvastatin (CRESTOR) 40 MG tablet Take 1 tablet (40 mg total) by mouth daily.  90 tablet  3  . telmisartan (MICARDIS) 20 MG tablet Take 1 tablet (20 mg total) by mouth daily.  90 tablet  3  . vitamin C (ASCORBIC ACID) 250 MG tablet Take 500 mg by mouth daily.         No Known Allergies  History   Social History  . Marital Status: Widowed    Spouse Name: N/A    Number of Children: 3  . Years of Education: N/A   Occupational History  . 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 Smoking status: Never Smoker   .  Smokeless tobacco: Not on file  . Alcohol Use: No  . Drug Use: No  . Sexually Active: Not on file   Other Topics Concern  . 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  Has smoke detector and wears seat belts. . No excess sun exposure. . No depression    Family History  Problem Relation Age of Onset  . Cancer Mother     intra-abdominal/gallbladder  . Cholelithiasis Mother   . Heart attack Father     63  . Heart disease Father   . Colon cancer Neg Hx   . Diabetes Sister     28de  . Other Brother     GSW 45     Review of Systems:  As stated in the HPI and otherwise negative.   BP 126/64  Pulse  63  Ht 5\' 1"  (1.549 m)  Wt 135 lb (61.236 kg)  BMI 25.51 kg/m2  SpO2 99%  Physical Examination: General: Well developed, well nourished, NAD HEENT: OP clear, mucus membranes moist SKIN: warm, dry. No rashes. Neuro: No focal deficits Musculoskeletal: Muscle strength 5/5 all ext Psychiatric: Mood and affect normal Neck: No JVD, faint bilateral carotid bruits, no thyromegaly, no lymphadenopathy. Lungs:Clear bilaterally, no wheezes, rhonci, crackles Cardiovascular: Regular rate and rhythm. No murmurs, gallops or rubs. Abdomen:Soft. Bowel sounds present. Non-tender.  Extremities: No lower extremity edema. Pulses are 1 + in the bilateral DP/PT.   Assessment and Plan:   1. CAD: Stable s/p CABG in 2004. She is on good medical therapy. Will continue current meds including ASA/Plavix, Crestor, Toprol, Micardis, Zetia. Will arrange f/u lipids at next visit. Lipids have been very well controlled.   2. CAROTID ARTERY DISEASE: She is known to have bilateral 60-79% stenoses which was stable in May 2013. She has had stable disease for years. She wishes to have her carotids done once yearly so we will arrange for repeat dopplers in May 2014.

## 2012-02-13 NOTE — Patient Instructions (Addendum)
Your physician wants you to follow-up in:  12 months.You will receive a reminder letter in the mail two months in advance. If you don't receive a letter, please call our office to schedule the follow-up appointment.  Your physician has requested that you have a carotid duplex. This test is an ultrasound of the carotid arteries in your neck. It looks at blood flow through these arteries that supply the brain with blood. Allow one hour for this exam. There are no restrictions or special instructions. To be done in May 2014

## 2012-04-02 ENCOUNTER — Encounter: Payer: Self-pay | Admitting: Internal Medicine

## 2012-04-02 ENCOUNTER — Ambulatory Visit (INDEPENDENT_AMBULATORY_CARE_PROVIDER_SITE_OTHER): Payer: Medicare Other | Admitting: Internal Medicine

## 2012-04-02 VITALS — BP 146/70 | HR 72 | Temp 98.2°F | Ht 60.25 in | Wt 135.0 lb

## 2012-04-02 DIAGNOSIS — I6523 Occlusion and stenosis of bilateral carotid arteries: Secondary | ICD-10-CM

## 2012-04-02 DIAGNOSIS — I2510 Atherosclerotic heart disease of native coronary artery without angina pectoris: Secondary | ICD-10-CM | POA: Diagnosis not present

## 2012-04-02 DIAGNOSIS — Z23 Encounter for immunization: Secondary | ICD-10-CM

## 2012-04-02 DIAGNOSIS — Z0000 Encounter for general adult medical examination without abnormal findings: Secondary | ICD-10-CM

## 2012-04-02 DIAGNOSIS — I10 Essential (primary) hypertension: Secondary | ICD-10-CM | POA: Diagnosis not present

## 2012-04-02 DIAGNOSIS — E785 Hyperlipidemia, unspecified: Secondary | ICD-10-CM | POA: Diagnosis not present

## 2012-04-02 DIAGNOSIS — M1990 Unspecified osteoarthritis, unspecified site: Secondary | ICD-10-CM

## 2012-04-02 DIAGNOSIS — M810 Age-related osteoporosis without current pathological fracture: Secondary | ICD-10-CM | POA: Diagnosis not present

## 2012-04-02 DIAGNOSIS — Z9889 Other specified postprocedural states: Secondary | ICD-10-CM

## 2012-04-02 DIAGNOSIS — Z974 Presence of external hearing-aid: Secondary | ICD-10-CM

## 2012-04-02 DIAGNOSIS — I658 Occlusion and stenosis of other precerebral arteries: Secondary | ICD-10-CM

## 2012-04-02 LAB — CBC WITH DIFFERENTIAL/PLATELET
Basophils Relative: 0.5 % (ref 0.0–3.0)
Eosinophils Absolute: 0 10*3/uL (ref 0.0–0.7)
Eosinophils Relative: 0.7 % (ref 0.0–5.0)
HCT: 35.9 % — ABNORMAL LOW (ref 36.0–46.0)
Hemoglobin: 12.3 g/dL (ref 12.0–15.0)
MCHC: 34.3 g/dL (ref 30.0–36.0)
MCV: 84 fl (ref 78.0–100.0)
Monocytes Absolute: 0.3 10*3/uL (ref 0.1–1.0)
Neutro Abs: 3.4 10*3/uL (ref 1.4–7.7)
RBC: 4.27 Mil/uL (ref 3.87–5.11)
WBC: 4.6 10*3/uL (ref 4.5–10.5)

## 2012-04-02 LAB — BASIC METABOLIC PANEL
CO2: 24 mEq/L (ref 19–32)
Chloride: 108 mEq/L (ref 96–112)
Potassium: 4.2 mEq/L (ref 3.5–5.1)
Sodium: 139 mEq/L (ref 135–145)

## 2012-04-02 NOTE — Patient Instructions (Signed)
Will notify you  of labs when available.  Continue lifestyle intervention healthy eating and exercise .  Get 1000 iu vit d and 12 mg calcium per day .   Osteoporosis Throughout your life, your body breaks down old bone and replaces it with new bone. As you get older, your body does not replace bone as quickly as it breaks it down. By the age of 64 years, most people begin to gradually lose bone because of the imbalance between bone loss and replacement. Some people lose more bone than others. Bone loss beyond a specified normal degree is considered osteoporosis.  Osteoporosis affects the strength and durability of your bones. The inside of the ends of your bones and your flat bones, like the bones of your pelvis, look like honeycomb, filled with tiny open spaces. As bone loss occurs, your bones become less dense. This means that the open spaces inside your bones become bigger and the walls between these spaces become thinner. This makes your bones weaker. Bones of a person with osteoporosis can become so weak that they can break (fracture) during minor accidents, such as a simple fall. CAUSES  The following factors have been associated with the development of osteoporosis:  Smoking.  Drinking more than 2 alcoholic drinks several days per week.  Long-term use of certain medicines:  Corticosteroids.  Chemotherapy medicines.  Thyroid medicines.  Antiepileptic medicines.  Gonadal hormone suppression medicine.  Immunosuppression medicine.  Being underweight.  Lack of physical activity.  Lack of exposure to the sun. This can lead to vitamin D deficiency.  Certain medical conditions:  Certain inflammatory bowel diseases, such as Crohn's disease and ulcerative colitis.  Diabetes.  Hyperthyroidism.  Hyperparathyroidism. RISK FACTORS Anyone can develop osteoporosis. However, the following factors can increase your risk of developing osteoporosis:  Gender Women are at higher risk  than men.  Age Being older than 18 years increases your risk.  Ethnicity White and Asian people have an increased risk.  Weight Being extremely underweight can increase your risk of osteoporosis.  Family history of osteoporosis Having a family member who has developed osteoporosis can increase your risk. SYMPTOMS  Usually, people with osteoporosis have no symptoms.  DIAGNOSIS  Signs during a physical exam that may prompt your caregiver to suspect osteoporosis include:  Decreased height. This is usually caused by the compression of the bones that form your spine (vertebrae) because they have weakened and become fractured.  A curving or rounding of the upper back (kyphosis). To confirm signs of osteoporosis, your caregiver may request a procedure that uses 2 low-dose X-ray beams with different levels of energy to measure your bone mineral density (dual-energy X-ray absorptiometry [DXA]). Also, your caregiver may check your level of vitamin D. TREATMENT  The goal of osteoporosis treatment is to strengthen bones in order to decrease the risk of bone fractures. There are different types of medicines available to help achieve this goal. Some of these medicines work by slowing the processes of bone loss. Some medicines work by increasing bone density. Treatment also involves making sure that your levels of calcium and vitamin D are adequate. PREVENTION  There are things you can do to help prevent osteoporosis. Adequate intake of calcium and vitamin D can help you achieve optimal bone mineral density. Regular exercise can also help, especially resistance and high-impact activities. If you smoke, quitting smoking is an important part of osteoporosis prevention. MAKE SURE YOU:  Understand these instructions.  Will watch your condition.  Will get help  right away if you are not doing well or get worse. Document Released: 12/18/2004 Document Revised: 06/02/2011 Document Reviewed:  02/22/2011 [facility] Patient Information 2013 [place].

## 2012-04-02 NOTE — Progress Notes (Signed)
Chief Complaint  Patient presents with  . Annual Exam    Medicare    HPI: Patient comes in today for Preventive Medicare wellness visit . Here with gd family member   Helps with paperwork.  No major injuries, ed visits ,hospitalizations , new medications since last visit. Has had CARDS check and on a yearly basis now  No se of meds  Taking vit d and calcium no falls of fractures. Sees dr Bing Plume yearly . 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 Last eye check UTD  Safety:  Has smoke detector and wears seat belts.  No firearms. No excess sun exposure. Sees dentist regularly.  Falls: NO  Advance directive :  Reviewed  Has one.  Memory: Felt to be good  , no concern from her or her family.  Depression: No anhedonia unusual crying or depressive symptoms  Nutrition: Eats well balanced diet; adequate calcium and vitamin D. No swallowing chewing problems.  Injury: no major injuries in the last six months.  Other healthcare providers:  Reviewed today .  Social:  Lives alone . No pets.   Preventive parameters: up-to-date  Reviewed   ADLS:   There are no problems or need for assistance  driving, feeding, obtaining food, dressing, toileting and bathing, managing money using phone. She is independent. 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 GI /GU: No adominal pain, vomiting, change in bowel habits. No blood in the stool. No significant GU symptoms. SKIN/HEME: ,no acute skin rashes suspicious lesions or bleeding. No lymphadenopathy, nodules, masses.  NEURO/ PSYCH:  No neurologic signs such as weakness numbness. No  depression anxiety. IMM/ Allergy: No unusual infections.  Allergy .   REST of 12 system review negative except as per HPI   Past Medical History  Diagnosis Date  . CAD (coronary artery disease)     including LM disease; s/p CABG  . HTN (hypertension)   . Hyperlipidemia   . Bilateral carotid artery stenosis   . OA (osteoarthritis)     severe; s/p bilateral hip replacement  . History of colon cancer     s/p resection in 2004  . AAA (abdominal aortic aneurysm)     small  . History of colonoscopy   . History of chickenpox     Family History  Problem Relation Age of Onset  . Cancer Mother     intra-abdominal/gallbladder  . Cholelithiasis Mother   . Heart attack Father     53  . Heart disease Father   . Colon cancer Neg Hx   . Diabetes Sister     29de  . Other Brother     GSW 67     History   Social History  . Marital Status: Widowed    Spouse Name: N/A    Number of Children: 3  . Years of Education: N/A   Occupational History  . 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 Smoking status: Never Smoker   . Smokeless tobacco: None  . Alcohol Use: No  .  Drug Use: No  . Sexually Active: None   Other Topics Concern  . 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  Has smoke detector and wears seat belts. . No excess sun exposure. . No depression    Outpatient Encounter Prescriptions as of 04/02/2012  Medication Sig Dispense Refill  . aspirin (ADULT ASPIRIN EC LOW STRENGTH) 81 MG EC tablet Take 81 mg by mouth daily.        Marland Kitchen CALCIUM-MAGNESUIUM-ZINC [id number] MG TABS Take by mouth.      . Cholecalciferol (VITAMIN D3) 2000 UNITS TABS Take by mouth.       . clopidogrel (PLAVIX) 75 MG tablet Take 1 tablet (75 mg total) by mouth daily.  90 tablet  3  . ezetimibe (ZETIA) 10 MG tablet Take 1 tablet (10 mg total) by mouth daily.  90 tablet  3  . metoprolol succinate (TOPROL-XL) 50 MG 24 hr tablet Take 1 tablet (50 mg total) by mouth daily.  90 tablet  3  . Multiple Vitamin (MULITIVITAMIN WITH MINERALS) TABS Take 1 tablet by mouth daily.      . rosuvastatin (CRESTOR) 40 MG tablet Take 1 tablet (40 mg total) by mouth daily.  90 tablet  3  . telmisartan (MICARDIS) 20 MG tablet Take 1 tablet (20 mg total) by mouth daily.  90 tablet  3  . vitamin C (ASCORBIC ACID) 500 MG tablet Take 500 mg by mouth daily.      . [DISCONTINUED] Cholecalciferol (VITAMIN D PO) Take 2,000 mg by mouth daily.      . [DISCONTINUED] vitamin C (ASCORBIC ACID) 250 MG tablet Take 500 mg by mouth daily.         EXAM:  BP 146/70  Pulse 72  Temp 98.2 F (36.8 C) (Oral)  Ht 5' 0.25" (1.53 m)  Wt 135 lb (61.236 kg)  BMI 26.15 kg/m2  Body mass index is 26.15 kg/(m^2).  Physical Exam: Vital signs reviewed [id number] is a well-developed well-nourished alert cooperative   who appears younger  stated age in no acute distress.  HEENT: normocephalic atraumatic , Eyes: EOM's full, conjunctiva clear, Nares: paten,t no deformity discharge or tenderness., Ears: no deformity EAC's clear TMs with normal landmarks some wax in ears tms grey . Mouth: clear OP, no lesions, edema.  Moist mucous membranes. Dentition in adequate repair upper  Dentures . NECK: supple without masses, thyromegaly or bruits. CHEST/PULM:  Clear to auscultation and percussion breath sounds equal no wheeze , rales or rhonchi. No chest wall deformities or tenderness. CV: PMI is nondisplaced, S1 S2 no gallops, murmurs, rubs. Peripheral pulses are full without delay.No JVD .  ABDOMEN: Bowel sounds normal nontender  No guard or rebound, no hepato splenomegal no CVA tenderness.  No hernia. Breast: normal by inspection . No  dimpling, discharge, masses, tenderness or discharge .  Extremtities:  No clubbing cyanosis or edema, no acute joint swelling or redness no focal atrophy some mild oa right foot bunion and some redness at top of foot no ulcer   Nl pulses  NEURO:  Oriented x3, cranial nerves 3-12 appear to be intact, no obvious focal weakness,gait within normal limits no abnormal reflexes or asymmetrical SKIN: No  acute rashes normal turgor, color, no bruising or petechiae. PSYCH: Oriented, good eye contact, no obvious depression anxiety, cognition and judgment appear normal. LN: no cervical axillary inguinal adenopathy No noted deficits in memory, attention, and speech.   Lab Results  Component Value Date   WBC 4.6 04/02/2012   HGB 12.3 04/02/2012   HCT 35.9* 04/02/2012   PLT 197.0 04/02/2012   GLUCOSE 110* 04/02/2012   CHOL 99 11/18/2011   TRIG 140.0 11/18/2011   HDL 45.30 11/18/2011   LDLDIRECT 89.5 08/06/2007   LDLCALC 26 11/18/2011   ALT 20 11/18/2011   AST 22 11/18/2011   NA 139 04/02/2012   K 4.2 04/02/2012   CL 108 04/02/2012   CREATININE 1.3* 04/02/2012   BUN 24* 04/02/2012   CO2 24 04/02/2012   TSH 2.86 04/02/2012    ASSESSMENT AND PLAN:  Discussed the following assessment and plan:  1. Medicare annual wellness visit, subsequent     not getting mammos and colons for now disc zostavax if wishes . bone health   2. Need for prophylactic vaccination and inoculation against influenza  Cholecalciferol (VITAMIN D3) 2000 UNITS TABS, vitamin C (ASCORBIC ACID) 500 MG tablet, Basic metabolic panel, CBC with Differential, TSH, Vitamin D 25 hydroxy  3. Osteoporosis  Cholecalciferol (VITAMIN D3) 2000 UNITS TABS, vitamin C (ASCORBIC ACID) 500 MG tablet, Basic metabolic panel, CBC with Differential, TSH, Vitamin D 25 hydroxy   dexa 2013 forarm spine normal no fractures or hx. disc meds if wishes plan repeat dexa in 2015   4. HYPERLIPIDEMIA NEC/NOS  Cholecalciferol (VITAMIN D3) 2000 UNITS TABS, vitamin C (ASCORBIC  ACID) 500 MG tablet, Basic metabolic panel, CBC with Differential, TSH, Vitamin D 25 hydroxy  5. HTN (hypertension)  Cholecalciferol (VITAMIN D3) 2000 UNITS TABS, vitamin C (ASCORBIC ACID) 500 MG tablet, Basic metabolic panel, CBC with Differential, TSH, Vitamin D 25 hydroxy  6. Hyperlipidemia  Cholecalciferol (VITAMIN D3) 2000 UNITS TABS, vitamin C (ASCORBIC ACID) 500 MG tablet, Basic metabolic panel, CBC with Differential, TSH, Vitamin D 25 hydroxy  7. OA (osteoarthritis)  Cholecalciferol (VITAMIN D3) 2000 UNITS TABS, vitamin C (ASCORBIC ACID) 500 MG tablet, Basic metabolic panel, CBC with Differential, TSH, Vitamin D 25 hydroxy  8. CAD (coronary artery disease)    9. Bilateral carotid artery stenosis    10. Wears hearing aid    dsic foot care right bunion and  Fu if needed not at high risk.  Counseled. osteoprosis  No fx balance issue or fam hx . Will follow for now disc  Fosamax if wishes and check vit d today.  Can get zostavax if wishes  Patient Care Team: Burnis Medin, MD as PCP - General (Internal Medicine) Mauri Pole, MD (Orthopedic Surgery) Jolaine Artist, MD (Cardiology) Burnell Blanks, MD as Attending Physician (Cardiology) Linton Rump, MD as Attending Physician (Ophthalmology)  Patient Instructions  Will notify you  of labs when available.  Continue lifestyle intervention healthy eating and exercise .  Get 1000 iu vit d and 12 mg calcium per day .   Osteoporosis Throughout your life, your body breaks down old bone and replaces it with new bone. As you get older, your body does not replace bone as quickly as it breaks it down. By the age of 1 years, most people begin to gradually lose bone because of the imbalance between bone loss and replacement. Some people lose more bone than others. Bone loss beyond a specified normal degree is considered osteoporosis.  Osteoporosis  affects the strength and durability of your bones. The inside of the ends of your bones  and your flat bones, like the bones of your pelvis, look like honeycomb, filled with tiny open spaces. As bone loss occurs, your bones become less dense. This means that the open spaces inside your bones become bigger and the walls between these spaces become thinner. This makes your bones weaker. Bones of a person with osteoporosis can become so weak that they can break (fracture) during minor accidents, such as a simple fall. CAUSES  The following factors have been associated with the development of osteoporosis:  Smoking.  Drinking more than 2 alcoholic drinks several days per week.  Long-term use of certain medicines:  Corticosteroids.  Chemotherapy medicines.  Thyroid medicines.  Antiepileptic medicines.  Gonadal hormone suppression medicine.  Immunosuppression medicine.  Being underweight.  Lack of physical activity.  Lack of exposure to the sun. This can lead to vitamin D deficiency.  Certain medical conditions:  Certain inflammatory bowel diseases, such as Crohn's disease and ulcerative colitis.  Diabetes.  Hyperthyroidism.  Hyperparathyroidism. RISK FACTORS Anyone can develop osteoporosis. However, the following factors can increase your risk of developing osteoporosis:  Gender Women are at higher risk than men.  Age Being older than 16 years increases your risk.  Ethnicity White and Asian people have an increased risk.  Weight Being extremely underweight can increase your risk of osteoporosis.  Family history of osteoporosis Having a family member who has developed osteoporosis can increase your risk. SYMPTOMS  Usually, people with osteoporosis have no symptoms.  DIAGNOSIS  Signs during a physical exam that may prompt your caregiver to suspect osteoporosis include:  Decreased height. This is usually caused by the compression of the bones that form your spine (vertebrae) because they have weakened and become fractured.  A curving or rounding of the  upper back (kyphosis). To confirm signs of osteoporosis, your caregiver may request a procedure that uses 2 low-dose X-ray beams with different levels of energy to measure your bone mineral density (dual-energy X-ray absorptiometry [DXA]). Also, your caregiver may check your level of vitamin D. TREATMENT  The goal of osteoporosis treatment is to strengthen bones in order to decrease the risk of bone fractures. There are different types of medicines available to help achieve this goal. Some of these medicines work by slowing the processes of bone loss. Some medicines work by increasing bone density. Treatment also involves making sure that your levels of calcium and vitamin D are adequate. PREVENTION  There are things you can do to help prevent osteoporosis. Adequate intake of calcium and vitamin D can help you achieve optimal bone mineral density. Regular exercise can also help, especially resistance and high-impact activities. If you smoke, quitting smoking is an important part of osteoporosis prevention. MAKE SURE YOU:  Understand these instructions.  Will watch your condition.  Will get help right away if you are not doing well or get worse. Document Released: 12/18/2004 Document Revised: 06/02/2011 Document Reviewed: 02/22/2011 [facility] Patient Information 2013 [place].    Standley Brooking. Panosh M.D.

## 2012-04-13 ENCOUNTER — Telehealth: Payer: Self-pay | Admitting: Cardiovascular Disease

## 2012-04-13 ENCOUNTER — Other Ambulatory Visit: Payer: Self-pay | Admitting: *Deleted

## 2012-04-13 MED ORDER — EZETIMIBE 10 MG PO TABS: 10.0000 mg | ORAL_TABLET | Freq: Every day | ORAL | Status: DC

## 2012-04-13 NOTE — Telephone Encounter (Signed)
Left message on voicemail.

## 2012-04-13 NOTE — Telephone Encounter (Signed)
New problem    zetia  10 mg.    # [address] [place] . [id number].

## 2012-04-15 NOTE — Telephone Encounter (Signed)
Spoke with pt and she would like this mailed to her. Will send prescription to her home.

## 2012-07-19 ENCOUNTER — Encounter: Payer: Self-pay | Admitting: Gastroenterology

## 2012-08-20 ENCOUNTER — Encounter (INDEPENDENT_AMBULATORY_CARE_PROVIDER_SITE_OTHER): Payer: Medicare Other

## 2012-08-20 DIAGNOSIS — I6529 Occlusion and stenosis of unspecified carotid artery: Secondary | ICD-10-CM | POA: Diagnosis not present

## 2012-08-25 ENCOUNTER — Telehealth: Payer: Self-pay | Admitting: *Deleted

## 2012-08-25 NOTE — Telephone Encounter (Signed)
Left message to call back  

## 2012-08-25 NOTE — Telephone Encounter (Signed)
Please call pt back.

## 2012-08-26 MED ORDER — TELMISARTAN 20 MG PO TABS: 20.0000 mg | ORAL_TABLET | Freq: Every day | ORAL | Status: DC

## 2012-08-26 NOTE — Addendum Note (Signed)
Addended by: Thompson Grayer on: 08/26/2012 10:46 AM   Modules accepted: Orders

## 2012-08-26 NOTE — Telephone Encounter (Signed)
Follow Up     Pt calling in returning call. Please call back,

## 2012-08-26 NOTE — Telephone Encounter (Signed)
Spoke with pt and reviewed carotid artery doppler results with her. She is also requesting 90 day written prescription for Micardis mailed to her.  Will have Dr. Angelena Form sign when back in office and mail to pt.

## 2012-11-16 ENCOUNTER — Encounter: Payer: Self-pay | Admitting: Internal Medicine

## 2012-11-16 ENCOUNTER — Ambulatory Visit (INDEPENDENT_AMBULATORY_CARE_PROVIDER_SITE_OTHER): Payer: Medicare Other | Admitting: Internal Medicine

## 2012-11-16 VITALS — BP 176/80 | HR 74 | Temp 98.1°F | Wt 136.0 lb

## 2012-11-16 DIAGNOSIS — Z96643 Presence of artificial hip joint, bilateral: Secondary | ICD-10-CM

## 2012-11-16 DIAGNOSIS — M25559 Pain in unspecified hip: Secondary | ICD-10-CM | POA: Diagnosis not present

## 2012-11-16 DIAGNOSIS — Z96649 Presence of unspecified artificial hip joint: Secondary | ICD-10-CM

## 2012-11-16 DIAGNOSIS — M25552 Pain in left hip: Secondary | ICD-10-CM

## 2012-11-16 DIAGNOSIS — I2510 Atherosclerotic heart disease of native coronary artery without angina pectoris: Secondary | ICD-10-CM

## 2012-11-16 MED ORDER — TRAMADOL HCL 50 MG PO TABS: ORAL_TABLET | ORAL | Status: DC

## 2012-11-16 NOTE — Patient Instructions (Addendum)
I think dr Alvan Dame should see  You about your hip buttock pain for the reasons discussed . If it is not related to the   replacement then have his advice about further treatment.   Will send our notes.  In the meantime   acetaminophen 325; take 2 every  6-8 hours  .   No more than 2400 mg per day.   Narcotic pain meds could help temporarily but can cause fogginess and risk for falliung  . So caution with taking this med   Hip Pain The hips join the upper legs to the lower pelvis. The bones, cartilage, tendons, and muscles of the hip joint perform a lot of work each day holding your body weight and allowing you to move around. Hip pain is a common symptom. It can range from a minor ache to severe pain on 1 or both hips. Pain may be felt on the inside of the hip joint near the groin, or the outside near the buttocks and upper thigh. There may be swelling or stiffness as well. It occurs more often when a person walks or performs activity. There are many reasons hip pain can develop. CAUSES  It is important to work with your caregiver to identify the cause since many conditions can impact the bones, cartilage, muscles, and tendons of the hips. Causes for hip pain include:  Broken (fractured) bones.  Separation of the thighbone from the hip socket (dislocation).  Torn cartilage of the hip joint.  Swelling (inflammation) of a tendon (tendonitis), the sac within the hip joint (bursitis), or a joint.  A weakening in the abdominal wall (hernia), affecting the nerves to the hip.  Arthritis in the hip joint or lining of the hip joint.  Pinched nerves in the back, hip, or upper thigh.  A bulging disc in the spine (herniated disc).  Rarely, bone infection or cancer. DIAGNOSIS  The location of your hip pain will help your caregiver understand what may be causing the pain. A diagnosis is based on your medical history, your symptoms, results from your physical exam, and results from diagnostic tests.  Diagnostic tests may include X-ray exams, a computerized magnetic scan (magnetic resonance imaging, MRI), or bone scan. TREATMENT  Treatment will depend on the cause of your hip pain. Treatment may include:  Limiting activities and resting until symptoms improve.  Crutches or other walking supports (a cane or brace).  Ice, elevation, and compression.  Physical therapy or home exercises.  Shoe inserts or special shoes.  Losing weight.  Medications to reduce pain.  Undergoing surgery. HOME CARE INSTRUCTIONS   Only take over-the-counter or prescription medicines for pain, discomfort, or fever as directed by your caregiver.  Put ice on the injured area:  Put ice in a plastic bag.  Place a towel between your skin and the bag.  Leave the ice on for 15-20 minutes at a time, 3-4 times a day.  Keep your leg raised (elevated) when possible to lessen swelling.  Avoid activities that cause pain.  Follow specific exercises as directed by your caregiver.  Sleep with a pillow between your legs on your most comfortable side.  Record how often you have hip pain, the location of the pain, and what it feels like. This information may be helpful to you and your caregiver.  Ask your caregiver about returning to work or sports and whether you should drive.  Follow up with your caregiver for further exams, therapy, or testing as directed. [place]  CARE IF:   Your pain or swelling continues or worsens after 1 week.  You are feeling unwell or have chills.  You have increasing difficulty with walking.  You have a loss of sensation or other new symptoms.  You have questions or concerns. SEEK IMMEDIATE MEDICAL CARE IF:   You cannot put weight on the affected hip.  You have fallen.  You have a sudden increase in pain and swelling in your hip.  You have a fever. MAKE SURE YOU:   Understand these instructions.  Will watch your condition.  Will get help right away if you are  not doing well or get worse. Document Released: 08/28/2009 Document Revised: 06/02/2011 Document Reviewed: 08/28/2009 [facility] Patient Information 2014 [place].

## 2012-11-16 NOTE — Progress Notes (Signed)
Chief Complaint  Patient presents with  . Hip Pain    Left hip pain.  Started about a year ago and has gotten much worse in the last 2 weeks.    HPI: Patient comes in today for SDA for  new problem evaluation. Hx of left "hip " pain in the past . Hx of bil hip replaments  But over the past weeks increasing pain and was  "Really" bad pain this am .   10/10 took 2 ibu  Using hot pack no falls  Had seen Alvan Dame last year for hip pain not as bad  Did x ray and said pain not from hips  Currently pain with trying to walk  No fevers  No rash no falling.   ? What to take for pain or eval  also has note from daughter about vit d vit d doses and co q 10  ROS: See pertinent positives and negatives per HPI. Lives alone   Past Medical History  Diagnosis Date  . CAD (coronary artery disease)     including LM disease; s/p CABG  . HTN (hypertension)   . Hyperlipidemia   . Bilateral carotid artery stenosis   . OA (osteoarthritis)     severe; s/p bilateral hip replacement  . History of colon cancer     s/p resection in 2004  . AAA (abdominal aortic aneurysm)     small  . History of colonoscopy   . History of chickenpox     Family History  Problem Relation Age of Onset  . Cancer Mother     intra-abdominal/gallbladder  . Cholelithiasis Mother   . Heart attack Father     44  . Heart disease Father   . Colon cancer Neg Hx   . Diabetes Sister     31de  . Other Brother     GSW 72     History   Social History  . Marital Status: Widowed    Spouse Name: N/A    Number of Children: 3  . Years of Education: N/A   Occupational History  . 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 Smoking status: Never Smoker   . Smokeless tobacco: None  . Alcohol Use: No  . Drug Use: No  . Sexual Activity: None   Other Topics Concern  . 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      g3p3  Children are well   Lives independently by herself no pets family daughter  lives close by 8 hours of sleep at least    Negative TAD   14 years of education      Hearing aid and dentures   No falls .  Has smoke detector and wears seat belts. . No excess sun exposure. . No depression             Outpatient Encounter Prescriptions as of 11/16/2012  Medication Sig Dispense Refill  . aspirin (ADULT ASPIRIN EC LOW STRENGTH) 81 MG EC tablet Take 81 mg by mouth daily.        Marland Kitchen CALCIUM-MAGNESUIUM-ZINC 333-133-8.3 MG TABS Take by mouth.      . Cholecalciferol (VITAMIN D3) 2000 UNITS TABS Take by mouth.      . clopidogrel (PLAVIX) 75 MG tablet Take 1 tablet (75 mg total) by mouth daily.  [place]  tablet  3  . ezetimibe (ZETIA) 10 MG tablet Take 1 tablet (10 mg total) by mouth daily.  90 tablet  3  . metoprolol succinate (TOPROL-XL) 50 MG 24 hr tablet Take 1 tablet (50 mg total) by mouth daily.  90 tablet  3  . Multiple Vitamin (MULITIVITAMIN WITH MINERALS) TABS Take 1 tablet by mouth daily.      . rosuvastatin (CRESTOR) 40 MG tablet Take 1 tablet (40 mg total) by mouth daily.  90 tablet  3  . telmisartan (MICARDIS) 20 MG tablet Take 1 tablet (20 mg total) by mouth daily.  90 tablet  3  . vitamin C (ASCORBIC ACID) 500 MG tablet Take 500 mg by mouth daily.      . traMADol (ULTRAM) 50 MG tablet 25 mg every 8-12 hours as needed for severe pain . Can cause drowsiness  15 tablet  0   No facility-administered encounter medications on file as of 11/16/2012.    EXAM:  BP 176/80  Pulse 74  Temp(Src) 98.1 F (36.7 C) (Oral)  Wt 136 lb (61.689 kg)  BMI 26.35 kg/m2  SpO2 98%  Body mass index is 26.35 kg/(m^2).  GENERAL: vitals reviewed and listed above, alert, oriented, appears well hydrated and in no acute distress uncomfortable in pain with walkin with limp favoring left hip  Cor rr  No edema seen  MS: moves all extremities  No back tenderness  Pain lateral hip and  buttocks area no rash  Limping    PSYCH: pleasant and cooperative, no obvious depression or anxiety  ASSESSMENT AND PLAN:  Discussed the following assessment and plan:  Hip pain, acute, left  H/O bilateral hip replacements Worsening problem no ob infection sx  . eval by orhto team  Dis pain meds aceto rx  And caution with low dose ultram if needed .  Short term rx . Refer for appt with dr Alvan Dame  Disc  Written about supplements involve cards cant guarantee purity of  supplments . -Patient advised to return or notify health care team  if symptoms worsen or persist or new concerns arise.  Patient Instructions  I think dr Alvan Dame should see  You about your hip buttock pain for the reasons discussed . If it is not related to the   replacement then have his advice about further treatment.   Will send our notes.  In the meantime   acetaminophen 325; take 2 every  6-8 hours  .   No more than 2400 mg per day.   Narcotic pain meds could help temporarily but can cause fogginess and risk for falliung  . So caution with taking this med   Hip Pain The hips join the upper legs to the lower pelvis. The bones, cartilage, tendons, and muscles of the hip joint perform a lot of work each day holding your body weight and allowing you to move around. Hip pain is a common symptom. It can range from a minor ache to severe pain on 1 or both hips. Pain may be felt on the inside of the hip joint near the groin, or the outside near the buttocks and upper thigh. There may be swelling or stiffness as well. It occurs more often when a person walks or performs activity. There are many reasons hip pain can develop. CAUSES  It is important to work with your caregiver to identify the cause since many conditions can impact the bones, cartilage, muscles, and tendons of the hips. Causes for hip pain  include:  Broken (fractured) bones.  Separation of the thighbone from the hip socket (dislocation).  Torn cartilage of the hip  joint.  Swelling (inflammation) of a tendon (tendonitis), the sac within the hip joint (bursitis), or a joint.  A weakening in the abdominal wall (hernia), affecting the nerves to the hip.  Arthritis in the hip joint or lining of the hip joint.  Pinched nerves in the back, hip, or upper thigh.  A bulging disc in the spine (herniated disc).  Rarely, bone infection or cancer. DIAGNOSIS  The location of your hip pain will help your caregiver understand what may be causing the pain. A diagnosis is based on your medical history, your symptoms, results from your physical exam, and results from diagnostic tests. Diagnostic tests may include X-ray exams, a computerized magnetic scan (magnetic resonance imaging, MRI), or bone scan. TREATMENT  Treatment will depend on the cause of your hip pain. Treatment may include:  Limiting activities and resting until symptoms improve.  Crutches or other walking supports (a cane or brace).  Ice, elevation, and compression.  Physical therapy or home exercises.  Shoe inserts or special shoes.  Losing weight.  Medications to reduce pain.  Undergoing surgery. HOME CARE INSTRUCTIONS   Only take over-the-counter or prescription medicines for pain, discomfort, or fever as directed by your caregiver.  Put ice on the injured area:  Put ice in a plastic bag.  Place a towel between your skin and the bag.  Leave the ice on for 15-20 minutes at a time, 3-4 times a day.  Keep your leg raised (elevated) when possible to lessen swelling.  Avoid activities that cause pain.  Follow specific exercises as directed by your caregiver.  Sleep with a pillow between your legs on your most comfortable side.  Record how often you have hip pain, the location of the pain, and what it feels like. This information may be helpful to you and your caregiver.  Ask your caregiver about returning to work or sports and whether you should drive.  Follow up with your  caregiver for further exams, therapy, or testing as directed. SEEK MEDICAL CARE IF:   Your pain or swelling continues or worsens after 1 week.  You are feeling unwell or have chills.  You have increasing difficulty with walking.  You have a loss of sensation or other new symptoms.  You have questions or concerns. SEEK IMMEDIATE MEDICAL CARE IF:   You cannot put weight on the affected hip.  You have fallen.  You have a sudden increase in pain and swelling in your hip.  You have a fever. MAKE SURE YOU:   Understand these instructions.  Will watch your condition.  Will get help right away if you are not doing well or get worse. Document Released: 08/28/2009 Document Revised: 06/02/2011 Document Reviewed: 08/28/2009 [facility] Patient Information 2014 [place].      Standley Brooking. Panosh M.D.

## 2012-11-24 ENCOUNTER — Ambulatory Visit: Payer: Medicare Other | Admitting: Internal Medicine

## 2012-12-06 DIAGNOSIS — M5137 Other intervertebral disc degeneration, lumbosacral region: Secondary | ICD-10-CM | POA: Diagnosis not present

## 2012-12-06 DIAGNOSIS — M4310 Spondylolisthesis, site unspecified: Secondary | ICD-10-CM | POA: Diagnosis not present

## 2012-12-07 ENCOUNTER — Other Ambulatory Visit: Payer: Self-pay | Admitting: *Deleted

## 2012-12-07 MED ORDER — CLOPIDOGREL BISULFATE 75 MG PO TABS: 75.0000 mg | ORAL_TABLET | Freq: Every day | ORAL | Status: DC

## 2012-12-08 ENCOUNTER — Other Ambulatory Visit: Payer: Self-pay | Admitting: *Deleted

## 2012-12-08 DIAGNOSIS — M5137 Other intervertebral disc degeneration, lumbosacral region: Secondary | ICD-10-CM | POA: Diagnosis not present

## 2012-12-08 MED ORDER — CLOPIDOGREL BISULFATE 75 MG PO TABS: 75.0000 mg | ORAL_TABLET | Freq: Every day | ORAL | Status: DC

## 2012-12-13 DIAGNOSIS — M5137 Other intervertebral disc degeneration, lumbosacral region: Secondary | ICD-10-CM | POA: Diagnosis not present

## 2012-12-17 DIAGNOSIS — M5137 Other intervertebral disc degeneration, lumbosacral region: Secondary | ICD-10-CM | POA: Diagnosis not present

## 2012-12-20 DIAGNOSIS — M5137 Other intervertebral disc degeneration, lumbosacral region: Secondary | ICD-10-CM | POA: Diagnosis not present

## 2012-12-22 DIAGNOSIS — M5137 Other intervertebral disc degeneration, lumbosacral region: Secondary | ICD-10-CM | POA: Diagnosis not present

## 2012-12-28 DIAGNOSIS — M5137 Other intervertebral disc degeneration, lumbosacral region: Secondary | ICD-10-CM | POA: Diagnosis not present

## 2012-12-29 DIAGNOSIS — M5137 Other intervertebral disc degeneration, lumbosacral region: Secondary | ICD-10-CM | POA: Diagnosis not present

## 2013-01-13 DIAGNOSIS — M5430 Sciatica, unspecified side: Secondary | ICD-10-CM | POA: Diagnosis not present

## 2013-01-19 DIAGNOSIS — M5430 Sciatica, unspecified side: Secondary | ICD-10-CM | POA: Diagnosis not present

## 2013-02-02 DIAGNOSIS — M545 Low back pain: Secondary | ICD-10-CM | POA: Diagnosis not present

## 2013-02-09 DIAGNOSIS — H26499 Other secondary cataract, unspecified eye: Secondary | ICD-10-CM | POA: Diagnosis not present

## 2013-02-09 DIAGNOSIS — H353190 Nonexudative age-related macular degeneration, unspecified eye, stage unspecified: Secondary | ICD-10-CM | POA: Diagnosis not present

## 2013-02-15 ENCOUNTER — Encounter: Payer: Self-pay | Admitting: Cardiovascular Disease

## 2013-02-15 ENCOUNTER — Ambulatory Visit (INDEPENDENT_AMBULATORY_CARE_PROVIDER_SITE_OTHER): Payer: Medicare Other | Admitting: Cardiovascular Disease

## 2013-02-15 DIAGNOSIS — I2510 Atherosclerotic heart disease of native coronary artery without angina pectoris: Secondary | ICD-10-CM | POA: Diagnosis not present

## 2013-02-15 DIAGNOSIS — I6529 Occlusion and stenosis of unspecified carotid artery: Secondary | ICD-10-CM

## 2013-02-15 NOTE — Progress Notes (Signed)
History of Present Illness: 79 yo WF with history of coronary artery disease, status post two-vessel bypass grafting in December 2004, HTN, HLD, bilateral carotid stenoses, AAA, as well as elevated liver enzymes on Lipitor who is here today for cardiac follow up. She has been followed in the past by Dr. Haroldine Laws. Carotid u/s June 2014 with stable disease, 60-79% bilaterally. Abdominal u/s 11/18/10 showed very small AAA 2.1x 2.1 cm, stable.   Returns for routine follow up today. She is doing well. She denies CP, orthopnea, dyspnea or PND. She has had back pain and had recent epidural injection with resolution of pain. No claudication.   Primary Care Physician: Shanon Ace   Last Lipid Profile:Lipid Panel     Component Value Date/Time   CHOL 99 11/18/2011 0843   TRIG 140.0 11/18/2011 0843   HDL 45.30 11/18/2011 0843   CHOLHDL 2 11/18/2011 0843   VLDL 28.0 11/18/2011 0843   LDLCALC 26 11/18/2011 0843     Past Medical History  Diagnosis Date  . CAD (coronary artery disease)     including LM disease; s/p CABG  . HTN (hypertension)   . Hyperlipidemia   . Bilateral carotid artery stenosis   . OA (osteoarthritis)     severe; s/p bilateral hip replacement  . History of colon cancer     s/p resection in 2004  . AAA (abdominal aortic aneurysm)     small  . History of colonoscopy   . History of chickenpox     Past Surgical History  Procedure Laterality Date  . Partial colectomy  2004    sigmoid resection  . Left hip replacement  2007  . Right hip replacement  2008  . Coronary artery bypass graft  2004  . Refractive surgery  2013    had a film over eye removed.    Current Outpatient Prescriptions  Medication Sig Dispense Refill  . aspirin (ADULT ASPIRIN EC LOW STRENGTH) 81 MG EC tablet Take 81 mg by mouth daily.        Marland Kitchen CALCIUM-MAGNESUIUM-ZINC 333-133-8.3 MG TABS Take by mouth.      . Cholecalciferol (VITAMIN D3) 2000 UNITS TABS Take by mouth.      . clopidogrel (PLAVIX) 75 MG  tablet Take 1 tablet (75 mg total) by mouth daily.  90 tablet  1  . ezetimibe (ZETIA) 10 MG tablet Take 1 tablet (10 mg total) by mouth daily.  90 tablet  3  . metoprolol succinate (TOPROL-XL) 50 MG 24 hr tablet Take 1 tablet (50 mg total) by mouth daily.  90 tablet  3  . Multiple Vitamin (MULITIVITAMIN WITH MINERALS) TABS Take 1 tablet by mouth daily.      . rosuvastatin (CRESTOR) 40 MG tablet Take 1 tablet (40 mg total) by mouth daily.  90 tablet  3  . telmisartan (MICARDIS) 20 MG tablet Take 1 tablet (20 mg total) by mouth daily.  90 tablet  3  . vitamin C (ASCORBIC ACID) 500 MG tablet Take 500 mg by mouth daily.       No current facility-administered medications for this visit.    No Known Allergies  History   Social History  . Marital Status: Widowed    Spouse Name: N/A    Number of Children: 3  . Years of Education: N/A   Occupational History  . 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 Smoking status: Never Smoker   . Smokeless tobacco: Not on  file  . Alcohol Use: No  . Drug Use: No  . Sexual Activity: Not on file   Other Topics Concern  . 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      g3p3  Children are well   Lives independently by herself no pets family daughter  lives close by 8 hours of sleep at least    Negative TAD   14 years of education      Hearing aid and dentures   No falls .  Has smoke detector and wears seat belts. . No excess sun exposure. . No depression             Family History  Problem Relation Age of Onset  . Cancer Mother     intra-abdominal/gallbladder  . Cholelithiasis Mother   . Heart attack Father     42  . Heart disease Father   . Colon cancer Neg Hx   . Diabetes Sister     50de  . Other Brother     GSW 45     Review of Systems:  As stated in the HPI and otherwise negative.     BP 128/60  Pulse 70  Ht 5' 0.25" (1.53 m)  Wt 129 lb 12.8 oz (58.877 kg)  BMI 25.15 kg/m2  Physical Examination: General: Well developed, well nourished, NAD HEENT: OP clear, mucus membranes moist SKIN: warm, dry. No rashes. Neuro: No focal deficits Musculoskeletal: Muscle strength 5/5 all ext Psychiatric: Mood and affect normal Neck: No JVD, faint bilateral carotid bruits, no thyromegaly, no lymphadenopathy. Lungs:Clear bilaterally, no wheezes, rhonci, crackles Cardiovascular: Regular rate and rhythm. No murmurs, gallops or rubs. Abdomen:Soft. Bowel sounds present. Non-tender.  Extremities: No lower extremity edema. Pulses are 1 + in the bilateral DP/PT.  EKG: NSR, rate 70 bpm. Incomplete RBBB. Non-specific ST and T wave abnormality.   Assessment and Plan:   1. CAD: Stable s/p CABG in 2004. She is on good medical therapy. Will continue current meds including ASA/Plavix, Crestor, Toprol, Micardis, Zetia. Lipids have been very well controlled. BP well controlled.   2. CAROTID ARTERY DISEASE: She is known to have bilateral 60-79% stenoses which was stable in June 2014. She has had stable disease for years. She wishes to have her carotids done once yearly so we will arrange for repeat dopplers in June 2015.

## 2013-02-15 NOTE — Patient Instructions (Addendum)
Your physician wants you to follow-up in:  6 months.  You will receive a reminder letter in the mail two months in advance. If you don't receive a letter, please call our office to schedule the follow-up appointment.  Your physician has requested that you have a carotid duplex. This test is an ultrasound of the carotid arteries in your neck. It looks at blood flow through these arteries that supply the brain with blood. Allow one hour for this exam. There are no restrictions or special instructions. To be done in 6 months on day of appointment with Dr. McAlhany   

## 2013-02-16 DIAGNOSIS — M48061 Spinal stenosis, lumbar region without neurogenic claudication: Secondary | ICD-10-CM | POA: Diagnosis not present

## 2013-03-29 DIAGNOSIS — M48061 Spinal stenosis, lumbar region without neurogenic claudication: Secondary | ICD-10-CM | POA: Diagnosis not present

## 2013-03-30 DIAGNOSIS — M545 Low back pain, unspecified: Secondary | ICD-10-CM | POA: Diagnosis not present

## 2013-03-30 DIAGNOSIS — M48061 Spinal stenosis, lumbar region without neurogenic claudication: Secondary | ICD-10-CM | POA: Diagnosis not present

## 2013-04-04 ENCOUNTER — Ambulatory Visit (INDEPENDENT_AMBULATORY_CARE_PROVIDER_SITE_OTHER): Payer: Medicare Other | Admitting: Internal Medicine

## 2013-04-04 ENCOUNTER — Encounter: Payer: Self-pay | Admitting: Internal Medicine

## 2013-04-04 VITALS — BP 146/70 | HR 64 | Temp 97.9°F | Ht 59.5 in | Wt 126.0 lb

## 2013-04-04 DIAGNOSIS — Z23 Encounter for immunization: Secondary | ICD-10-CM | POA: Diagnosis not present

## 2013-04-04 DIAGNOSIS — Z0000 Encounter for general adult medical examination without abnormal findings: Secondary | ICD-10-CM | POA: Diagnosis not present

## 2013-04-04 DIAGNOSIS — M48062 Spinal stenosis, lumbar region with neurogenic claudication: Secondary | ICD-10-CM

## 2013-04-04 DIAGNOSIS — I6529 Occlusion and stenosis of unspecified carotid artery: Secondary | ICD-10-CM

## 2013-04-04 DIAGNOSIS — E785 Hyperlipidemia, unspecified: Secondary | ICD-10-CM

## 2013-04-04 DIAGNOSIS — I2510 Atherosclerotic heart disease of native coronary artery without angina pectoris: Secondary | ICD-10-CM

## 2013-04-04 DIAGNOSIS — I10 Essential (primary) hypertension: Secondary | ICD-10-CM

## 2013-04-04 DIAGNOSIS — I658 Occlusion and stenosis of other precerebral arteries: Secondary | ICD-10-CM

## 2013-04-04 DIAGNOSIS — Z9889 Other specified postprocedural states: Secondary | ICD-10-CM | POA: Diagnosis not present

## 2013-04-04 DIAGNOSIS — Z974 Presence of external hearing-aid: Secondary | ICD-10-CM

## 2013-04-04 DIAGNOSIS — I6523 Occlusion and stenosis of bilateral carotid arteries: Secondary | ICD-10-CM

## 2013-04-04 LAB — CBC WITH DIFFERENTIAL/PLATELET
BASOS ABS: 0 10*3/uL (ref 0.0–0.1)
Basophils Relative: 0.3 % (ref 0.0–3.0)
EOS ABS: 0 10*3/uL (ref 0.0–0.7)
EOS PCT: 0.6 % (ref 0.0–5.0)
HCT: 39 % (ref 36.0–46.0)
Hemoglobin: 13.2 g/dL (ref 12.0–15.0)
LYMPHS ABS: 0.8 10*3/uL (ref 0.7–4.0)
Lymphocytes Relative: 13.2 % (ref 12.0–46.0)
MCHC: 33.8 g/dL (ref 30.0–36.0)
MCV: 85.6 fl (ref 78.0–100.0)
MONO ABS: 0.5 10*3/uL (ref 0.1–1.0)
Monocytes Relative: 7.8 % (ref 3.0–12.0)
NEUTROS PCT: 78.1 % — AB (ref 43.0–77.0)
Neutro Abs: 4.7 10*3/uL (ref 1.4–7.7)
PLATELETS: 227 10*3/uL (ref 150.0–400.0)
RBC: 4.56 Mil/uL (ref 3.87–5.11)
RDW: 14.9 % — ABNORMAL HIGH (ref 11.5–14.6)
WBC: 6 10*3/uL (ref 4.5–10.5)

## 2013-04-04 LAB — LIPID PANEL
Cholesterol: 135 mg/dL (ref 0–200)
HDL: 53.9 mg/dL (ref >39.00)
LDL CALC: 57 mg/dL (ref 0–99)
TRIGLYCERIDES: 119 mg/dL (ref 0.0–149.0)
Total CHOL/HDL Ratio: 3
VLDL: 23.8 mg/dL (ref 0.0–40.0)

## 2013-04-04 LAB — HEPATIC FUNCTION PANEL
ALT: 21 U/L (ref 0–35)
AST: 20 U/L (ref 0–37)
Albumin: 4.1 g/dL (ref 3.5–5.2)
Alkaline Phosphatase: 65 U/L (ref 39–117)
BILIRUBIN DIRECT: 0.1 mg/dL (ref 0.0–0.3)
Total Bilirubin: 1 mg/dL (ref 0.3–1.2)
Total Protein: 6.6 g/dL (ref 6.0–8.3)

## 2013-04-04 LAB — BASIC METABOLIC PANEL
BUN: 27 mg/dL — ABNORMAL HIGH (ref 6–23)
CO2: 25 mEq/L (ref 19–32)
CREATININE: 1.2 mg/dL (ref 0.4–1.2)
Calcium: 9.3 mg/dL (ref 8.4–10.5)
Chloride: 107 mEq/L (ref 96–112)
GFR: 47.73 mL/min — ABNORMAL LOW (ref >60.00)
GLUCOSE: 103 mg/dL — AB (ref 70–99)
Potassium: 4.1 mEq/L (ref 3.5–5.1)
Sodium: 139 mEq/L (ref 135–145)

## 2013-04-04 LAB — TSH: TSH: 2.43 u[IU]/mL (ref 0.35–5.50)

## 2013-04-04 NOTE — Progress Notes (Signed)
Chief Complaint  Patient presents with  . Medicare Wellness    HPI: Patient comes in today for Preventive Medicare wellness visit . No major injuries, ed visits ,hospitalizations , new medications since last visit.  Back per DR   oilin  2 injections.  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 Last eye check UTD  Safety:  Has smoke detector and wears seat belts.  No firearms. No excess sun exposure. Sees dentist regularly.  Falls: no  Advance directive :  Reviewed    Memory: Felt to be good  , no concern from her or her family.  Depression: No anhedonia unusual crying or depressive symptoms  Nutrition: Eats well balanced diet; adequate calcium and vitamin D. No swallowing chewing problems.  Injury: no major injuries in the last six months.  Other healthcare providers:  Reviewed today .  Social:  Lives alone . No pets.  Feels safe   Preventive parameters: up-to-date  Reviewed   ADLS:   There are no problems or need for assistance  driving, feeding, obtaining food, dressing, toileting and bathing, managing money using phone. She is independent.  EXERCISE/ HABITS  Per week  None   No tobacco   no etoh Health Maintenance  Topic Date Due  . Tetanus/tdap  10/26/1947  . Zostavax  10/25/1988  . Influenza Vaccine  10/22/2013  . Colonoscopy  12/01/2016  . Pneumococcal Polysaccharide Vaccine Age 32 And Over  Addressed   Health Maintenance Review    ROS:  GEN/ HEENT: No fever, significant weight changes sweats headaches vision problems hearing changes, CV/ PULM; No chest pain shortness of breath cough, syncope,edema  change in exercise tolerance. GI /GU: No adominal pain, vomiting, change in bowel habits. No blood in the stool. No significant GU symptoms. SKIN/HEME: ,no acute skin rashes suspicious lesions or bleeding. No lymphadenopathy, nodules, masses.  NEURO/ PSYCH:  No NEW  neurologic signs such as weakness numbness. No depression anxiety. IMM/ Allergy: No unusual infections.  Allergy .   REST of 12 system review negative except as per HPI   Past Medical History  Diagnosis Date  . CAD (coronary artery disease)     including LM disease; s/p CABG  . HTN (hypertension)   . Hyperlipidemia   . Bilateral carotid artery stenosis   . OA (osteoarthritis)     severe; s/p bilateral hip replacement  . History of colon cancer     s/p resection in 2004  . AAA (abdominal aortic aneurysm)     small  . History of colonoscopy   . History of chickenpox     Family History  Problem Relation Age of Onset  . Cancer Mother     intra-abdominal/gallbladder  . Cholelithiasis Mother   . Heart attack Father     26  . Heart disease Father   . Colon cancer Neg Hx   . Diabetes Sister     55de  . Other Brother     GSW 67     History   Social History  . Marital Status: Widowed    Spouse Name: N/A    Number of Children: 3  . Years of Education: N/A   Occupational History  . 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 Smoking status: Never Smoker   . Smokeless tobacco: None  . Alcohol Use: No  .  Drug Use: No  . Sexual Activity: None   Other Topics Concern  . 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      g3p3  Children are well   Lives independently by herself no pets family daughter  lives close by 8 hours of sleep at least    Negative TAD   14 years of education      Hearing aid and dentures   No falls .  Has smoke detector and wears seat belts. . No excess sun exposure. . No depression             Outpatient Encounter Prescriptions as of 04/04/2013  Medication Sig  . aspirin (ADULT ASPIRIN EC LOW STRENGTH) 81 MG EC tablet Take 81 mg by mouth daily.    Marland Kitchen CALCIUM-MAGNESUIUM-ZINC 333-133-8.3 MG TABS Take by  mouth.  . clopidogrel (PLAVIX) 75 MG tablet Take 1 tablet (75 mg total) by mouth daily.  . Ergocalciferol (VITAMIN D2) 2000 UNITS TABS Take by mouth.  . ezetimibe (ZETIA) 10 MG tablet Take 1 tablet (10 mg total) by mouth daily.  . metoprolol succinate (TOPROL-XL) 50 MG 24 hr tablet Take 1 tablet (50 mg total) by mouth daily.  . rosuvastatin (CRESTOR) 40 MG tablet Take 1 tablet (40 mg total) by mouth daily.  Marland Kitchen telmisartan (MICARDIS) 20 MG tablet Take 1 tablet (20 mg total) by mouth daily.  . [DISCONTINUED] Cholecalciferol (VITAMIN D3) 2000 UNITS TABS Take by mouth.  . [DISCONTINUED] Multiple Vitamin (MULITIVITAMIN WITH MINERALS) TABS Take 1 tablet by mouth daily.  . [DISCONTINUED] vitamin C (ASCORBIC ACID) 500 MG tablet Take 500 mg by mouth daily.    EXAM:  BP 146/70  Pulse 64  Temp(Src) 97.9 F (36.6 C) (Oral)  Ht 4' 11.5" (1.511 m)  Wt 126 lb (57.153 kg)  BMI 25.03 kg/m2  SpO2 97%  Body mass index is 25.03 kg/(m^2).  Physical Exam: Vital signs reviewed [id number] is a well-developed well-nourished alert cooperative   who appears stated age  Or younger in no acute distress.  HEENT: normocephalic atraumatic , Eyes: PERRL EOM's full, conjunctiva clear, Nares: paten,t no deformity discharge or tenderness., Ears: no deformity EAC's hearing aids  Mouth: clear OP, no lesions, edema.  Moist mucous membranes. Dentures . NECK: supple without masses, thyromegaly  CHEST/PULM:  Clear to auscultation and percussion breath sounds equal no wheeze , rales or rhonchi. No chest wall deformities or tenderness. Well healed scar mid chest  Breast: normal by inspection . No dimpling, discharge, masses, tenderness or discharge . CV: PMI is nondisplaced, S1 S2 no gallops, murmurs, rubs. Peripheral pulses are full without delay.No JVD .  ABDOMEN: Bowel sounds normal nontender  No guard or rebound, no hepato splenomegal no CVA tenderness. \Extremtities:  No clubbing cyanosis or edema, no acute joint swelling or  redness no focal atrophy DJD changes  NEURO:  Oriented x3, cranial nerves 3-12 appear to be intact, no obvious focal weakness,gait within normal limits no abnormal reflexes  SKIN: No acute rashes normal turgor, color, no bruising or petechiae. PSYCH: Oriented, good eye contact, no obvious depression anxiety, cognition and judgment appear normal. LN: no cervical axillary inguinal adenopathy No noted deficits in memory, attention, and speech. Gait steady and agile    Lab Results  Component Value Date   WBC 4.6 04/02/2012   HGB 12.3 04/02/2012   HCT 35.9* 04/02/2012  PLT 197.0 04/02/2012   GLUCOSE 110* 04/02/2012   CHOL 99 11/18/2011   TRIG 140.0 11/18/2011   HDL 45.30 11/18/2011   LDLDIRECT 89.5 08/06/2007   LDLCALC 26 11/18/2011   ALT 20 11/18/2011   AST 22 11/18/2011   NA 139 04/02/2012   K 4.2 04/02/2012   CL 108 04/02/2012   CREATININE 1.3* 04/02/2012   BUN 24* 04/02/2012   CO2 24 04/02/2012   TSH 2.86 04/02/2012    ASSESSMENT AND PLAN:  Discussed the following assessment and plan:  Medicare annual wellness visit, subsequent - declined immuniz all but flu vaccine today  - Plan: Ergocalciferol (VITAMIN D2) 2000 UNITS TABS, Basic metabolic panel, CBC with Differential, Hepatic function panel, TSH, Lipid panel  Hyperlipidemia - Plan: Ergocalciferol (VITAMIN D2) 2000 UNITS TABS, Basic metabolic panel, CBC with Differential, Hepatic function panel, TSH, Lipid panel  HTN (hypertension) - Plan: Ergocalciferol (VITAMIN D2) 2000 UNITS TABS, Basic metabolic panel, CBC with Differential, Hepatic function panel, TSH, Lipid panel  Wears hearing aid - Plan: Ergocalciferol (VITAMIN D2) 2000 UNITS TABS, Basic metabolic panel, CBC with Differential, Hepatic function panel, TSH, Lipid panel  Osteoporosis - Plan: Ergocalciferol (VITAMIN D2) 2000 UNITS TABS, Basic metabolic panel, CBC with Differential, Hepatic function panel, TSH, Lipid panel  CAD (coronary artery disease) - Plan: Ergocalciferol (VITAMIN  D2) 2000 UNITS TABS, Basic metabolic panel, CBC with Differential, Hepatic function panel, TSH, Lipid panel  Need for prophylactic vaccination and inoculation against influenza - Plan: Flu Vaccine QUAD 36+ mos PF IM (Fluarix)  Bilateral carotid artery stenosis Declines zostavax td and prevnar 13 counseled  Can get theses at any time  Flu vaccine tdoay  Counseled regarding healthy nutrition, activity , sleep, injury prevention, calcium vit d and healthy weight .  Patient Care Team: Burnis Medin, MD as PCP - General (Internal Medicine) Mauri Pole, MD (Orthopedic Surgery) Jolaine Artist, MD (Cardiology) Burnell Blanks, MD as Attending Physician (Cardiology) Linton Rump, MD as Attending Physician (Ophthalmology)  Patient Instructions  Continue lifestyle intervention healthy eating and exercise . Will notify you  of labs when available.  Consider getting prevnar 13 for pneumococcal bacterial disease prevention and  any time     Standley Brooking. Makailyn Mccormick M.D.  Below last card check 11 14 dr Angelena Form Assessment and Plan:  1. CAD: Stable s/p CABG in 2004. She is on good medical therapy. Will continue current meds including ASA/Plavix, Crestor, Toprol, Micardis, Zetia. Lipids have been very well controlled. BP well controlled.   2. CAROTID ARTERY DISEASE: She is known to have bilateral 60-79% stenoses which was stable in June 2014. She has had stable disease for years. She wishes to have her carotids done once yearly so we will arrange for repeat dopplers in June 2015.    Pre visit review using our clinic review tool, if applicable. No additional management support is needed unless otherwise documented below in the visit note.

## 2013-04-04 NOTE — Patient Instructions (Signed)
Continue lifestyle intervention healthy eating and exercise . Will notify you  of labs when available.  Consider getting prevnar 13 for pneumococcal bacterial disease prevention and  any time

## 2013-04-26 ENCOUNTER — Telehealth: Payer: Self-pay | Admitting: Internal Medicine

## 2013-04-26 NOTE — Telephone Encounter (Signed)
Relevant patient education mailed to patient.  

## 2013-08-16 ENCOUNTER — Ambulatory Visit (INDEPENDENT_AMBULATORY_CARE_PROVIDER_SITE_OTHER): Payer: Medicare Other | Admitting: Cardiovascular Disease

## 2013-08-16 ENCOUNTER — Encounter: Payer: Self-pay | Admitting: Cardiovascular Disease

## 2013-08-16 ENCOUNTER — Encounter (HOSPITAL_COMMUNITY): Payer: Medicare Other

## 2013-08-16 VITALS — BP 136/78 | HR 80 | Resp 12 | Ht 60.0 in | Wt 133.0 lb

## 2013-08-16 DIAGNOSIS — I6529 Occlusion and stenosis of unspecified carotid artery: Secondary | ICD-10-CM

## 2013-08-16 DIAGNOSIS — E785 Hyperlipidemia, unspecified: Secondary | ICD-10-CM

## 2013-08-16 DIAGNOSIS — I2510 Atherosclerotic heart disease of native coronary artery without angina pectoris: Secondary | ICD-10-CM

## 2013-08-16 DIAGNOSIS — I10 Essential (primary) hypertension: Secondary | ICD-10-CM

## 2013-08-16 MED ORDER — CLOPIDOGREL BISULFATE 75 MG PO TABS: 75.0000 mg | ORAL_TABLET | Freq: Every day | ORAL | Status: DC

## 2013-08-16 MED ORDER — ROSUVASTATIN CALCIUM 40 MG PO TABS: 40.0000 mg | ORAL_TABLET | Freq: Every day | ORAL | Status: DC

## 2013-08-16 MED ORDER — EZETIMIBE 10 MG PO TABS: 10.0000 mg | ORAL_TABLET | Freq: Every day | ORAL | Status: DC

## 2013-08-16 MED ORDER — METOPROLOL SUCCINATE ER 50 MG PO TB24: 50.0000 mg | ORAL_TABLET | Freq: Every day | ORAL | Status: DC

## 2013-08-16 MED ORDER — TELMISARTAN 20 MG PO TABS: 20.0000 mg | ORAL_TABLET | Freq: Every day | ORAL | Status: DC

## 2013-08-16 NOTE — Patient Instructions (Signed)
Your physician wants you to follow-up in:  6 months. You will receive a reminder letter in the mail two months in advance. If you don't receive a letter, please call our office to schedule the follow-up appointment.   

## 2013-08-16 NOTE — Progress Notes (Signed)
History of Present Illness: 80 yo WF with history of coronary artery disease, status post two-vessel bypass grafting in December 2004, HTN, HLD, bilateral carotid stenoses, AAA, as well as elevated liver enzymes on Lipitor who is here today for cardiac follow up. She has been followed in the past by Dr. Haroldine Laws. Carotid u/s June 2014 with stable disease, 60-79% bilaterally. Abdominal u/s 11/18/10 showed very small AAA 2.1x 2.1 cm, stable.   Returns for routine follow up today. She is doing well. She denies CP, orthopnea, dyspnea or PND.   Primary Care Physician: Shanon Ace   Last Lipid Profile:Lipid Panel     Component Value Date/Time   CHOL 135 04/04/2013 1004   TRIG 119.0 04/04/2013 1004   HDL 53.90 04/04/2013 1004   CHOLHDL 3 04/04/2013 1004   VLDL 23.8 04/04/2013 1004   LDLCALC 57 04/04/2013 1004     Past Medical History  Diagnosis Date  . CAD (coronary artery disease)     including LM disease; s/p CABG  . HTN (hypertension)   . Hyperlipidemia   . Bilateral carotid artery stenosis   . OA (osteoarthritis)     severe; s/p bilateral hip replacement  . History of colon cancer     s/p resection in 2004  . AAA (abdominal aortic aneurysm)     small  . History of colonoscopy   . History of chickenpox     Past Surgical History  Procedure Laterality Date  . Partial colectomy  2004    sigmoid resection  . Left hip replacement  2007  . Right hip replacement  2008  . Coronary artery bypass graft  2004  . Refractive surgery  2013    had a film over eye removed.    Current Outpatient Prescriptions  Medication Sig Dispense Refill  . aspirin (ADULT ASPIRIN EC LOW STRENGTH) 81 MG EC tablet Take 81 mg by mouth daily.        Marland Kitchen CALCIUM-MAGNESUIUM-ZINC 333-133-8.3 MG TABS Take by mouth.      . clopidogrel (PLAVIX) 75 MG tablet Take 1 tablet (75 mg total) by mouth daily.  90 tablet  1  . Ergocalciferol (VITAMIN D2) 2000 UNITS TABS Take by mouth.      . ezetimibe (ZETIA) 10 MG  tablet Take 1 tablet (10 mg total) by mouth daily.  90 tablet  3  . metoprolol succinate (TOPROL-XL) 50 MG 24 hr tablet Take 1 tablet (50 mg total) by mouth daily.  90 tablet  3  . rosuvastatin (CRESTOR) 40 MG tablet Take 1 tablet (40 mg total) by mouth daily.  90 tablet  3  . telmisartan (MICARDIS) 20 MG tablet Take 1 tablet (20 mg total) by mouth daily.  90 tablet  3   No current facility-administered medications for this visit.    No Known Allergies  History   Social History  . Marital Status: Widowed    Spouse Name: N/A    Number of Children: 3  . Years of Education: N/A   Occupational History  . 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 Smoking status: Never Smoker   . Smokeless tobacco: Not on file  . Alcohol Use: No  . Drug Use: No  . Sexual Activity: Not on file   Other Topics Concern  . 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      g3p3  Children are well   Lives independently by herself no pets family daughter  lives close by 8 hours of sleep at least    Negative TAD   14 years of education      Hearing aid and dentures   No falls .  Has smoke detector and wears seat belts. . No excess sun exposure. . No depression             Family History  Problem Relation Age of Onset  . Cancer Mother     intra-abdominal/gallbladder  . Cholelithiasis Mother   . Heart attack Father     19  . Heart disease Father   . Colon cancer Neg Hx   . Diabetes Sister     76de  . Other Brother     GSW 45     Review of Systems:  As stated in the HPI and otherwise negative.   BP 136/78  Pulse 80  Resp 12  Ht 5' (1.524 m)  Wt 133 lb (60.328 kg)  BMI 25.97 kg/m2  Physical Examination: General: Well developed, well nourished, NAD HEENT: OP clear, mucus membranes moist SKIN: warm, dry. No rashes. Neuro: No focal  deficits Musculoskeletal: Muscle strength 5/5 all ext Psychiatric: Mood and affect normal Neck: No JVD, faint bilateral carotid bruits, no thyromegaly, no lymphadenopathy. Lungs:Clear bilaterally, no wheezes, rhonci, crackles Cardiovascular: Regular rate and rhythm. No murmurs, gallops or rubs. Abdomen:Soft. Bowel sounds present. Non-tender.  Extremities: No lower extremity edema. Pulses are 1 + in the bilateral DP/PT.  Assessment and Plan:   1. CAD: Stable s/p CABG in 2004. She is on good medical therapy. Will continue current meds including ASA/Plavix, Crestor, Toprol, Micardis, Zetia.   2. CAROTID ARTERY DISEASE: She is known to have bilateral 60-79% stenoses which was stable in June 2014. She has had stable disease for years. She wishes to have her carotids done once yearly so we will arrange for repeat dopplers in June 2015.    3. HTN: BP well controlled. No changes.   4. HLD: Lipids have been very well controlled.  Continue statin.

## 2013-08-17 ENCOUNTER — Encounter (HOSPITAL_COMMUNITY): Payer: Medicare Other

## 2013-08-22 ENCOUNTER — Ambulatory Visit (HOSPITAL_COMMUNITY): Payer: Medicare Other | Attending: Cardiovascular Disease | Admitting: Cardiology

## 2013-08-22 DIAGNOSIS — I6529 Occlusion and stenosis of unspecified carotid artery: Secondary | ICD-10-CM | POA: Diagnosis not present

## 2013-08-22 DIAGNOSIS — I63239 Cerebral infarction due to unspecified occlusion or stenosis of unspecified carotid arteries: Secondary | ICD-10-CM | POA: Diagnosis not present

## 2013-08-22 NOTE — Progress Notes (Signed)
Carotid duplex complete 

## 2014-03-21 ENCOUNTER — Encounter: Payer: Self-pay | Admitting: Cardiovascular Disease

## 2014-03-21 ENCOUNTER — Ambulatory Visit (INDEPENDENT_AMBULATORY_CARE_PROVIDER_SITE_OTHER): Payer: Medicare Other | Admitting: Cardiovascular Disease

## 2014-03-21 VITALS — BP 120/70 | HR 68 | Ht 60.0 in | Wt 136.0 lb

## 2014-03-21 DIAGNOSIS — I2510 Atherosclerotic heart disease of native coronary artery without angina pectoris: Secondary | ICD-10-CM | POA: Diagnosis not present

## 2014-03-21 DIAGNOSIS — I779 Disorder of arteries and arterioles, unspecified: Secondary | ICD-10-CM | POA: Diagnosis not present

## 2014-03-21 DIAGNOSIS — I25110 Atherosclerotic heart disease of native coronary artery with unstable angina pectoris: Secondary | ICD-10-CM

## 2014-03-21 DIAGNOSIS — I739 Peripheral vascular disease, unspecified: Secondary | ICD-10-CM

## 2014-03-21 DIAGNOSIS — E785 Hyperlipidemia, unspecified: Secondary | ICD-10-CM

## 2014-03-21 DIAGNOSIS — I10 Essential (primary) hypertension: Secondary | ICD-10-CM | POA: Diagnosis not present

## 2014-03-21 NOTE — Patient Instructions (Addendum)
Your physician wants you to follow-up in: 6 months.  You will receive a reminder letter in the mail two months in advance. If you don't receive a letter, please call our office to schedule the follow-up appointment.  Your physician has requested that you have a lexiscan myoview. For further information please visit [email or web address]. Please follow instruction sheet, as given.   Your physician has requested that you have a carotid duplex. This test is an ultrasound of the carotid arteries in your neck. It looks at blood flow through these arteries that supply the brain with blood. Allow one hour for this exam. There are no restrictions or special instructions. To be done in early June 2016 (after June 2)

## 2014-03-21 NOTE — Progress Notes (Signed)
History of Present Illness: 80 yo WF with history of coronary artery disease s/p 2V CABG in December 2004, HTN, HLD, bilateral carotid artery stenosis, AAA, as well as elevated liver enzymes on Lipitor who is here today for cardiac follow up. She has been followed in the past by Dr. Haroldine Laws. Carotid u/s June 2015 with stable disease, 60-79% bilaterally. Abdominal u/s 11/18/10 showed very small AAA 2.1x 2.1 cm, stable.   Returns for routine follow up today. She is doing well overall but she does describe pressure in the center, no radiation. Associated SOB. Resolves with rest. No dizziness, near syncope or syncope.    Primary Care Physician: Shanon Ace   Last Lipid Profile:Lipid Panel     Component Value Date/Time   CHOL 135 04/04/2013 1004   TRIG 119.0 04/04/2013 1004   HDL 53.90 04/04/2013 1004   CHOLHDL 3 04/04/2013 1004   VLDL 23.8 04/04/2013 1004   LDLCALC 57 04/04/2013 1004     Past Medical History  Diagnosis Date  . CAD (coronary artery disease)     including LM disease; s/p CABG  . HTN (hypertension)   . Hyperlipidemia   . Bilateral carotid artery stenosis   . OA (osteoarthritis)     severe; s/p bilateral hip replacement  . History of colon cancer     s/p resection in 2004  . AAA (abdominal aortic aneurysm)     small  . History of colonoscopy   . History of chickenpox     Past Surgical History  Procedure Laterality Date  . Partial colectomy  2004    sigmoid resection  . Left hip replacement  2007  . Right hip replacement  2008  . Coronary artery bypass graft  2004  . Refractive surgery  2013    had a film over eye removed.    Current Outpatient Prescriptions  Medication Sig Dispense Refill  . aspirin (ADULT ASPIRIN EC LOW STRENGTH) 81 MG EC tablet Take 81 mg by mouth daily.      Marland Kitchen CALCIUM-MAGNESUIUM-ZINC 333-133-8.3 MG TABS Take by mouth.    . clopidogrel (PLAVIX) 75 MG tablet Take 1 tablet (75 mg total) by mouth daily. 90 tablet 3  . Ergocalciferol  (VITAMIN D2) 2000 UNITS TABS Take by mouth.    . ezetimibe (ZETIA) 10 MG tablet Take 1 tablet (10 mg total) by mouth daily. 90 tablet 3  . metoprolol succinate (TOPROL-XL) 50 MG 24 hr tablet Take 1 tablet (50 mg total) by mouth daily. 90 tablet 3  . rosuvastatin (CRESTOR) 40 MG tablet Take 1 tablet (40 mg total) by mouth daily. 90 tablet 3  . telmisartan (MICARDIS) 20 MG tablet Take 1 tablet (20 mg total) by mouth daily. 90 tablet 3   No current facility-administered medications for this visit.    No Known Allergies  History   Social History  . Marital Status: Widowed    Spouse Name: N/A    Number of Children: 3  . Years of Education: N/A   Occupational History  . 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 Smoking status: Never Smoker   . Smokeless tobacco: Not on file  . Alcohol Use: No  . Drug Use: No  . Sexual Activity: Not on file   Other Topics Concern  . 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      g3p3  Children are well   Lives independently by herself no pets family daughter  lives close by 8 hours of sleep at least    Negative TAD   14 years of education      Hearing aid and dentures   No falls .  Has smoke detector and wears seat belts. . No excess sun exposure. . No depression             Family History  Problem Relation Age of Onset  . Cancer Mother     intra-abdominal/gallbladder  . Cholelithiasis Mother   . Heart attack Father     23  . Heart disease Father   . Colon cancer Neg Hx   . Diabetes Sister     11de  . Other Brother     GSW 45     Review of Systems:  As stated in the HPI and otherwise negative.   BP 120/70 mmHg  Pulse 68  Ht 5' (1.524 m)  Wt 136 lb (61.689 kg)  BMI 26.56 kg/m2  SpO2 98%  Physical Examination: General: Well developed, well nourished, NAD HEENT: OP clear,  mucus membranes moist SKIN: warm, dry. No rashes. Neuro: No focal deficits Musculoskeletal: Muscle strength 5/5 all ext Psychiatric: Mood and affect normal Neck: No JVD, faint bilateral carotid bruits, no thyromegaly, no lymphadenopathy. Lungs:Clear bilaterally, no wheezes, rhonci, crackles Cardiovascular: Regular rate and rhythm. No murmurs, gallops or rubs. Abdomen:Soft. Bowel sounds present. Non-tender.  Extremities: No lower extremity edema. Pulses are 1 + in the bilateral DP/PT.  EKG: NSR, rate 68 bpm. Incomplete RBBB. Non-specific ST and T wave abn. QTc484 msec.   Assessment and Plan:   1. CAD/Chest pain: Cannot exclude unstable angina. s/p CABG in 2004. Will arrange Lexiscan stress myoview to exclude ischemia. She is on good medical therapy. Will continue current meds including ASA/Plavix, Crestor, Toprol, Micardis, Zetia.   2. CAROTID ARTERY DISEASE: She is known to have bilateral 60-79% stenoses which was stable in June 2015. She has had stable disease for years. She wishes to have her carotids done once yearly so we will arrange for repeat dopplers in June 2016.    3. HTN: BP well controlled. No changes.   4. HLD: Lipids have been very well controlled.  Continue statin.

## 2014-03-31 ENCOUNTER — Ambulatory Visit (HOSPITAL_COMMUNITY): Payer: Medicare Other | Attending: Cardiovascular Disease | Admitting: Radiology

## 2014-03-31 DIAGNOSIS — I4510 Unspecified right bundle-branch block: Secondary | ICD-10-CM | POA: Diagnosis not present

## 2014-03-31 DIAGNOSIS — R079 Chest pain, unspecified: Secondary | ICD-10-CM | POA: Diagnosis not present

## 2014-03-31 DIAGNOSIS — I25110 Atherosclerotic heart disease of native coronary artery with unstable angina pectoris: Secondary | ICD-10-CM | POA: Diagnosis not present

## 2014-03-31 DIAGNOSIS — R0789 Other chest pain: Secondary | ICD-10-CM | POA: Diagnosis not present

## 2014-03-31 DIAGNOSIS — R9431 Abnormal electrocardiogram [ECG] [EKG]: Secondary | ICD-10-CM | POA: Insufficient documentation

## 2014-03-31 MED ORDER — TECHNETIUM TC 99M SESTAMIBI GENERIC - CARDIOLITE
10.0000 | Freq: Once | INTRAVENOUS | Status: AC | PRN
  Administered 2014-03-31: 10 via INTRAVENOUS

## 2014-03-31 MED ORDER — TECHNETIUM TC 99M SESTAMIBI GENERIC - CARDIOLITE
30.0000 | Freq: Once | INTRAVENOUS | Status: AC | PRN
  Administered 2014-03-31: 30 via INTRAVENOUS

## 2014-03-31 MED ORDER — REGADENOSON 0.4 MG/5ML IV SOLN
0.4000 mg | Freq: Once | INTRAVENOUS | Status: AC
  Administered 2014-03-31: 0.4 mg via INTRAVENOUS

## 2014-03-31 NOTE — Progress Notes (Signed)
[facility] [address] [place], [place] [address] [phone]    Cardiology Nuclear Med Study  Audrey [name] is a 81 y.o. female     MRN : [id number]     DOB: [birth year]  Procedure Date: 03/31/2014  Nuclear Med Background Indication for Stress Test:  Evaluation for Ischemia and Abnormal EKG History:  CAD, CABG x 2 in 2004 Cardiac Risk Factors: Carotid Disease, RBBB and abnormal EKG  Symptoms:  Chest Pain with Exertion (last date of chest discomfort couple weeks ago) and SOB   Nuclear Pre-Procedure Caffeine/Decaff Intake:  None NPO After: 7:00pm   Lungs:  clear O2 Sat: 95% on room air. IV 0.9% NS with Angio Cath:  22g  IV Site: R Antecubital  IV Started by:  Earl Many, CNMT  Chest Size (in):  38 Cup Size: D  Height:   60" Weight:  133 lb (60.328 kg)  BMI:  Body mass index is 25.97 kg/(m^2). Tech Comments:  NA    Nuclear Med Study 1 or 2 day study: 1 day  Stress Test Type:  Carlton Adam  Reading MD: Wells Guiles, MD  Order Authorizing Provider:  C. McAlhany, MD  Resting Radionuclide: Technetium 60m Sestamibi  Resting Radionuclide Dose: 11.0 mCi   Stress Radionuclide:  Technetium 43m Sestamibi  Stress Radionuclide Dose: 33.0 mCi           Stress Protocol Rest HR: 73 Stress HR: 100  Rest BP: 194/76 Stress BP: 110/46  Exercise Time (min): n/a METS: n/a   Predicted Max HR: 135 bpm % Max HR: 74.07 bpm Rate Pressure Product: 14100   Dose of Adenosine (mg):  n/a Dose of Lexiscan: 0.4 mg  Dose of Atropine (mg): n/a Dose of Dobutamine: n/a mcg/kg/min (at max HR)  Stress Test Technologist: Glade Lloyd, BS-ES  Nuclear Technologist:  Earl Many, CNMT     Rest Procedure:  Myocardial perfusion imaging was performed at rest 45 minutes following the intravenous administration of Technetium 52m Sestamibi. Rest ECG: NSR-RBBB  Stress Procedure:  The patient received IV Lexiscan 0.4 mg over 15-seconds.  Technetium 3m Sestamibi injected at  30-seconds.  Quantitative spect images were obtained after a 45 minute delay.  During the infusion of Lexiscan the patient complained of SOB, cough, arm tightness and wooziness.  These symptoms began to resolve in recovery.  Stress ECG: No significant change from baseline ECG  QPS Raw Data Images:  Mild breast attenuation.  Normal left ventricular size. Stress Images:  There is decreased uptake in the apex. Rest Images:  There is decreased uptake in the apex. Subtraction (SDS):  No significant reversibility Transient Ischemic Dilatation (Normal <1.22):  0.95 Lung/Heart Ratio (Normal <0.45):  0.43  Quantitative Gated Spect Images QGS EDV:  37 ml QGS ESV:  4 ml  Impression Exercise Capacity:  Lexiscan with no exercise. BP Response:  Normal blood pressure response. Clinical Symptoms:  No significant symptoms noted. ECG Impression:  No significant ECG changes with Lexiscan. Comparison with Prior Nuclear Study: No previous nuclear study performed  Overall Impression:  Low risk stress nuclear study with small, mild intensity mostly fixed apical defect, which is likely breast attenuation artifact. No significant reversible ischemia.  LV Ejection Fraction: 90%.  LV Wall Motion:  Normal Wall Motion   Pixie Casino, MD, [facility] Board Certified in Nuclear Cardiology Attending Cardiologist [facility]    .

## 2014-04-04 ENCOUNTER — Telehealth: Payer: Self-pay | Admitting: Cardiovascular Disease

## 2014-04-04 NOTE — Telephone Encounter (Signed)
Left message to call back  

## 2014-04-04 NOTE — Telephone Encounter (Signed)
New Msg         Pt returning call about stress test, please call back.

## 2014-04-04 NOTE — Telephone Encounter (Signed)
Spoke with pt and reviewed stress test results with her.  

## 2014-06-21 ENCOUNTER — Ambulatory Visit (INDEPENDENT_AMBULATORY_CARE_PROVIDER_SITE_OTHER): Payer: Medicare Other | Admitting: Internal Medicine

## 2014-06-21 ENCOUNTER — Encounter: Payer: Self-pay | Admitting: Internal Medicine

## 2014-06-21 VITALS — BP 162/60 | Temp 97.9°F | Ht 59.0 in | Wt 133.5 lb

## 2014-06-21 DIAGNOSIS — I2510 Atherosclerotic heart disease of native coronary artery without angina pectoris: Secondary | ICD-10-CM

## 2014-06-21 DIAGNOSIS — Z974 Presence of external hearing-aid: Secondary | ICD-10-CM | POA: Diagnosis not present

## 2014-06-21 DIAGNOSIS — IMO0001 Reserved for inherently not codable concepts without codable children: Secondary | ICD-10-CM

## 2014-06-21 DIAGNOSIS — Z23 Encounter for immunization: Secondary | ICD-10-CM

## 2014-06-21 DIAGNOSIS — Z0000 Encounter for general adult medical examination without abnormal findings: Secondary | ICD-10-CM | POA: Diagnosis not present

## 2014-06-21 DIAGNOSIS — I10 Essential (primary) hypertension: Secondary | ICD-10-CM

## 2014-06-21 DIAGNOSIS — R54 Age-related physical debility: Secondary | ICD-10-CM

## 2014-06-21 DIAGNOSIS — E785 Hyperlipidemia, unspecified: Secondary | ICD-10-CM

## 2014-06-21 LAB — CBC WITH DIFFERENTIAL/PLATELET
BASOS ABS: 0 10*3/uL (ref 0.0–0.1)
BASOS PCT: 0.3 % (ref 0.0–3.0)
Eosinophils Absolute: 0 10*3/uL (ref 0.0–0.7)
Eosinophils Relative: 1 % (ref 0.0–5.0)
HCT: 36.8 % (ref 36.0–46.0)
Hemoglobin: 12.8 g/dL (ref 12.0–15.0)
LYMPHS ABS: 1 10*3/uL (ref 0.7–4.0)
LYMPHS PCT: 22 % (ref 12.0–46.0)
MCHC: 34.8 g/dL (ref 30.0–36.0)
MCV: 84.1 fl (ref 78.0–100.0)
MONO ABS: 0.4 10*3/uL (ref 0.1–1.0)
Monocytes Relative: 9.1 % (ref 3.0–12.0)
Neutro Abs: 3.1 10*3/uL (ref 1.4–7.7)
Neutrophils Relative %: 67.6 % (ref 43.0–77.0)
Platelets: 190 10*3/uL (ref 150.0–400.0)
RBC: 4.38 Mil/uL (ref 3.87–5.11)
RDW: 16.9 % — AB (ref 11.5–15.5)
WBC: 4.5 10*3/uL (ref 4.0–10.5)

## 2014-06-21 LAB — LIPID PANEL
Cholesterol: 116 mg/dL (ref 0–200)
HDL: 52.2 mg/dL (ref >39.00)
LDL Cholesterol: 30 mg/dL (ref 0–99)
NonHDL: 63.8
TRIGLYCERIDES: 167 mg/dL — AB (ref 0.0–149.0)
Total CHOL/HDL Ratio: 2
VLDL: 33.4 mg/dL (ref 0.0–40.0)

## 2014-06-21 LAB — HEPATIC FUNCTION PANEL
ALK PHOS: 86 U/L (ref 39–117)
ALT: 33 U/L (ref 0–35)
AST: 29 U/L (ref 0–37)
Albumin: 4.1 g/dL (ref 3.5–5.2)
BILIRUBIN TOTAL: 1.3 mg/dL — AB (ref 0.2–1.2)
Bilirubin, Direct: 0.3 mg/dL (ref 0.0–0.3)
TOTAL PROTEIN: 6.7 g/dL (ref 6.0–8.3)

## 2014-06-21 LAB — BASIC METABOLIC PANEL
BUN: 25 mg/dL — ABNORMAL HIGH (ref 6–23)
CO2: 29 mEq/L (ref 19–32)
Calcium: 9.8 mg/dL (ref 8.4–10.5)
Chloride: 106 mEq/L (ref 96–112)
Creatinine, Ser: 1.32 mg/dL — ABNORMAL HIGH (ref 0.40–1.20)
GFR: 40.59 mL/min — AB (ref >60.00)
Glucose, Bld: 109 mg/dL — ABNORMAL HIGH (ref 70–99)
Potassium: 4.7 mEq/L (ref 3.5–5.1)
SODIUM: 139 meq/L (ref 135–145)

## 2014-06-21 LAB — TSH: TSH: 3.21 u[IU]/mL (ref 0.35–4.50)

## 2014-06-21 NOTE — Patient Instructions (Addendum)
Yearly flu vaccine   Next year  Continue lifestyle intervention healthy eating and exercise . Medication  Will notify you  of labs when available. Take blood pressure readings twice a day for 10 days -14 and then periodically .To ensure below 140-145 /90   .   Fall Prevention and Home Safety Falls cause injuries and can affect all age groups. It is possible to use preventive measures to significantly decrease the likelihood of falls. There are many simple measures which can make your home safer and prevent falls. OUTDOORS  Repair cracks and edges of walkways and driveways.  Remove high doorway thresholds.  Trim shrubbery on the main path into your home.  Have good outside lighting.  Clear walkways of tools, rocks, debris, and clutter.  Check that handrails are not broken and are securely fastened. Both sides of steps should have handrails.  Have leaves, snow, and ice cleared regularly.  Use sand or salt on walkways during winter months.  In the garage, clean up grease or oil spills. BATHROOM  Install night lights.  Install grab bars by the toilet and in the tub and shower.  Use non-skid mats or decals in the tub or shower.  Place a plastic non-slip stool in the shower to sit on, if needed.  Keep floors dry and clean up all water on the floor immediately.  Remove soap buildup in the tub or shower on a regular basis.  Secure bath mats with non-slip, double-sided rug tape.  Remove throw rugs and tripping hazards from the floors. BEDROOMS  Install night lights.  Make sure a bedside light is easy to reach.  Do not use oversized bedding.  Keep a telephone by your bedside.  Have a firm chair with side arms to use for getting dressed.  Remove throw rugs and tripping hazards from the floor. KITCHEN  Keep handles on pots and pans turned toward the center of the stove. Use back burners when possible.  Clean up spills quickly and allow time for drying.  Avoid  walking on wet floors.  Avoid hot utensils and knives.  Position shelves so they are not too high or low.  Place commonly used objects within easy reach.  If necessary, use a sturdy step stool with a grab bar when reaching.  Keep electrical cables out of the way.  Do not use floor polish or wax that makes floors slippery. If you must use wax, use non-skid floor wax.  Remove throw rugs and tripping hazards from the floor. STAIRWAYS  Never leave objects on stairs.  Place handrails on both sides of stairways and use them. Fix any loose handrails. Make sure handrails on both sides of the stairways are as long as the stairs.  Check carpeting to make sure it is firmly attached along stairs. Make repairs to worn or loose carpet promptly.  Avoid placing throw rugs at the top or bottom of stairways, or properly secure the rug with carpet tape to prevent slippage. Get rid of throw rugs, if possible.  Have an electrician put in a light switch at the top and bottom of the stairs. OTHER FALL PREVENTION TIPS  Wear low-heel or rubber-soled shoes that are supportive and fit well. Wear closed toe shoes.  When using a stepladder, make sure it is fully opened and both spreaders are firmly locked. Do not climb a closed stepladder.  Add color or contrast paint or tape to grab bars and handrails in your home. Place contrasting color strips on  first and last steps.  Learn and use mobility aids as needed. Install an electrical emergency response system.  Turn on lights to avoid dark areas. Replace light bulbs that burn out immediately. Get light switches that glow.  Arrange furniture to create clear pathways. Keep furniture in the same place.  Firmly attach carpet with non-skid or double-sided tape.  Eliminate uneven floor surfaces.  Select a carpet pattern that does not visually hide the edge of steps.  Be aware of all pets. OTHER HOME SAFETY TIPS  Set the water temperature for 120 F  (48.8 C).  Keep emergency numbers on or near the telephone.  Keep smoke detectors on every level of the home and near sleeping areas. Document Released: 02/28/2002 Document Revised: 09/09/2011 Document Reviewed: 05/30/2011 [facility] Patient Information 2015 [place], [place]. This information is not intended to replace advice given to you by your health care provider. Make sure you discuss any questions you have with your health care provider.
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
## 2014-08-08 ENCOUNTER — Telehealth: Payer: Self-pay | Admitting: Cardiovascular Disease

## 2014-08-08 NOTE — Telephone Encounter (Signed)
New message      Pt need a hard copy of presc for all of her medications to send to the [place].  Pt prefers to be called tomorrow morning.

## 2014-08-09 MED ORDER — CLOPIDOGREL BISULFATE 75 MG PO TABS: 75.0000 mg | ORAL_TABLET | Freq: Every day | ORAL | Status: DC

## 2014-08-09 MED ORDER — METOPROLOL SUCCINATE ER 50 MG PO TB24: 50.0000 mg | ORAL_TABLET | Freq: Every day | ORAL | Status: DC

## 2014-08-09 MED ORDER — TELMISARTAN 20 MG PO TABS: 20.0000 mg | ORAL_TABLET | Freq: Every day | ORAL | Status: DC

## 2014-08-09 MED ORDER — EZETIMIBE 10 MG PO TABS: 10.0000 mg | ORAL_TABLET | Freq: Every day | ORAL | Status: DC

## 2014-08-09 MED ORDER — ROSUVASTATIN CALCIUM 40 MG PO TABS: 40.0000 mg | ORAL_TABLET | Freq: Every day | ORAL | Status: DC

## 2014-08-09 NOTE — Telephone Encounter (Signed)
Spoke with pt and confirmed her address. Will mail prescriptions to her.

## 2014-08-30 ENCOUNTER — Ambulatory Visit (HOSPITAL_COMMUNITY): Payer: Medicare Other | Attending: Cardiovascular Disease

## 2014-08-30 DIAGNOSIS — I6523 Occlusion and stenosis of bilateral carotid arteries: Secondary | ICD-10-CM | POA: Insufficient documentation

## 2014-08-30 DIAGNOSIS — I779 Disorder of arteries and arterioles, unspecified: Secondary | ICD-10-CM

## 2014-08-30 DIAGNOSIS — I739 Peripheral vascular disease, unspecified: Secondary | ICD-10-CM

## 2014-09-08 ENCOUNTER — Encounter: Payer: Medicare Other | Admitting: Internal Medicine

## 2014-09-20 ENCOUNTER — Ambulatory Visit (INDEPENDENT_AMBULATORY_CARE_PROVIDER_SITE_OTHER): Payer: Medicare Other | Admitting: Cardiovascular Disease

## 2014-09-20 ENCOUNTER — Encounter: Payer: Self-pay | Admitting: Cardiovascular Disease

## 2014-09-20 VITALS — BP 158/58 | HR 68 | Ht 61.0 in | Wt 131.0 lb

## 2014-09-20 DIAGNOSIS — I2510 Atherosclerotic heart disease of native coronary artery without angina pectoris: Secondary | ICD-10-CM

## 2014-09-20 DIAGNOSIS — I779 Disorder of arteries and arterioles, unspecified: Secondary | ICD-10-CM

## 2014-09-20 DIAGNOSIS — I739 Peripheral vascular disease, unspecified: Secondary | ICD-10-CM

## 2014-09-20 DIAGNOSIS — I10 Essential (primary) hypertension: Secondary | ICD-10-CM

## 2014-09-20 DIAGNOSIS — E785 Hyperlipidemia, unspecified: Secondary | ICD-10-CM

## 2014-09-20 NOTE — Patient Instructions (Signed)
Medication Instructions:  Your physician recommends that you continue on your current medications as directed. Please refer to the Current Medication list given to you today.   Labwork: none  Testing/Procedures: none  Follow-Up: Your physician wants you to follow-up in: 6 months.  You will receive a reminder letter in the mail two months in advance. If you don't receive a letter, please call our office to schedule the follow-up appointment.       

## 2014-09-20 NOTE — Progress Notes (Signed)
Chief Complaint  Patient presents with  . Dizziness    History of Present Illness: 81 yo WF with history of coronary artery disease s/p 2V CABG in December 2004, HTN, HLD, bilateral carotid artery stenosis, AAA, as well as elevated liver enzymes on Lipitor who is here today for cardiac follow up. She has been followed in the past by Dr. Haroldine Laws. Carotid u/s June 2016 with stable disease, 60-79% bilaterally. Abdominal u/s 11/18/10 showed very small AAA 2.1x 2.1 cm, stable. Stress test January 2016 with no ischemia.   Returns for routine follow up today. She is doing well. She has had no chest pain or SOB. She has had two episodes of dizziness. No near syncope or syncope.  No fever, chills, diarrhea.   Primary Care Physician: Shanon Ace   Last Lipid Profile:Lipid Panel     Component Value Date/Time   CHOL 116 06/21/2014 1141   TRIG 167.0* 06/21/2014 1141   HDL 52.20 06/21/2014 1141   CHOLHDL 2 06/21/2014 1141   VLDL 33.4 06/21/2014 1141   LDLCALC 30 06/21/2014 1141     Past Medical History  Diagnosis Date  . CAD (coronary artery disease)     including LM disease; s/p CABG  . HTN (hypertension)   . Hyperlipidemia   . Bilateral carotid artery stenosis   . OA (osteoarthritis)     severe; s/p bilateral hip replacement  . History of colon cancer     s/p resection in 2004  . AAA (abdominal aortic aneurysm)     small  . History of colonoscopy   . History of chickenpox     Past Surgical History  Procedure Laterality Date  . Partial colectomy  2004    sigmoid resection  . Left hip replacement  2007  . Right hip replacement  2008  . Coronary artery bypass graft  2004  . Refractive surgery  2013    had a film over eye removed.    Current Outpatient Prescriptions  Medication Sig Dispense Refill  . Ascorbic Acid (VITAMIN C) 1000 MG tablet Take 1,000 mg by mouth daily.    Marland Kitchen aspirin (ADULT ASPIRIN EC LOW STRENGTH) 81 MG EC tablet Take 81 mg by mouth daily.      .  clopidogrel (PLAVIX) 75 MG tablet Take 1 tablet (75 mg total) by mouth daily. 90 tablet 3  . ezetimibe (ZETIA) 10 MG tablet Take 1 tablet (10 mg total) by mouth daily. 90 tablet 3  . metoprolol succinate (TOPROL-XL) 50 MG 24 hr tablet Take 1 tablet (50 mg total) by mouth daily. 90 tablet 3  . Multiple Vitamins-Minerals (ALIVE WOMENS 50+ PO) Take 1 tablet by mouth daily.    . rosuvastatin (CRESTOR) 40 MG tablet Take 1 tablet (40 mg total) by mouth daily. 90 tablet 3  . telmisartan (MICARDIS) 20 MG tablet Take 1 tablet (20 mg total) by mouth daily. 90 tablet 3  . TURMERIC PO Take by mouth.     No current facility-administered medications for this visit.    No Known Allergies  History   Social History  . Marital Status: Widowed    Spouse Name: N/A  . Number of Children: 3  . Years of Education: N/A   Occupational History  . 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 Smoking status: Never Smoker   . Smokeless tobacco: Not on file  . Alcohol Use: No  . Drug Use: No  . Sexual Activity: Not on  file   Other Topics Concern  . 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      g3p3  Children are well   Lives independently by herself no pets family daughter  lives close by 8 hours of sleep at least    Negative TAD   14 years of education      Hearing aid and dentures   No falls .  Has smoke detector and wears seat belts. . No excess sun exposure. . No depression             Family History  Problem Relation Age of Onset  . Cancer Mother     intra-abdominal/gallbladder  . Cholelithiasis Mother   . Heart attack Father     52  . Heart disease Father   . Colon cancer Neg Hx   . Diabetes Sister     10de  . Other Brother     GSW 45     Review of Systems:  As stated in the HPI and otherwise negative.   BP 158/58 mmHg  Pulse 68   Ht 5\' 1"  (1.549 m)  Wt 131 lb (59.421 kg)  BMI 24.76 kg/m2  Physical Examination: General: Well developed, well nourished, NAD HEENT: OP clear, mucus membranes moist SKIN: warm, dry. No rashes. Neuro: No focal deficits Musculoskeletal: Muscle strength 5/5 all ext Psychiatric: Mood and affect normal Neck: No JVD, faint bilateral carotid bruits, no thyromegaly, no lymphadenopathy. Lungs:Clear bilaterally, no wheezes, rhonci, crackles Cardiovascular: Regular rate and rhythm. No murmurs, gallops or rubs. Abdomen:Soft. Bowel sounds present. Non-tender.  Extremities: No lower extremity edema. Pulses are 1 + in the bilateral DP/PT  EKG:  EKG is not ordered today. The ekg ordered today demonstrates   Recent Labs: 06/21/2014: ALT 33; BUN 25*; Creatinine, Ser 1.32*; Hemoglobin 12.8; Platelets 190.0; Potassium 4.7; Sodium 139; TSH 3.21   Lipid Panel    Component Value Date/Time   CHOL 116 06/21/2014 1141   TRIG 167.0* 06/21/2014 1141   HDL 52.20 06/21/2014 1141   CHOLHDL 2 06/21/2014 1141   VLDL 33.4 06/21/2014 1141   LDLCALC 30 06/21/2014 1141   LDLDIRECT 89.5 08/06/2007 0951     Wt Readings from Last 3 Encounters:  09/20/14 131 lb (59.421 kg)  06/21/14 133 lb 8 oz (60.555 kg)  03/31/14 133 lb (60.328 kg)     Other studies Reviewed: Additional studies/ records that were reviewed today include: . Review of the above records demonstrates:     Assessment and Plan:   1. CAD: s/p CABG in 2004. Lexiscan stress myoview January 2016 with no ischemia. She is on good medical therapy. Will continue current meds including ASA/Plavix, Crestor, Toprol, Micardis, Zetia.   2. CAROTID ARTERY DISEASE: She is known to have bilateral 60-79% stenoses which was stable in June 2016. She has had stable disease for years. She wishes to have her carotids done once yearly so we will arrange for repeat dopplers in June 2017  3. HTN: BP well controlled at home but higher last few checks. Will follow at  home.   4. HLD: Lipids have been very well controlled.  Continue statin.   Current medicines are reviewed at length with the patient today.  The patient does not have concerns regarding medicines.  The following changes have been made:  no change  Labs/ tests ordered today include:  No orders of the defined types were placed in this encounter.    Disposition:   FU with me in 6 months  Signed, Lauree Chandler, MD 09/20/2014 3:44 PM    [facility] [address], [place], [place]  [address] Phone: [phone]; Fax: [phone]

## 2015-04-30 ENCOUNTER — Encounter: Payer: Self-pay | Admitting: Cardiovascular Disease

## 2015-04-30 ENCOUNTER — Ambulatory Visit (INDEPENDENT_AMBULATORY_CARE_PROVIDER_SITE_OTHER): Payer: Medicare Other | Admitting: Cardiovascular Disease

## 2015-04-30 VITALS — BP 102/66 | HR 66 | Ht 61.0 in | Wt 131.0 lb

## 2015-04-30 DIAGNOSIS — E785 Hyperlipidemia, unspecified: Secondary | ICD-10-CM

## 2015-04-30 DIAGNOSIS — I2510 Atherosclerotic heart disease of native coronary artery without angina pectoris: Secondary | ICD-10-CM | POA: Diagnosis not present

## 2015-04-30 DIAGNOSIS — I739 Peripheral vascular disease, unspecified: Secondary | ICD-10-CM

## 2015-04-30 DIAGNOSIS — I10 Essential (primary) hypertension: Secondary | ICD-10-CM | POA: Diagnosis not present

## 2015-04-30 DIAGNOSIS — I779 Disorder of arteries and arterioles, unspecified: Secondary | ICD-10-CM | POA: Diagnosis not present

## 2015-04-30 NOTE — Progress Notes (Signed)
Chief Complaint  Patient presents with  . Follow-up  . Hypertension  . Coronary Artery Disease    History of Present Illness: 82 yo WF with history of coronary artery disease s/p 2V CABG in December 2004, HTN, HLD, bilateral carotid artery stenosis, AAA, as well as elevated liver enzymes on Lipitor who is here today for cardiac follow up. She has been followed in the past by Dr. Haroldine Laws. Carotid u/s June 2016 with stable disease, 60-79% bilaterally. Abdominal u/s 11/18/10 showed very small AAA 2.1x 2.1 cm, stable. Stress test January 2016 with no ischemia.   Returns for routine follow up today. She is doing well. She has had no chest pain or SOB. She has rare episodes of dizziness. No near syncope or syncope.  No fever, chills, diarrhea.   Primary Care Physician: Shanon Ace  Past Medical History  Diagnosis Date  . CAD (coronary artery disease)     including LM disease; s/p CABG  . HTN (hypertension)   . Hyperlipidemia   . Bilateral carotid artery stenosis   . OA (osteoarthritis)     severe; s/p bilateral hip replacement  . History of colon cancer     s/p resection in 2004  . AAA (abdominal aortic aneurysm) (HCC)     small  . History of colonoscopy   . History of chickenpox     Past Surgical History  Procedure Laterality Date  . Partial colectomy  2004    sigmoid resection  . Left hip replacement  2007  . Right hip replacement  2008  . Coronary artery bypass graft  2004  . Refractive surgery  2013    had a film over eye removed.    Current Outpatient Prescriptions  Medication Sig Dispense Refill  . Ascorbic Acid (VITAMIN C) 1000 MG tablet Take 1,000 mg by mouth daily.    Marland Kitchen aspirin (ADULT ASPIRIN EC LOW STRENGTH) 81 MG EC tablet Take 81 mg by mouth daily.      . Cholecalciferol (VITAMIN D3) 5000 units CAPS Take 1 capsule by mouth daily.    . clopidogrel (PLAVIX) 75 MG tablet Take 1 tablet (75 mg total) by mouth daily. 90 tablet 3  . ezetimibe (ZETIA) 10 MG tablet  Take 1 tablet (10 mg total) by mouth daily. 90 tablet 3  . Magnesium Hydroxide (MAGNESIA PO) Take 1,000 mg by mouth daily.    . metoprolol succinate (TOPROL-XL) 50 MG 24 hr tablet Take 1 tablet (50 mg total) by mouth daily. 90 tablet 3  . Multiple Vitamins-Minerals (ALIVE WOMENS 50+ PO) Take 1 tablet by mouth daily.    . rosuvastatin (CRESTOR) 40 MG tablet Take 1 tablet (40 mg total) by mouth daily. 90 tablet 3  . telmisartan (MICARDIS) 20 MG tablet Take 1 tablet (20 mg total) by mouth daily. 90 tablet 3  . TURMERIC PO Take by mouth.     No current facility-administered medications for this visit.    No Known Allergies  Social History   Social History  . Marital Status: Widowed    Spouse Name: N/A  . Number of Children: 3  . Years of Education: N/A   Occupational History  . 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 Smoking status: Never Smoker   . Smokeless tobacco: Not on file  . Alcohol Use: No  . Drug Use: No  . Sexual Activity: Not on file   Other Topics Concern  . 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      [id number]  Children are well   Lives independently by herself no pets family daughter  lives close by 8 hours of sleep at least    Negative TAD   14 years of education      Hearing aid and dentures   No falls .  Has smoke detector and wears seat belts. . No excess sun exposure. . No depression             Family History  Problem Relation Age of Onset  . Cancer Mother     intra-abdominal/gallbladder  . Cholelithiasis Mother   . Heart attack Father     49  . Heart disease Father   . Colon cancer Neg Hx   . Diabetes Sister     76de  . Other Brother     GSW 45     Review of Systems:  As stated in the HPI and otherwise negative.   BP 102/66 mmHg  Pulse 66  Ht 5\' 1"  (1.549 m)  Wt 131 lb (59.421 kg)  BMI  24.76 kg/m2  Physical Examination: General: Well developed, well nourished, NAD HEENT: OP clear, mucus membranes moist SKIN: warm, dry. No rashes. Neuro: No focal deficits Musculoskeletal: Muscle strength 5/5 all ext Psychiatric: Mood and affect normal Neck: No JVD, faint bilateral carotid bruits, no thyromegaly, no lymphadenopathy. Lungs:Clear bilaterally, no wheezes, rhonci, crackles Cardiovascular: Regular rate and rhythm. No murmurs, gallops or rubs. Abdomen:Soft. Bowel sounds present. Non-tender.  Extremities: No lower extremity edema. Pulses are 1 + in the bilateral DP/PT  EKG:  EKG is ordered today. The ekg ordered today demonstrates NSR, rate 64 bpm. incomplete RBBB  Recent Labs: 06/21/2014: ALT 33; BUN 25*; Creatinine, Ser 1.32*; Hemoglobin 12.8; Platelets 190.0; Potassium 4.7; Sodium 139; TSH 3.21   Lipid Panel    Component Value Date/Time   CHOL 116 06/21/2014 1141   TRIG 167.0* 06/21/2014 1141   HDL 52.20 06/21/2014 1141   CHOLHDL 2 06/21/2014 1141   VLDL 33.4 06/21/2014 1141   LDLCALC 30 06/21/2014 1141   LDLDIRECT 89.5 08/06/2007 0951     Wt Readings from Last 3 Encounters:  04/30/15 131 lb (59.421 kg)  09/20/14 131 lb (59.421 kg)  06/21/14 133 lb 8 oz (60.555 kg)     Other studies Reviewed: Additional studies/ records that were reviewed today include: . Review of the above records demonstrates:     Assessment and Plan:   1. CAD: s/p CABG in 2004. Lexiscan stress myoview January 2016 with no ischemia. She is on good medical therapy. Will continue current meds including ASA/Plavix, Crestor, Toprol, Micardis, Zetia.   2. CAROTID ARTERY DISEASE: She is known to have bilateral 60-79% stenoses which was stable in June 2016. She has had stable disease for years. She wishes to have her carotids done once yearly so we will arrange for repeat dopplers in June 2017  3. HTN: BP well controlled at home. Will follow at home.   4. HLD: Lipids have been very well  controlled.  Continue statin.   Current medicines are reviewed at length with the patient today.  The patient does not have concerns regarding medicines.  The following changes have been made:  no change  Labs/ tests ordered today include:   Orders Placed This Encounter  Procedures  . EKG 12-Lead  Disposition:   FU with me in 12 months  Signed, Lauree Chandler, MD 04/30/2015 11:41 AM    [facility] [address], [place], [place]  [address] Phone: [phone]; Fax: [phone]

## 2015-04-30 NOTE — Patient Instructions (Signed)
Medication Instructions:  Your physician recommends that you continue on your current medications as directed. Please refer to the Current Medication list given to you today.   Labwork: none  Testing/Procedures: Your physician has requested that you have a carotid duplex. This test is an ultrasound of the carotid arteries in your neck. It looks at blood flow through these arteries that supply the brain with blood. Allow one hour for this exam. There are no restrictions or special instructions. To be done in June     Follow-Up: Your physician wants you to follow-up in: 12 months.  You will receive a reminder letter in the mail two months in advance. If you don't receive a letter, please call our office to schedule the follow-up appointment.   Any Other Special Instructions Will Be Listed Below (If Applicable).     If you need a refill on your cardiac medications before your next appointment, please call your pharmacy.

## 2015-08-29 ENCOUNTER — Ambulatory Visit (HOSPITAL_COMMUNITY)
Admission: RE | Admit: 2015-08-29 | Discharge: 2015-08-29 | Disposition: A | Discharge status: Home / Self Care | Payer: Medicare Other | Source: Ambulatory Visit | Attending: Cardiovascular Disease | Admitting: Cardiovascular Disease

## 2015-08-29 DIAGNOSIS — I2510 Atherosclerotic heart disease of native coronary artery without angina pectoris: Secondary | ICD-10-CM | POA: Diagnosis not present

## 2015-08-29 DIAGNOSIS — E785 Hyperlipidemia, unspecified: Secondary | ICD-10-CM | POA: Insufficient documentation

## 2015-08-29 DIAGNOSIS — I6523 Occlusion and stenosis of bilateral carotid arteries: Secondary | ICD-10-CM | POA: Insufficient documentation

## 2015-08-29 DIAGNOSIS — I779 Disorder of arteries and arterioles, unspecified: Secondary | ICD-10-CM | POA: Diagnosis not present

## 2015-08-29 DIAGNOSIS — I10 Essential (primary) hypertension: Secondary | ICD-10-CM | POA: Diagnosis not present

## 2015-08-29 DIAGNOSIS — I739 Peripheral vascular disease, unspecified: Secondary | ICD-10-CM

## 2015-09-27 ENCOUNTER — Telehealth: Payer: Self-pay | Admitting: Cardiovascular Disease

## 2015-09-27 ENCOUNTER — Encounter: Payer: Self-pay | Admitting: Cardiovascular Disease

## 2015-09-27 NOTE — Telephone Encounter (Signed)
Cardiac clearance letter per Dr Angelena Form, was sent to [facility], ATTN: Deanna and Dr Nelva Bush, at fax information provided below.

## 2015-09-27 NOTE — Telephone Encounter (Signed)
Will route this clearance request to Dr Angelena Form for further review and recommendation, and someone from the office will follow-up and send a fax to Dr Nelva Bush thereafter.

## 2015-09-27 NOTE — Telephone Encounter (Signed)
I have written a letter. Can we fax this to the surgeon? Thanks, chris

## 2015-09-27 NOTE — Telephone Encounter (Signed)
New message      Request for surgical clearance:  What type of surgery is being performed? Back injection 1. When is this surgery scheduled? 10-05-15  Are there any medications that need to be held prior to surgery and how long? Hold plavix 5 days prior 2. Name of physician performing surgery?  Dr Nelva Bush  3. What is your office phone and fax number? [phone]

## 2015-10-03 ENCOUNTER — Ambulatory Visit (INDEPENDENT_AMBULATORY_CARE_PROVIDER_SITE_OTHER): Payer: Medicare Other | Admitting: Internal Medicine

## 2015-10-03 ENCOUNTER — Encounter: Payer: Self-pay | Admitting: Internal Medicine

## 2015-10-03 VITALS — BP 140/70 | Temp 98.4°F | Ht 58.75 in | Wt 130.7 lb

## 2015-10-03 DIAGNOSIS — R54 Age-related physical debility: Secondary | ICD-10-CM

## 2015-10-03 DIAGNOSIS — IMO0001 Reserved for inherently not codable concepts without codable children: Secondary | ICD-10-CM

## 2015-10-03 DIAGNOSIS — Z0000 Encounter for general adult medical examination without abnormal findings: Secondary | ICD-10-CM

## 2015-10-03 DIAGNOSIS — Z974 Presence of external hearing-aid: Secondary | ICD-10-CM | POA: Diagnosis not present

## 2015-10-03 DIAGNOSIS — I10 Essential (primary) hypertension: Secondary | ICD-10-CM | POA: Diagnosis not present

## 2015-10-03 DIAGNOSIS — I2510 Atherosclerotic heart disease of native coronary artery without angina pectoris: Secondary | ICD-10-CM | POA: Diagnosis not present

## 2015-10-03 DIAGNOSIS — I25119 Atherosclerotic heart disease of native coronary artery with unspecified angina pectoris: Secondary | ICD-10-CM

## 2015-10-03 DIAGNOSIS — R42 Dizziness and giddiness: Secondary | ICD-10-CM

## 2015-10-03 LAB — LIPID PANEL
CHOLESTEROL: 114 mg/dL (ref 0–200)
HDL: 44.8 mg/dL (ref >39.00)
LDL Cholesterol: 41 mg/dL (ref 0–99)
NonHDL: 69.48
TRIGLYCERIDES: 142 mg/dL (ref 0.0–149.0)
Total CHOL/HDL Ratio: 3
VLDL: 28.4 mg/dL (ref 0.0–40.0)

## 2015-10-03 LAB — CBC WITH DIFFERENTIAL/PLATELET
BASOS ABS: 0 10*3/uL (ref 0.0–0.1)
Basophils Relative: 0.4 % (ref 0.0–3.0)
Eosinophils Absolute: 0.1 10*3/uL (ref 0.0–0.7)
Eosinophils Relative: 1.1 % (ref 0.0–5.0)
HCT: 38 % (ref 36.0–46.0)
Hemoglobin: 13.1 g/dL (ref 12.0–15.0)
LYMPHS ABS: 1.3 10*3/uL (ref 0.7–4.0)
Lymphocytes Relative: 21.9 % (ref 12.0–46.0)
MCHC: 34.4 g/dL (ref 30.0–36.0)
MCV: 83.7 fl (ref 78.0–100.0)
MONO ABS: 0.4 10*3/uL (ref 0.1–1.0)
Monocytes Relative: 7.2 % (ref 3.0–12.0)
NEUTROS ABS: 4 10*3/uL (ref 1.4–7.7)
NEUTROS PCT: 69.4 % (ref 43.0–77.0)
PLATELETS: 212 10*3/uL (ref 150.0–400.0)
RBC: 4.54 Mil/uL (ref 3.87–5.11)
RDW: 15.4 % (ref 11.5–15.5)
WBC: 5.7 10*3/uL (ref 4.0–10.5)

## 2015-10-03 LAB — BASIC METABOLIC PANEL
BUN: 29 mg/dL — AB (ref 6–23)
CALCIUM: 9.7 mg/dL (ref 8.4–10.5)
CO2: 25 meq/L (ref 19–32)
CREATININE: 1.63 mg/dL — AB (ref 0.40–1.20)
Chloride: 97 mEq/L (ref 96–112)
GFR: 31.72 mL/min — AB (ref >60.00)
GLUCOSE: 97 mg/dL (ref 70–99)
Potassium: 4.7 mEq/L (ref 3.5–5.1)
Sodium: 145 mEq/L (ref 135–145)

## 2015-10-03 LAB — HEPATIC FUNCTION PANEL
ALBUMIN: 4.4 g/dL (ref 3.5–5.2)
ALT: 22 U/L (ref 0–35)
AST: 27 U/L (ref 0–37)
Alkaline Phosphatase: 82 U/L (ref 39–117)
Bilirubin, Direct: 0.3 mg/dL (ref 0.0–0.3)
TOTAL PROTEIN: 6.8 g/dL (ref 6.0–8.3)
Total Bilirubin: 1.5 mg/dL — ABNORMAL HIGH (ref 0.2–1.2)

## 2015-10-03 LAB — TSH: TSH: 3.87 u[IU]/mL (ref 0.35–4.50)

## 2015-10-03 NOTE — Progress Notes (Signed)
Pre visit review using our clinic review tool, if applicable. No additional management support is needed unless otherwise documented below in the visit note.  Chief Complaint  Patient presents with  . Medicare Wellness    HPI: Audrey [name] 82 y.o. comes in today for Preventive Medicare wellness visit . And Chronic disease management her with daughter today  Cv followed cardiology see below assessment in February  Since last visit. No change  Orthostatic dizziness  At times.  Getting up from bed turning etc . Stumbled at timse when turns  Lives alone  Is well generally  Otherwise  No syncope  Lipids on meds  No se  No bleeding   Health Maintenance  Topic Date Due  . ZOSTAVAX  10/02/2016 (Originally [birth year]/1988)  . TETANUS/TDAP  10/02/2016 (Originally 10/26/1945)  . PNA vac Low Risk Adult (2 of 2 - PPSV23) 10/02/2048 (Originally 06/21/2015)  . INFLUENZA VACCINE  10/23/2015  . DEXA SCAN  Completed   Health Maintenance Review LIFESTYLE:  TADneg  Sugar beverages:pepsi qd  Sleep: 8hours    MEDICARE DOCUMENT QUESTIONS  TO SCAN   Hearing:  Hearing aids   Vision:  No limitations at present . Last eye check UTD  Safety:  Has smoke detector and wears seat belts.  No firearms. No excess sun exposure.   Falls:   Tripped no injury   Advance directive :  Reviewed  Has one.  Memory: Felt to be good  , no concern from her or her family.  Depression: No anhedonia unusual crying or depressive symptoms  Nutrition: Eats well balanced diet; adequate calcium and vitamin D. No swallowing chewing problems.  Injury: no major injuries in the last six months.  Other healthcare providers:  Reviewed today .  Social:  alone No pets.   Preventive parameters: up-to-date  Reviewed   ADLS:   There are no problems or need for assistance  driving, feeding, obtaining food, dressing, toileting and bathing, managing money using phone. She is independent. Family concer about fall if out and about     ROS:  GEN/ HEENT: No fever, significant weight changes sweats headaches vision problems hearing changes, CV/ PULM; No chest pain shortness of breath cough, syncope,edema  change in exercise tolerance. GI /GU: No adominal pain, vomiting, change in bowel habits. No blood in the stool. No significant GU symptoms. SKIN/HEME: ,no acute skin rashes suspicious lesions or bleeding. No lymphadenopathy, nodules, masses.  NEURO/ PSYCH:  No neurologic signs such as weakness numbness. No depression anxiety. IMM/ Allergy: No unusual infections.  Allergy .   REST of 12 system review negative except as per HPI   Past Medical History  Diagnosis Date  . CAD (coronary artery disease)     including LM disease; s/p CABG  . HTN (hypertension)   . Hyperlipidemia   . Bilateral carotid artery stenosis   . OA (osteoarthritis)     severe; s/p bilateral hip replacement  . History of colon cancer     s/p resection in 2004  . AAA (abdominal aortic aneurysm) (HCC)     small  . History of colonoscopy   . History of chickenpox     Family History  Problem Relation Age of Onset  . Cancer Mother     intra-abdominal/gallbladder  . Cholelithiasis Mother   . Heart attack Father     8  . Heart disease Father   . Colon cancer Neg Hx   . Diabetes Sister     56de  .  Other Brother     GSW 78     Social History   Social History  . Marital Status: Widowed    Spouse Name: N/A  . Number of Children: 3  . Years of Education: N/A   Occupational History  . 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 Smoking status: Never Smoker   . Smokeless tobacco: None  . Alcohol Use: No  . Drug Use: No  . Sexual Activity: Not Asked   Other Topics Concern  . 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      [id number]  Children are well   Lives independently by herself no pets  family daughter  lives close by 8 hours of sleep at least    Negative TAD   14 years of education      Hearing aid and dentures   No falls .  Has smoke detector and wears seat belts. . No excess sun exposure. . No depression             Outpatient Encounter Prescriptions as of 10/03/2015  Medication Sig  . Ascorbic Acid (VITAMIN C) 1000 MG tablet Take 1,000 mg by mouth daily.  Marland Kitchen aspirin (ADULT ASPIRIN EC LOW STRENGTH) 81 MG EC tablet Take 81 mg by mouth daily.    . Cholecalciferol (VITAMIN D3) 5000 units CAPS Take 1 capsule by mouth daily.  . clopidogrel (PLAVIX) 75 MG tablet Take 1 tablet (75 mg total) by mouth daily.  Marland Kitchen ezetimibe (ZETIA) 10 MG tablet Take 1 tablet (10 mg total) by mouth daily.  . Magnesium Hydroxide (MAGNESIA PO) Take 1,000 mg by mouth daily.  . metoprolol succinate (TOPROL-XL) 50 MG 24 hr tablet Take 1 tablet (50 mg total) by mouth daily.  . Multiple Vitamins-Minerals (ALIVE WOMENS 50+ PO) Take 1 tablet by mouth daily.  . rosuvastatin (CRESTOR) 40 MG tablet Take 1 tablet (40 mg total) by mouth daily.  Marland Kitchen telmisartan (MICARDIS) 20 MG tablet Take 1 tablet (20 mg total) by mouth daily.  . [DISCONTINUED] TURMERIC PO Take by mouth.   No facility-administered encounter medications on file as of 10/03/2015.    EXAM:  BP 140/70 mmHg  Temp(Src) 98.4 F (36.9 C) (Oral)  Ht 4' 10.75" (1.492 m)  Wt 130 lb 11.2 oz (59.285 kg)  BMI 26.63 kg/m2  Body mass index is 26.63 kg/(m^2).  Physical Exam: Vital signs reviewed TWS:FKCL is a well-developed well-nourished alert cooperative   who appears stated age in no acute distress.  HEENT: normocephalic atraumatic , Eyes: PERRL EOM's full, conjunctiva clear, Nares: paten,t no deformity discharge or tenderness., Ears: no deformity aids in place  . Mouth: clear OP, no lesions, edema.  Moist mucous membranes NECK: supple without masses, thyromegaly or bruits. CHEST/PULM:  Clear to auscultation and percussion breath sounds equal no  wheeze , rales or rhonchi. Min kyphosis   Breast: normal by inspection . No dimpling, discharge, masses, tenderness or discharge . CV: PMI is nondisplaced, S1 S2 no gallops, murmurs, rubs. Peripheral pulses are present  without delay.No JVD .  ABDOMEN: Bowel sounds normal nontender  No guard or rebound, no hepato splenomegal no CVA tenderness.   Extremtities:  No clubbing cyanosis or edema, no acute joint swelling or redness no focal atrophy NEURO:  Oriented x3, cranial nerves 3-12 appear to be intact, no obvious  focal weakness,gait within normal limits no abnormal reflexes or asymmetrical SKIN: No acute rashes normal turgor, color, no bruising or petechiae. PSYCH: Oriented, good eye contact, no obvious depression anxiety, cognition and judgment appear normal. fordicusseion LN: no cervical axillary inguinal adenopathy No noted deficits in memory, attention, and speech.   Lab Results  Component Value Date   WBC 5.7 10/03/2015   HGB 13.1 10/03/2015   HCT 38.0 10/03/2015   PLT 212.0 10/03/2015   GLUCOSE 97 10/03/2015   CHOL 114 10/03/2015   TRIG 142.0 10/03/2015   HDL 44.80 10/03/2015   LDLDIRECT 89.5 08/06/2007   LDLCALC 41 10/03/2015   ALT 22 10/03/2015   AST 27 10/03/2015   NA 145 10/03/2015   K 4.7 10/03/2015   CL 97 10/03/2015   CREATININE 1.63* 10/03/2015   BUN 29* 10/03/2015   CO2 25 10/03/2015   TSH 3.87 10/03/2015   Wt Readings from Last 3 Encounters:  10/03/15 130 lb 11.2 oz (59.285 kg)  04/30/15 131 lb (59.421 kg)  09/20/14 131 lb (59.421 kg)   BP Readings from Last 3 Encounters:  10/03/15 140/70  04/30/15 102/66  09/20/14 158/58    ASSESSMENT AND PLAN:  Discussed the following assessment and plan:  Medicare annual wellness visit, subsequent  Essential hypertension - Plan: Basic metabolic panel, Hepatic function panel, Lipid panel, TSH, CBC with Differential/Platelet  Age factor  Orthostatic lightheadedness - Plan: Basic metabolic panel, Hepatic function  panel, Lipid panel, TSH, CBC with Differential/Platelet  Wears hearing aid Counseled. Decline pneumovax but probably had this at age 89 documentation  In paper work NA to [place] today Patient Care Team: Burnis Medin, MD as PCP - General (Internal Medicine) Paralee Cancel, MD (Orthopedic Surgery) Jolaine Artist, MD (Cardiology) Burnell Blanks, MD as Attending Physician (Cardiology) Calvert Cantor, MD as Attending Physician (Ophthalmology)  Patient Instructions   Will notify you  of labs when available.   Your blood pressure is elevated .  But better on repeat  Today make sure at goal at home and contact  [place] cardiology team  If need help with managements goal is  Below 140/90 most of the time  But definitly below 150/90  Can dheck BP if light headed to see if Bp drops some when  You stand up quickly .   Sit on edge of bed for 5 minutes or so before standing in the am .   Consider tai chi classes for balance and exercise      Fall Prevention in the Home  Falls can cause injuries and can affect people from all age groups. There are many simple things that you can do to make your home safe and to help prevent falls. WHAT CAN I DO ON THE OUTSIDE OF MY HOME?  Regularly repair the edges of walkways and driveways and fix any cracks.  Remove high doorway thresholds.  Trim any shrubbery on the main path into your home.  Use bright outdoor lighting.  Clear walkways of debris and clutter, including tools and rocks.  Regularly check that handrails are securely fastened and in good repair. Both sides of any steps should have handrails.  Install guardrails along the edges of any raised decks or porches.  Have leaves, snow, and ice cleared regularly.  Use sand or salt on walkways during winter months.  In the garage, clean up any spills right away, including grease or oil spills. WHAT CAN I DO IN THE BATHROOM?  Use night lights.  Install grab  bars by the toilet and in the  tub and shower. Do not use towel bars as grab bars.  Use non-skid mats or decals on the floor of the tub or shower.  If you need to sit down while you are in the shower, use a plastic, non-slip stool.Marland Kitchen  Keep the floor dry. Immediately clean up any water that spills on the floor.  Remove soap buildup in the tub or shower on a regular basis.  Attach bath mats securely with double-sided non-slip rug tape.  Remove throw rugs and other tripping hazards from the floor. WHAT CAN I DO IN THE BEDROOM?  Use night lights.  Make sure that a bedside light is easy to reach.  Do not use oversized bedding that drapes onto the floor.  Have a firm chair that has side arms to use for getting dressed.  Remove throw rugs and other tripping hazards from the floor. WHAT CAN I DO IN THE KITCHEN?   Clean up any spills right away.  Avoid walking on wet floors.  Place frequently used items in easy-to-reach places.  If you need to reach for something above you, use a sturdy step stool that has a grab bar.  Keep electrical cables out of the way.  Do not use floor polish or wax that makes floors slippery. If you have to use wax, make sure that it is non-skid floor wax.  Remove throw rugs and other tripping hazards from the floor. WHAT CAN I DO IN THE STAIRWAYS?  Do not leave any items on the stairs.  Make sure that there are handrails on both sides of the stairs. Fix handrails that are broken or loose. Make sure that handrails are as long as the stairways.  Check any carpeting to make sure that it is firmly attached to the stairs. Fix any carpet that is loose or worn.  Avoid having throw rugs at the top or bottom of stairways, or secure the rugs with carpet tape to prevent them from moving.  Make sure that you have a light switch at the top of the stairs and the bottom of the stairs. If you do not have them, have them installed. WHAT ARE SOME OTHER FALL PREVENTION TIPS?  Wear closed-toe shoes  that fit well and support your feet. Wear shoes that have rubber soles or low heels.  When you use a stepladder, make sure that it is completely opened and that the sides are firmly locked. Have someone hold the ladder while you are using it. Do not climb a closed stepladder.  Add color or contrast paint or tape to grab bars and handrails in your home. Place contrasting color strips on the first and last steps.  Use mobility aids as needed, such as canes, walkers, scooters, and crutches.  Turn on lights if it is dark. Replace any light bulbs that burn out.  Set up furniture so that there are clear paths. Keep the furniture in the same spot.  Fix any uneven floor surfaces.  Choose a carpet design that does not hide the edge of steps of a stairway.  Be aware of any and all pets.  Review your medicines with your healthcare provider. Some medicines can cause dizziness or changes in blood pressure, which increase your risk of falling. Talk with your health care provider about other ways that you can decrease your risk of falls. This may include working with a physical therapist or trainer to improve your strength, balance, and endurance.  This information is not intended to replace advice given to you by your health care provider. Make sure you discuss any questions you have with your health care provider.   Document Released: 02/28/2002 Document Revised: 07/25/2014 Document Reviewed: 04/14/2014 Elsevier Interactive Patient Education 2016 [employer]   Orthostatic Hypotension Orthostatic hypotension is a sudden drop in blood pressure. It happens when you quickly stand up from a seated or lying position. You may feel dizzy or light-headed. This can last for just a few seconds or for up to a few minutes. It is usually not a serious problem. However, if this happens frequently or gets worse, it can be a sign of something more serious. CAUSES  Different things can cause orthostatic  hypotension, including:   Loss of body fluids (dehydration).  Medicines that lower blood pressure.  Sudden changes in posture, such as standing up quickly after you have been sitting or lying down.  Taking too much of your medicine. SIGNS AND SYMPTOMS   Light-headedness or dizziness.   Fainting or near-fainting.   A fast heart rate.   Weakness.   Feeling tired (fatigue).  DIAGNOSIS  Your health care provider may do several things to help diagnose your condition and identify the cause. These may include:   Taking a medical history and doing a physical exam.  Checking your blood pressure. Your health care provider will check your blood pressure when you are:  Lying down.  Sitting.  Standing.  Using tilt table testing. In this test, you lie down on a table that moves from a lying position to a standing position. You will be strapped onto the table. This test monitors your blood pressure and heart rate when you are in different positions. TREATMENT  Treatment will vary depending on the cause. Possible treatments include:   Changing the dosage of your medicines.  Wearing compression stockings on your lower legs.  Standing up slowly after sitting or lying down.  Eating more salt.  Eating frequent, small meals.  In some cases, getting IV fluids.  Taking medicine to enhance fluid retention. HOME CARE INSTRUCTIONS  Only take over-the-counter or prescription medicines as directed by your health care provider.  Follow your health care provider's instructions for changing the dosage of your current medicines.  Do not stop or adjust your medicine on your own.  Stand up slowly after sitting or lying down. This allows your body to adjust to the different position.  Wear compression stockings as directed.  Eat extra salt as directed.  Do not add extra salt to your diet unless directed to by your health care provider.  Eat frequent, small meals.  Avoid  standing suddenly after eating.  Avoid hot showers or excessive heat as directed by your health care provider.  Keep all follow-up appointments. SEEK MEDICAL CARE IF:  You continue to feel dizzy or light-headed after standing.  You feel groggy or confused.  You feel cold, clammy, or sick to your stomach (nauseous).  You have blurred vision.  You feel short of breath. SEEK IMMEDIATE MEDICAL CARE IF:   You faint after standing.  You have chest pain.  You have difficulty breathing.   You lose feeling or movement in your arms or legs.   You have slurred speech or difficulty talking, or you are unable to talk.  MAKE SURE YOU:   Understand these instructions.  Will watch your condition.  Will get help right away if you are not doing well or get worse.  This information is not intended to replace advice given to you by your health care provider. Make sure you discuss any questions you have with your health care provider.   Document Released: 02/28/2002 Document Revised: 03/15/2013 Document Reviewed: 12/31/2012 Elsevier Interactive Patient Education 2016 [place] K. Panosh M.D.   Assessment and Plan:   1. CAD: s/p CABG in 2004. Lexiscan stress myoview January 2016 with no ischemia. She is on good medical therapy. Will continue current meds including ASA/Plavix, Crestor, Toprol, Micardis, Zetia.   2. CAROTID ARTERY DISEASE: She is known to have bilateral 60-79% stenoses which was stable in June 2016. She has had stable disease for years. She wishes to have her carotids done once yearly so we will arrange for repeat dopplers in June 2017  3. HTN: BP well controlled at home. Will follow at home.   4. HLD: Lipids have been very well controlled. Continue statin.   Current medicines are reviewed at length with the patient today. The patient does not have concerns regarding medicines.  The following changes have been made: no change  Labs/ tests  ordered today include:  Orders Placed This Encounter  Procedures  . EKG 12-Lead    Disposition: FU with me in 12 months  Signed, Lauree Chandler, MD 04/30/2015 11:41 AM

## 2015-10-03 NOTE — Patient Instructions (Addendum)
Will notify you  of labs when available.   Your blood pressure is elevated .  But better on repeat  Today make sure at goal at home and contact  [place]ology team  If need help with managements goal is  Below 140/90 most of the time  But definitly below 150/90  Can dheck BP if light headed to see if Bp drops some when  You stand up quickly .   Sit on edge of bed for 5 minutes or so before standing in the am .   Consider tai chi classes for balance and exercise      Fall Prevention in the Home  Falls can cause injuries and can affect people from all age groups. There are many simple things that you can do to make your home safe and to help prevent falls. WHAT CAN I DO ON THE OUTSIDE OF MY HOME?  Regularly repair the edges of walkways and driveways and fix any cracks.  Remove high doorway thresholds.  Trim any shrubbery on the main path into your home.  Use bright outdoor lighting.  Clear walkways of debris and clutter, including tools and rocks.  Regularly check that handrails are securely fastened and in good repair. Both sides of any steps should have handrails.  Install guardrails along the edges of any raised decks or porches.  Have leaves, snow, and ice cleared regularly.  Use sand or salt on walkways during winter months.  In the garage, clean up any spills right away, including grease or oil spills. WHAT CAN I DO IN THE BATHROOM?  Use night lights.  Install grab bars by the toilet and in the tub and shower. Do not use towel bars as grab bars.  Use non-skid mats or decals on the floor of the tub or shower.  If you need to sit down while you are in the shower, use a plastic, non-slip stool.Marland Kitchen  Keep the floor dry. Immediately clean up any water that spills on the floor.  Remove soap buildup in the tub or shower on a regular basis.  Attach bath mats securely with double-sided non-slip rug tape.  Remove throw rugs and other tripping hazards from the  floor. WHAT CAN I DO IN THE BEDROOM?  Use night lights.  Make sure that a bedside light is easy to reach.  Do not use oversized bedding that drapes onto the floor.  Have a firm chair that has side arms to use for getting dressed.  Remove throw rugs and other tripping hazards from the floor. WHAT CAN I DO IN THE KITCHEN?   Clean up any spills right away.  Avoid walking on wet floors.  Place frequently used items in easy-to-reach places.  If you need to reach for something above you, use a sturdy step stool that has a grab bar.  Keep electrical cables out of the way.  Do not use floor polish or wax that makes floors slippery. If you have to use wax, make sure that it is non-skid floor wax.  Remove throw rugs and other tripping hazards from the floor. WHAT CAN I DO IN THE STAIRWAYS?  Do not leave any items on the stairs.  Make sure that there are handrails on both sides of the stairs. Fix handrails that are broken or loose. Make sure that handrails are as long as the stairways.  Check any carpeting to make sure that it is firmly attached to the stairs. Fix any carpet that is loose or worn.  Avoid having throw rugs at the top or bottom of stairways, or secure the rugs with carpet tape to prevent them from moving.  Make sure that you have a light switch at the top of the stairs and the bottom of the stairs. If you do not have them, have them installed. WHAT ARE SOME OTHER FALL PREVENTION TIPS?  Wear closed-toe shoes that fit well and support your feet. Wear shoes that have rubber soles or low heels.  When you use a stepladder, make sure that it is completely opened and that the sides are firmly locked. Have someone hold the ladder while you are using it. Do not climb a closed stepladder.  Add color or contrast paint or tape to grab bars and handrails in your home. Place contrasting color strips on the first and last steps.  Use mobility aids as needed, such as canes, walkers,  scooters, and crutches.  Turn on lights if it is dark. Replace any light bulbs that burn out.  Set up furniture so that there are clear paths. Keep the furniture in the same spot.  Fix any uneven floor surfaces.  Choose a carpet design that does not hide the edge of steps of a stairway.  Be aware of any and all pets.  Review your medicines with your healthcare provider. Some medicines can cause dizziness or changes in blood pressure, which increase your risk of falling. Talk with your health care provider about other ways that you can decrease your risk of falls. This may include working with a physical therapist or trainer to improve your strength, balance, and endurance.   This information is not intended to replace advice given to you by your health care provider. Make sure you discuss any questions you have with your health care provider.   Document Released: 02/28/2002 Document Revised: 07/25/2014 Document Reviewed: 04/14/2014 Elsevier Interactive Patient Education 2016 Elsevier Inc.   Orthostatic Hypotension Orthostatic hypotension is a sudden drop in blood pressure. It happens when you quickly stand up from a seated or lying position. You may feel dizzy or light-headed. This can last for just a few seconds or for up to a few minutes. It is usually not a serious problem. However, if this happens frequently or gets worse, it can be a sign of something more serious. CAUSES  Different things can cause orthostatic hypotension, including:   Loss of body fluids (dehydration).  Medicines that lower blood pressure.  Sudden changes in posture, such as standing up quickly after you have been sitting or lying down.  Taking too much of your medicine. SIGNS AND SYMPTOMS   Light-headedness or dizziness.   Fainting or near-fainting.   A fast heart rate.   Weakness.   Feeling tired (fatigue).  DIAGNOSIS  Your health care provider may do several things to help diagnose your  condition and identify the cause. These may include:   Taking a medical history and doing a physical exam.  Checking your blood pressure. Your health care provider will check your blood pressure when you are:  Lying down.  Sitting.  Standing.  Using tilt table testing. In this test, you lie down on a table that moves from a lying position to a standing position. You will be strapped onto the table. This test monitors your blood pressure and heart rate when you are in different positions. TREATMENT  Treatment will vary depending on the cause. Possible treatments include:   Changing the dosage of your medicines.  Wearing compression stockings on  your lower legs.  Standing up slowly after sitting or lying down.  Eating more salt.  Eating frequent, small meals.  In some cases, getting IV fluids.  Taking medicine to enhance fluid retention. HOME CARE INSTRUCTIONS  Only take over-the-counter or prescription medicines as directed by your health care provider.  Follow your health care provider's instructions for changing the dosage of your current medicines.  Do not stop or adjust your medicine on your own.  Stand up slowly after sitting or lying down. This allows your body to adjust to the different position.  Wear compression stockings as directed.  Eat extra salt as directed.  Do not add extra salt to your diet unless directed to by your health care provider.  Eat frequent, small meals.  Avoid standing suddenly after eating.  Avoid hot showers or excessive heat as directed by your health care provider.  Keep all follow-up appointments. SEEK MEDICAL CARE IF:  You continue to feel dizzy or light-headed after standing.  You feel groggy or confused.  You feel cold, clammy, or sick to your stomach (nauseous).  You have blurred vision.  You feel short of breath. SEEK IMMEDIATE MEDICAL CARE IF:   You faint after standing.  You have chest pain.  You have  difficulty breathing.   You lose feeling or movement in your arms or legs.   You have slurred speech or difficulty talking, or you are unable to talk.  MAKE SURE YOU:   Understand these instructions.  Will watch your condition.  Will get help right away if you are not doing well or get worse.   This information is not intended to replace advice given to you by your health care provider. Make sure you discuss any questions you have with your health care provider.   Document Released: 02/28/2002 Document Revised: 03/15/2013 Document Reviewed: 12/31/2012 Elsevier Interactive Patient Education [employer].

## 2015-10-04 ENCOUNTER — Other Ambulatory Visit: Payer: Self-pay | Admitting: Family Medicine

## 2015-10-04 DIAGNOSIS — N289 Disorder of kidney and ureter, unspecified: Secondary | ICD-10-CM

## 2015-10-05 DIAGNOSIS — M545 Low back pain: Secondary | ICD-10-CM | POA: Diagnosis not present

## 2015-10-05 DIAGNOSIS — M47817 Spondylosis without myelopathy or radiculopathy, lumbosacral region: Secondary | ICD-10-CM | POA: Diagnosis not present

## 2015-10-05 DIAGNOSIS — M47816 Spondylosis without myelopathy or radiculopathy, lumbar region: Secondary | ICD-10-CM | POA: Diagnosis not present

## 2015-10-05 DIAGNOSIS — M5442 Lumbago with sciatica, left side: Secondary | ICD-10-CM | POA: Diagnosis not present

## 2015-10-15 ENCOUNTER — Telehealth: Payer: Self-pay | Admitting: *Deleted

## 2015-10-15 NOTE — Telephone Encounter (Signed)
needs hard copy RX, please call her per her request

## 2015-10-16 ENCOUNTER — Other Ambulatory Visit: Payer: Self-pay | Admitting: *Deleted

## 2015-10-16 MED ORDER — METOPROLOL SUCCINATE ER 50 MG PO TB24: 50.0000 mg | ORAL_TABLET | Freq: Every day | ORAL | 3 refills | Status: DC

## 2015-10-16 MED ORDER — TELMISARTAN 20 MG PO TABS: 20.0000 mg | ORAL_TABLET | Freq: Every day | ORAL | 3 refills | Status: DC

## 2015-10-16 MED ORDER — ROSUVASTATIN CALCIUM 40 MG PO TABS: 40.0000 mg | ORAL_TABLET | Freq: Every day | ORAL | 3 refills | Status: DC

## 2015-10-16 MED ORDER — EZETIMIBE 10 MG PO TABS: 10.0000 mg | ORAL_TABLET | Freq: Every day | ORAL | 3 refills | Status: DC

## 2015-10-16 MED ORDER — CLOPIDOGREL BISULFATE 75 MG PO TABS: 75.0000 mg | ORAL_TABLET | Freq: Every day | ORAL | 3 refills | Status: DC

## 2015-10-17 MED ORDER — ROSUVASTATIN CALCIUM 40 MG PO TABS: 40.0000 mg | ORAL_TABLET | Freq: Every day | ORAL | 3 refills | Status: DC

## 2015-10-17 MED ORDER — EZETIMIBE 10 MG PO TABS: 10.0000 mg | ORAL_TABLET | Freq: Every day | ORAL | 3 refills | Status: DC

## 2015-10-17 MED ORDER — TELMISARTAN 20 MG PO TABS
20.0000 mg | ORAL_TABLET | Freq: Every day | ORAL | 3 refills | Status: DC
Start: 2015-10-17 — End: 2016-11-26

## 2015-10-17 MED ORDER — CLOPIDOGREL BISULFATE 75 MG PO TABS: 75.0000 mg | ORAL_TABLET | Freq: Every day | ORAL | 3 refills | Status: DC

## 2015-10-17 MED ORDER — METOPROLOL SUCCINATE ER 50 MG PO TB24: 50.0000 mg | ORAL_TABLET | Freq: Every day | ORAL | 3 refills | Status: DC

## 2015-10-17 NOTE — Addendum Note (Signed)
Addended by: Thompson Grayer on: 10/17/2015 12:46 PM   Modules accepted: Orders

## 2015-10-17 NOTE — Telephone Encounter (Signed)
Pt walked into office and asked that prescriptions be sent to her home.  Printed prescriptions mailed to her.

## 2015-10-26 DIAGNOSIS — M4310 Spondylolisthesis, site unspecified: Secondary | ICD-10-CM | POA: Diagnosis not present

## 2015-10-26 DIAGNOSIS — M4807 Spinal stenosis, lumbosacral region: Secondary | ICD-10-CM | POA: Diagnosis not present

## 2015-10-26 DIAGNOSIS — M5416 Radiculopathy, lumbar region: Secondary | ICD-10-CM | POA: Diagnosis not present

## 2015-10-31 ENCOUNTER — Other Ambulatory Visit (INDEPENDENT_AMBULATORY_CARE_PROVIDER_SITE_OTHER): Payer: Medicare Other

## 2015-10-31 DIAGNOSIS — N289 Disorder of kidney and ureter, unspecified: Secondary | ICD-10-CM | POA: Diagnosis not present

## 2015-10-31 LAB — BASIC METABOLIC PANEL
BUN: 27 mg/dL — AB (ref 6–23)
CALCIUM: 9.5 mg/dL (ref 8.4–10.5)
CHLORIDE: 105 meq/L (ref 96–112)
CO2: 26 mEq/L (ref 19–32)
CREATININE: 1.54 mg/dL — AB (ref 0.40–1.20)
GFR: 33.87 mL/min — AB (ref >60.00)
Glucose, Bld: 98 mg/dL (ref 70–99)
Potassium: 5.1 mEq/L (ref 3.5–5.1)
Sodium: 138 mEq/L (ref 135–145)

## 2015-11-02 ENCOUNTER — Other Ambulatory Visit: Payer: Medicare Other

## 2015-11-06 DIAGNOSIS — M5416 Radiculopathy, lumbar region: Secondary | ICD-10-CM | POA: Diagnosis not present

## 2015-11-16 ENCOUNTER — Telehealth: Payer: Self-pay

## 2015-11-16 NOTE — Telephone Encounter (Signed)
Called patient. Gave lab results. Patient verbalized understanding.  

## 2015-11-16 NOTE — Telephone Encounter (Deleted)
-----   Message from Burnis Medin, MD sent at 11/08/2015  8:22 AM EDT ----- Renal function about the same decrease stable  Repeat  Bmp in 6 months ( no ov needed if  No concerns)

## 2015-11-16 NOTE — Telephone Encounter (Signed)
-----   Message from Burnis Medin, MD sent at 11/08/2015  8:22 AM EDT ----- Renal function about the same decrease stable  Repeat  Bmp in 6 months ( no ov needed if  No concerns)

## 2016-01-18 ENCOUNTER — Emergency Department (HOSPITAL_COMMUNITY): Payer: Medicare Other

## 2016-01-18 ENCOUNTER — Emergency Department (HOSPITAL_COMMUNITY)
Admission: EM | Admit: 2016-01-18 | Discharge: 2016-01-18 | Disposition: A | Discharge status: Home / Self Care | Payer: Medicare Other | Attending: Emergency Medicine | Admitting: Emergency Medicine

## 2016-01-18 ENCOUNTER — Encounter (HOSPITAL_COMMUNITY): Payer: Self-pay | Admitting: *Deleted

## 2016-01-18 DIAGNOSIS — S42031A Displaced fracture of lateral end of right clavicle, initial encounter for closed fracture: Secondary | ICD-10-CM

## 2016-01-18 DIAGNOSIS — Y9389 Activity, other specified: Secondary | ICD-10-CM | POA: Diagnosis not present

## 2016-01-18 DIAGNOSIS — Z85038 Personal history of other malignant neoplasm of large intestine: Secondary | ICD-10-CM | POA: Insufficient documentation

## 2016-01-18 DIAGNOSIS — S42021A Displaced fracture of shaft of right clavicle, initial encounter for closed fracture: Secondary | ICD-10-CM | POA: Diagnosis not present

## 2016-01-18 DIAGNOSIS — I2510 Atherosclerotic heart disease of native coronary artery without angina pectoris: Secondary | ICD-10-CM | POA: Diagnosis not present

## 2016-01-18 DIAGNOSIS — Y999 Unspecified external cause status: Secondary | ICD-10-CM | POA: Diagnosis not present

## 2016-01-18 DIAGNOSIS — S199XXA Unspecified injury of neck, initial encounter: Secondary | ICD-10-CM | POA: Diagnosis not present

## 2016-01-18 DIAGNOSIS — W010XXA Fall on same level from slipping, tripping and stumbling without subsequent striking against object, initial encounter: Secondary | ICD-10-CM | POA: Insufficient documentation

## 2016-01-18 DIAGNOSIS — S0990XA Unspecified injury of head, initial encounter: Secondary | ICD-10-CM | POA: Diagnosis not present

## 2016-01-18 DIAGNOSIS — S4991XA Unspecified injury of right shoulder and upper arm, initial encounter: Secondary | ICD-10-CM | POA: Diagnosis present

## 2016-01-18 DIAGNOSIS — Z7982 Long term (current) use of aspirin: Secondary | ICD-10-CM | POA: Insufficient documentation

## 2016-01-18 DIAGNOSIS — I10 Essential (primary) hypertension: Secondary | ICD-10-CM | POA: Diagnosis not present

## 2016-01-18 DIAGNOSIS — Z96643 Presence of artificial hip joint, bilateral: Secondary | ICD-10-CM | POA: Diagnosis not present

## 2016-01-18 DIAGNOSIS — Z951 Presence of aortocoronary bypass graft: Secondary | ICD-10-CM | POA: Diagnosis not present

## 2016-01-18 DIAGNOSIS — R930 Abnormal findings on diagnostic imaging of skull and head, not elsewhere classified: Secondary | ICD-10-CM | POA: Insufficient documentation

## 2016-01-18 DIAGNOSIS — Y929 Unspecified place or not applicable: Secondary | ICD-10-CM | POA: Insufficient documentation

## 2016-01-18 MED ORDER — IBUPROFEN 400 MG PO TABS
600.0000 mg | ORAL_TABLET | Freq: Once | ORAL | Status: AC
  Administered 2016-01-18: 600 mg via ORAL
  Filled 2016-01-18: qty 1

## 2016-01-18 NOTE — Discharge Instructions (Signed)
Keep arm in sling. Apply ice to affected area. Take ibuprofen as needed for pain. Follow up with Dr. Ninfa Linden with orthopedic surgery for re-evaluation in the next 1-2 weeks.

## 2016-01-18 NOTE — Progress Notes (Signed)
Orthopedic Tech Progress Note Patient Details:  Audrey [name] 1926-10-10 [id number]  Ortho Devices Type of Ortho Device: Arm sling Ortho Device/Splint Interventions: Application   [name] 01/18/2016, 9:57 AM

## 2016-01-18 NOTE — ED Triage Notes (Signed)
PT lives by herself, was trying get out of her recliner at 0530 this am and "just fell".  Denies dizziness.  R shoulder pain and bruising between shoulder and neck. Pt is on blood thinners.

## 2016-01-18 NOTE — ED Provider Notes (Signed)
[facility] DEPT Provider Note   CSN: [id number] Arrival date & time: 01/18/16  4008     History   Chief Complaint Chief Complaint  Patient presents with  . Shoulder Pain  . Fall    HPI Audrey [name] is a 82 y.o. female with a past medical history of AAA, CAD status post CABG, HTN, HLD who presents to the ED today to be evaluated after a mechanical fall. Patient states this morning around 5:30 AM she was standing up from her recliner, tripped and asked only fell landing on her right shoulder. She is unsure if she hit her head but denies any loss of consciousness. She is having minimal pain in her right shoulder and collarbone with bruising. Patient took 2 ibuprofen with significant relief of her pain. She denies any dizziness, confusion, chest pain. Patient takes Plavix.  HPI  Past Medical History:  Diagnosis Date  . AAA (abdominal aortic aneurysm) (HCC)    small  . Bilateral carotid artery stenosis   . CAD (coronary artery disease)    including LM disease; s/p CABG  . History of chickenpox   . History of colon cancer    s/p resection in 2004  . History of colonoscopy   . HTN (hypertension)   . Hyperlipidemia   . OA (osteoarthritis)    severe; s/p bilateral hip replacement    Patient Active Problem List   Diagnosis Date Noted  . Medicare annual wellness visit, subsequent 04/04/2013  . Spinal stenosis, lumbar region, with neurogenic claudication 04/04/2013  . Osteoporosis 04/02/2012  . Wears hearing aid 04/02/2012  . Osteoporosis by dexa forearm 07/27/2011  . CAD (coronary artery disease)   . AAA (abdominal aortic aneurysm) ([place])   . Bilateral carotid artery stenosis   . History of colon cancer   . OA (osteoarthritis)   . Hyperlipidemia   . HTN (hypertension)   . Back pain 04/01/2011  . Left leg pain 04/01/2011  . CAD (coronary artery disease), native coronary artery 11/11/2010  . CAROTID ARTERY DISEASE 10/26/2008  . ABDOMINAL AORTIC ANEURYSM 10/26/2008  .  SEBACEOUS CYST 08/11/2007  . HYPERLIPIDEMIA NEC/NOS 12/22/2006  . HYPERTENSION, BENIGN ESSENTIAL 12/22/2006  . OSTEOARTHROSIS NOS, UNSPECIFIED SITE 12/22/2006    Past Surgical History:  Procedure Laterality Date  . CORONARY ARTERY BYPASS GRAFT  2004  . left hip replacement  2007  . PARTIAL COLECTOMY  2004   sigmoid resection  . REFRACTIVE SURGERY  2013   had a film over eye removed.  . right hip replacement  2008    OB History    No data available       Home Medications    Prior to Admission medications   Medication Sig Start Date End Date Taking? Authorizing Provider  Ascorbic Acid (VITAMIN C) 1000 MG tablet Take 1,000 mg by mouth daily.    Historical Provider, MD  aspirin (ADULT ASPIRIN EC LOW STRENGTH) 81 MG EC tablet Take 81 mg by mouth daily.      Historical Provider, MD  Cholecalciferol (VITAMIN D3) 5000 units CAPS Take 1 capsule by mouth daily.    Historical Provider, MD  clopidogrel (PLAVIX) 75 MG tablet Take 1 tablet (75 mg total) by mouth daily. 10/17/15   Burnell Blanks, MD  ezetimibe (ZETIA) 10 MG tablet Take 1 tablet (10 mg total) by mouth daily. 10/17/15   Burnell Blanks, MD  Magnesium Hydroxide (MAGNESIA PO) Take 1,000 mg by mouth daily.    Historical Provider, MD  metoprolol  succinate (TOPROL-XL) 50 MG 24 hr tablet Take 1 tablet (50 mg total) by mouth daily. 10/17/15   Burnell Blanks, MD  Multiple Vitamins-Minerals (ALIVE WOMENS 50+ PO) Take 1 tablet by mouth daily.    Historical Provider, MD  rosuvastatin (CRESTOR) 40 MG tablet Take 1 tablet (40 mg total) by mouth daily. 10/17/15   Burnell Blanks, MD  telmisartan (MICARDIS) 20 MG tablet Take 1 tablet (20 mg total) by mouth daily. 10/17/15   Burnell Blanks, MD    Family History Family History  Problem Relation Age of Onset  . Cancer Mother     intra-abdominal/gallbladder  . Cholelithiasis Mother   . Heart attack Father     53  . Heart disease Father   . Diabetes Sister      67de  . Other Brother     GSW 59   . Colon cancer Neg Hx     Social History Social History  Substance Use Topics  . Smoking status: Never Smoker  . Smokeless tobacco: Never Used  . Alcohol use No     Allergies   Review of patient's allergies indicates no known allergies.   Review of Systems Review of Systems  All other systems reviewed and are negative.    Physical Exam Updated Vital Signs BP 154/65   Pulse (!) 56   Temp 97.8 F (36.6 C) (Oral)   Resp 17   Ht 5' (1.524 m)   Wt 61.2 kg   SpO2 99%   BMI 26.37 kg/m   Physical Exam  Constitutional: She is oriented to person, place, and time. She appears well-developed and well-nourished. No distress.  HENT:  Head: Normocephalic and atraumatic.  Mouth/Throat: No oropharyngeal exudate.  Eyes: Conjunctivae and EOM are normal. Pupils are equal, round, and reactive to light. Right eye exhibits no discharge. Left eye exhibits no discharge. No scleral icterus.  Neck: Normal range of motion.  Cardiovascular: Normal rate, regular rhythm, normal heart sounds and intact distal pulses.  Exam reveals no gallop and no friction rub.   No murmur heard. Pulmonary/Chest: Effort normal and breath sounds normal. No respiratory distress. She has no wheezes. She has no rales. She exhibits no tenderness.  Abdominal: Soft. She exhibits no distension. There is no tenderness. There is no guarding.  Musculoskeletal: Normal range of motion. She exhibits no edema.  No midline cervical spinal tenderness. No step offs or obvious bony deformities.   Ecchymosis over right shoulder and clavicle. Pain with abduction of R shoulder. No skin tenting or obvious bony deformity.   Neurological: She is alert and oriented to person, place, and time.  Skin: Skin is warm and dry. No rash noted. She is not diaphoretic. No erythema. No pallor.  Psychiatric: She has a normal mood and affect. Her behavior is normal.  Nursing note and vitals  reviewed.    ED Treatments / Results  Labs (all labs ordered are listed, but only abnormal results are displayed) Labs Reviewed - No data to display  EKG  EKG Interpretation None       Radiology Dg Clavicle Right  Result Date: 01/18/2016 CLINICAL DATA:  Fall . EXAM: RIGHT CLAVICLE - 2+ VIEWS COMPARISON:  No recent. FINDINGS: Comminuted, displaced, angulated fracture of the distal clavicle is noted. Degenerative change right shoulder. No other focal abnormalities identified. Prior CABG . IMPRESSION: Comminuted, displaced, angulated fracture of the distal clavicle. Electronically Signed   By: [place]   On: 01/18/2016 09:16   Dg Shoulder  Right  Result Date: 01/18/2016 CLINICAL DATA:  Fall. EXAM: RIGHT SHOULDER - 2+ VIEW COMPARISON:  No recent prior. FINDINGS: Comminuted fracture of the distal clavicle is noted. Fracture is displaced and overriding. Acromioclavicular glenohumeral degenerative change. Prior CABG. IMPRESSION: Comminuted fracture of the distal clavicle with displaced and overriding fracture fragments. Electronically Signed   By: Marcello Moores  Register   On: 01/18/2016 09:15   Ct Head Wo Contrast  Result Date: 01/18/2016 CLINICAL DATA:  Patient status post fall getting out of recliner. No reported loss of consciousness. EXAM: CT HEAD WITHOUT CONTRAST CT CERVICAL SPINE WITHOUT CONTRAST TECHNIQUE: Multidetector CT imaging of the head and cervical spine was performed following the standard protocol without intravenous contrast. Multiplanar CT image reconstructions of the cervical spine were also generated. COMPARISON:  MRI brain 05/15/2010; CT head 10/21/2006. FINDINGS: CT HEAD FINDINGS Brain: Ventricles and sulci are appropriate for patient's age. Periventricular and subcortical white matter hypodensity compatible with chronic microvascular ischemic changes. No evidence for acute cortically based infarct, intracranial hemorrhage, mass lesion or mass-effect. Vascular: Carotid  arterial vascular calcifications. Skull: No evidence for displaced skull fracture. Sinuses/Orbits: Paranasal sinuses and mastoid air cells are well aerated. Other: None. CT CERVICAL SPINE FINDINGS Alignment: Normal anatomic alignment. Skull base and vertebrae: No acute fracture. No primary bone lesion or focal pathologic process. Soft tissues and spinal canal: No prevertebral fluid or swelling. No visible canal hematoma. Disc levels: Multilevel degenerative disc disease most pronounced C5-6 and C6-7. Multilevel facet degenerative changes. No evidence for acute fracture. Upper chest: Lung apices are clear. Incompletely visualized displaced right mid clavicle fracture. Other: None. IMPRESSION: Incompletely visualized displaced right mid clavicle fracture. No acute intracranial process. No acute cervical spine fracture. Electronically Signed   By: Lovey Newcomer M.D.   On: 01/18/2016 10:02   Ct Cervical Spine Wo Contrast  Result Date: 01/18/2016 CLINICAL DATA:  Patient status post fall getting out of recliner. No reported loss of consciousness. EXAM: CT HEAD WITHOUT CONTRAST CT CERVICAL SPINE WITHOUT CONTRAST TECHNIQUE: Multidetector CT imaging of the head and cervical spine was performed following the standard protocol without intravenous contrast. Multiplanar CT image reconstructions of the cervical spine were also generated. COMPARISON:  MRI brain 05/15/2010; CT head 10/21/2006. FINDINGS: CT HEAD FINDINGS Brain: Ventricles and sulci are appropriate for patient's age. Periventricular and subcortical white matter hypodensity compatible with chronic microvascular ischemic changes. No evidence for acute cortically based infarct, intracranial hemorrhage, mass lesion or mass-effect. Vascular: Carotid arterial vascular calcifications. Skull: No evidence for displaced skull fracture. Sinuses/Orbits: Paranasal sinuses and mastoid air cells are well aerated. Other: None. CT CERVICAL SPINE FINDINGS Alignment: Normal anatomic  alignment. Skull base and vertebrae: No acute fracture. No primary bone lesion or focal pathologic process. Soft tissues and spinal canal: No prevertebral fluid or swelling. No visible canal hematoma. Disc levels: Multilevel degenerative disc disease most pronounced C5-6 and C6-7. Multilevel facet degenerative changes. No evidence for acute fracture. Upper chest: Lung apices are clear. Incompletely visualized displaced right mid clavicle fracture. Other: None. IMPRESSION: Incompletely visualized displaced right mid clavicle fracture. No acute intracranial process. No acute cervical spine fracture. Electronically Signed   By: Lovey Newcomer M.D.   On: 01/18/2016 10:02    Procedures Procedures (including critical care time)  Medications Ordered in ED Medications  ibuprofen (ADVIL,MOTRIN) tablet 600 mg (600 mg Oral Given 01/18/16 1027)     Initial Impression / Assessment and Plan / ED Course  I have reviewed the triage vital signs and the nursing notes.  Pertinent labs & imaging results that were available during my care of the patient were reviewed by me and considered in my medical decision making (see chart for details).  Clinical Course    82 y.o F presents to the Ed today to be evaluated after a mechanical fall onto R shoulder. CT head and C spine obtained as there was questionall head injury, pt is on Plavix. Pt is alert and oriented x 4 in ED. No neuro deficits. CT head and C-spine negative for acute injury. Xray of R should and clavicle reveals comminuted, displaced, angulated fracture of distal clavicle. Pt given arm sling. Pain managed with ibuprofen. Spoke with Dr. Ninfa Linden with orthopedic surgery who states that he will follow up with pt in his office in the next 1-2 weeks. No additional recommendations at this time. Return precautions outlined in patient discharge instructions.    Patient was discussed with and seen by Dr. Lacinda Axon who agrees with the treatment plan.    Final Clinical  Impressions(s) / ED Diagnoses   Final diagnoses:  Closed displaced fracture of acromial end of right clavicle, initial encounter    New Prescriptions New Prescriptions   No medications on file     Carlos Levering, PA-C 01/18/16 [place], MD 01/20/16 1039

## 2016-01-28 ENCOUNTER — Ambulatory Visit (INDEPENDENT_AMBULATORY_CARE_PROVIDER_SITE_OTHER): Payer: Medicare Other | Admitting: Physician Assistant

## 2016-01-28 DIAGNOSIS — S42021A Displaced fracture of shaft of right clavicle, initial encounter for closed fracture: Secondary | ICD-10-CM

## 2016-01-28 DIAGNOSIS — I2510 Atherosclerotic heart disease of native coronary artery without angina pectoris: Secondary | ICD-10-CM

## 2016-01-28 NOTE — Progress Notes (Addendum)
Office Visit Note   Patient: Audrey [name]           Date of Birth: [birth year]           MRN: [id number] Visit Date: 01/28/2016              Requested by: Burnis Medin, MD [id number] [place], [place] [address] PCP: Lottie Dawson, MD   Assessment & Plan: Visit Diagnoses: No diagnosis found.  Plan: Gentle range of motion of the elbow, hand and wrist all encouraged. Sling for comfort and nighttime. Her back in 2 weeks and get 2 views of the right clavicle.  Follow-Up Instructions: Return in about 2 weeks (around 02/11/2016) for Radiographs.   Orders:  No orders of the defined types were placed in this encounter.  No orders of the defined types were placed in this encounter.     Procedures: No procedures performed   Clinical Data: No additional findings.   Subjective: Chief Complaint  Patient presents with  . Right Shoulder - Pain, Injury, Fracture    Right clavicle fracture 01/18/16 after fall in living room floor. Ambulates with sling. A lot of bruising around clavicle area. States she's in quite a bit of pain. She is only taking ibuprofen for pain. She denies any other injury at that time the fall. Radiographs are reviewed from 01/18/2016 and show a comminuted displaced distal clavicle shaft fracture   Injury     Review of Systems   Objective: Vital Signs: There were no vitals taken for this visit.  Physical Exam  Constitutional: She is oriented to person, place, and time. She appears well-developed and well-nourished.  Eyes: EOM are normal.  Pulmonary/Chest: Effort normal.  Neurological: She is alert and oriented to person, place, and time.  Skin: Skin is warm and dry.  Psychiatric: She has a normal mood and affect.    Right Shoulder Exam   Tenderness  The patient is experiencing tenderness in the clavicle.  Comments:  Right clavicle area with ecchymosis. Tenting of the skin. Full supination and pronation the right forearm. Radial  pulse intact. Motor and sensation right hand intact.       Specialty Comments:  No specialty comments available.  Imaging: No results found.   PMFS History: Patient Active Problem List   Diagnosis Date Noted  . Medicare annual wellness visit, subsequent 04/04/2013  . Spinal stenosis, lumbar region, with neurogenic claudication 04/04/2013  . Osteoporosis 04/02/2012  . Wears hearing aid 04/02/2012  . Osteoporosis by dexa forearm 07/27/2011  . CAD (coronary artery disease)   . AAA (abdominal aortic aneurysm) ([place])   . Bilateral carotid artery stenosis   . History of colon cancer   . OA (osteoarthritis)   . Hyperlipidemia   . HTN (hypertension)   . Back pain 04/01/2011  . Left leg pain 04/01/2011  . CAD (coronary artery disease), native coronary artery 11/11/2010  . CAROTID ARTERY DISEASE 10/26/2008  . ABDOMINAL AORTIC ANEURYSM 10/26/2008  . SEBACEOUS CYST 08/11/2007  . HYPERLIPIDEMIA NEC/NOS 12/22/2006  . HYPERTENSION, BENIGN ESSENTIAL 12/22/2006  . OSTEOARTHROSIS NOS, UNSPECIFIED SITE 12/22/2006   Past Medical History:  Diagnosis Date  . AAA (abdominal aortic aneurysm) ([facility])    small  . Bilateral carotid artery stenosis   . CAD (coronary artery disease)    including LM disease; s/p CABG  . History of chickenpox   . History of colon cancer    s/p resection in 2004  . History of colonoscopy   .  HTN (hypertension)   . Hyperlipidemia   . OA (osteoarthritis)    severe; s/p bilateral hip replacement    Family History  Problem Relation Age of Onset  . Cancer Mother     intra-abdominal/gallbladder  . Cholelithiasis Mother   . Heart attack Father     29  . Heart disease Father   . Diabetes Sister     52de  . Other Brother     GSW 42   . Colon cancer Neg Hx     Past Surgical History:  Procedure Laterality Date  . CORONARY ARTERY BYPASS GRAFT  2004  . left hip replacement  2007  . PARTIAL COLECTOMY  2004   sigmoid resection  . REFRACTIVE SURGERY  2013    had a film over eye removed.  . right hip replacement  2008   Social History   Occupational History  . Works at her family [employer]   Social History Main Topics  . Smoking status: Never Smoker  . Smokeless tobacco: Never Used  . Alcohol use No  . Drug use: No  . Sexual activity: Not on file

## 2016-02-05 ENCOUNTER — Telehealth: Payer: Self-pay | Admitting: Internal Medicine

## 2016-02-05 ENCOUNTER — Ambulatory Visit (INDEPENDENT_AMBULATORY_CARE_PROVIDER_SITE_OTHER): Payer: Medicare Other | Admitting: Internal Medicine

## 2016-02-05 ENCOUNTER — Encounter: Payer: Self-pay | Admitting: Internal Medicine

## 2016-02-05 VITALS — BP 128/70 | Temp 97.7°F | Ht 60.0 in | Wt 133.0 lb

## 2016-02-05 DIAGNOSIS — S42001S Fracture of unspecified part of right clavicle, sequela: Secondary | ICD-10-CM | POA: Diagnosis not present

## 2016-02-05 DIAGNOSIS — Z9181 History of falling: Secondary | ICD-10-CM | POA: Diagnosis not present

## 2016-02-05 DIAGNOSIS — L89151 Pressure ulcer of sacral region, stage 1: Secondary | ICD-10-CM | POA: Diagnosis not present

## 2016-02-05 DIAGNOSIS — I2510 Atherosclerotic heart disease of native coronary artery without angina pectoris: Secondary | ICD-10-CM

## 2016-02-05 NOTE — Telephone Encounter (Signed)
Patient Name: Audrey [name] DOB: 09-Jun-1926 Initial Comment Caller states mother fell and broke collarbone, has been in recliner, now has pressure on coccyx. Nurse Assessment Nurse: Ronnald Ramp, RN, Miranda Date/Time (Eastern Time): 02/05/2016 1:10:18 PM Confirm and document reason for call. If symptomatic, describe symptoms. You must click the next button to save text entered. ---Caller states her mother has a pressure sore at her coccyx. The area is red and rough. Denies any drainage. Has the patient traveled out of the country within the last 30 days? ---Not Applicable Does the patient have any new or worsening symptoms? ---Yes Will a triage be completed? ---Yes Related visit to physician within the last 2 weeks? ---No Does the PT have any chronic conditions? (i.e. diabetes, asthma, etc.) ---Yes List chronic conditions. ---HTN, Plavix Is this a behavioral health or substance abuse call? ---No Guidelines Guideline Title Affirmed Question Affirmed Notes Sores [1] Small red streak or spreading redness (<2 inches; 5 cm) AND [2] no fever Final Disposition User See Physician within 24 Hours Ronnald Ramp, RN, Miranda Comments Appt scheduled for today at 4pm with Dr. Regis Bill Referrals REFERRED TO PCP OFFICE Disagree/Comply: Comply

## 2016-02-05 NOTE — Patient Instructions (Addendum)
I don't see an ulcer but agree that the redness is related to pressure and moisture. Keep this dry gentle very gentle cleansing. Non-irritating moisturizer Use a non-soap for cleansing. Can try something like amerigel which is over-the-counter or similar. But his is usually used for ulcers   othewise can use topical antibiotic and or  Diaper rash tyoe creamsointment to avoid alcohol base want to avoid   Becomes ulcerative we need to get wound care involved..  Please check area  Every day  .

## 2016-02-05 NOTE — Progress Notes (Signed)
Chief Complaint  Patient presents with  . Wound Check    Daughter believes pt has pressure ulcer on buttocks.    HPI: Audrey Leoni Lafalce 82 y.o.   Comes in today with her daughter. 3 weeks ago she had a fall in her household and had a comminuted fracture of her right clavicle. She was seen in the emergency room she was sent home and is basically has been in recliner chair for 3 weeks until recently when she is able to get back into her bed. She noted some stinging sensation in her backside a few weeks ago that is getting worse and her daughter looked at the area and felt she needed to be seen. She uses [place] soap and sitting a lot but trying to get up and around now. No special treatment otherwise. ROS: See pertinent positives and negatives per HPI.  Past Medical History:  Diagnosis Date  . AAA (abdominal aortic aneurysm) ([facility])    small  . Bilateral carotid artery stenosis   . CAD (coronary artery disease)    including LM disease; s/p CABG  . History of chickenpox   . History of colon cancer    s/p resection in 2004  . History of colonoscopy   . HTN (hypertension)   . Hyperlipidemia   . OA (osteoarthritis)    severe; s/p bilateral hip replacement    Family History  Problem Relation Age of Onset  . Cancer Mother     intra-abdominal/gallbladder  . Cholelithiasis Mother   . Heart attack Father     72  . Heart disease Father   . Diabetes Sister     45de  . Other Brother     GSW 25   . Colon cancer Neg Hx     Social History   Social History  . Marital status: Widowed    Spouse name: N/A  . Number of children: 3  . Years of education: N/A   Occupational History  . Works at her family [employer]   Social History Main Topics  . Smoking status: Never Smoker  . Smokeless tobacco: Never Used  . Alcohol use No  . Drug use: No  . Sexual activity: Not Asked   Other Topics Concern  . 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      g3p3  Children are well   Lives independently by herself no pets family daughter  lives close by 8 hours of sleep at least    Negative TAD   14 years of education      Hearing aid and dentures   No falls .  Has smoke detector and wears seat belts. . No excess sun exposure. . No depression             Outpatient Medications Prior to Visit  Medication Sig Dispense Refill  . Ascorbic Acid (VITAMIN C) 1000 MG tablet Take 1,000 mg by mouth daily.    Marland Kitchen aspirin (ADULT ASPIRIN EC LOW STRENGTH) 81 MG EC tablet Take 81 mg by mouth daily.      . Cholecalciferol (VITAMIN D3) 5000 units CAPS Take 1 capsule by mouth daily.    . clopidogrel (PLAVIX) 75 MG tablet Take 1 tablet (75 mg total) by mouth daily. 90 tablet 3  . ezetimibe (ZETIA) 10 MG tablet Take 1 tablet (10 mg total) by  mouth daily. 90 tablet 3  . ibuprofen (ADVIL,MOTRIN) 200 MG tablet Take 400 mg by mouth every 6 (six) hours as needed for mild pain.    . metoprolol succinate (TOPROL-XL) 50 MG 24 hr tablet Take 1 tablet (50 mg total) by mouth daily. 90 tablet 3  . Multiple Vitamins-Minerals (ALIVE WOMENS 50+ PO) Take 1 tablet by mouth daily.    . rosuvastatin (CRESTOR) 40 MG tablet Take 1 tablet (40 mg total) by mouth daily. (Patient taking differently: Take 40 mg by mouth every evening. ) 90 tablet 3  . telmisartan (MICARDIS) 20 MG tablet Take 1 tablet (20 mg total) by mouth daily. 90 tablet 3   No facility-administered medications prior to visit.      EXAM:  BP 128/70 (BP Location: Left Arm, Patient Position: Sitting, Cuff Size: Normal)   Temp 97.7 F (36.5 C) (Oral)   Ht 5' (1.524 m)   Wt 133 lb (60.3 kg)   BMI 25.97 kg/m   Body mass index is 25.97 kg/m.  GENERAL: vitals reviewed and listed above, alert, oriented, appears well hydrated and in no acute distress HEENT: atraumatic, conjunctiva  clear, no obvious abnormalities  on inspection of external nose and ears CV: HRRR, no clubbing cyanosis or  peripheral edema nl cap refill  Favors right arm but mobile. Ambulatory independent. Examination of the right sacral coccyx area shows diffuse erythema no obvious ulcer or purple darkening of the skin. But it entails a large area there is no abscess obvious. PSYCH: pleasant and cooperative, no obvious depression or anxiety  ASSESSMENT AND PLAN:  Discussed the following assessment and plan:  pressure injury  of sacral coccyxgeal  region, stage 1?   - faily large area  involved   Hx of fall  Closed displaced fracture of right clavicle, unspecified part of clavicle, sequela No obvious cellulitis secondary infection although large area of redness is involved. Discussed options local care avoiding unnecessary moisture and pressure. Now that she is able to sleep in her bed this should be helpful but would  Do home health referral for wound management as she cannot  See or easily address this area Fu  If not getting better  -Patient advised to return or notify health care team  if symptoms worsen ,persist or new concerns arise.  Patient Instructions  I don't see an ulcer but agree that the redness is related to pressure and moisture. Keep this dry gentle very gentle cleansing. Non-irritating moisturizer Use a non-soap for cleansing. Can try something like amerigel which is over-the-counter or similar. But his is usually used for ulcers   othewise can use topical antibiotic and or  Diaper rash tyoe creamsointment to avoid alcohol base want to avoid   Becomes ulcerative we need to get wound care involved..  Please check area  Every day  .    Standley Brooking. Izeah Vossler M.D.

## 2016-02-05 NOTE — Progress Notes (Signed)
Pre visit review using our clinic review tool, if applicable. No additional management support is needed unless otherwise documented below in the visit note. 

## 2016-02-05 NOTE — Telephone Encounter (Signed)
Appt with Dr Mamie Nick @ 4pm today.

## 2016-02-06 NOTE — Addendum Note (Signed)
Addended by: Miles Costain T on: 02/06/2016 04:45 PM   Modules accepted: Orders

## 2016-02-08 DIAGNOSIS — Z85030 Personal history of malignant carcinoid tumor of large intestine: Secondary | ICD-10-CM

## 2016-02-08 DIAGNOSIS — I714 Abdominal aortic aneurysm, without rupture: Secondary | ICD-10-CM

## 2016-02-08 DIAGNOSIS — Z9049 Acquired absence of other specified parts of digestive tract: Secondary | ICD-10-CM

## 2016-02-08 DIAGNOSIS — Z951 Presence of aortocoronary bypass graft: Secondary | ICD-10-CM

## 2016-02-08 DIAGNOSIS — Z96643 Presence of artificial hip joint, bilateral: Secondary | ICD-10-CM

## 2016-02-08 DIAGNOSIS — L89151 Pressure ulcer of sacral region, stage 1: Secondary | ICD-10-CM | POA: Diagnosis not present

## 2016-02-08 DIAGNOSIS — Z9181 History of falling: Secondary | ICD-10-CM

## 2016-02-08 DIAGNOSIS — M48062 Spinal stenosis, lumbar region with neurogenic claudication: Secondary | ICD-10-CM

## 2016-02-08 DIAGNOSIS — I10 Essential (primary) hypertension: Secondary | ICD-10-CM | POA: Diagnosis not present

## 2016-02-08 DIAGNOSIS — Z7902 Long term (current) use of antithrombotics/antiplatelets: Secondary | ICD-10-CM

## 2016-02-08 DIAGNOSIS — I2510 Atherosclerotic heart disease of native coronary artery without angina pectoris: Secondary | ICD-10-CM | POA: Diagnosis not present

## 2016-02-08 DIAGNOSIS — S42021D Displaced fracture of shaft of right clavicle, subsequent encounter for fracture with routine healing: Secondary | ICD-10-CM | POA: Diagnosis not present

## 2016-02-11 ENCOUNTER — Ambulatory Visit (INDEPENDENT_AMBULATORY_CARE_PROVIDER_SITE_OTHER): Payer: Medicare Other

## 2016-02-11 ENCOUNTER — Encounter (INDEPENDENT_AMBULATORY_CARE_PROVIDER_SITE_OTHER): Payer: Self-pay | Admitting: Physician Assistant

## 2016-02-11 ENCOUNTER — Ambulatory Visit (INDEPENDENT_AMBULATORY_CARE_PROVIDER_SITE_OTHER): Payer: Medicare Other | Admitting: Physician Assistant

## 2016-02-11 DIAGNOSIS — S42024D Nondisplaced fracture of shaft of right clavicle, subsequent encounter for fracture with routine healing: Secondary | ICD-10-CM

## 2016-02-11 NOTE — Progress Notes (Signed)
Office Visit Note   Patient: Audrey [name]           Date of Birth: 06-Mar-1927           MRN: [id number] Visit Date: 02/11/2016              Requested by: Burnis Medin, MD [place], [place] [address] PCP: Lottie Dawson, MD   Assessment & Plan: Visit Diagnoses:  1. Closed nondisplaced fracture of shaft of right clavicle with routine healing, subsequent encounter     Plan: Activities as tolerated right shoulder no heavy lifting. 2 views of the right clavicle at follow-up  Follow-Up Instructions: Return in about 6 weeks (around 03/24/2016).   Orders:  Orders Placed This Encounter  Procedures  . XR Clavicle Right   No orders of the defined types were placed in this encounter.     Procedures: No procedures performed   Clinical Data: No additional findings.   Subjective: Chief Complaint  Patient presents with  . Right Shoulder - Fracture    Clavicle fracture on 01/18/16    Audrey [name] is here to follow up on her clavicle fracture from 01/18/16.  She is doing well and is without any problems today. She states that the pain is subsiding in her right shoulder. She also notes that her range of motion and right shoulders improving.    Review of Systems  See HPI  Objective: Vital Signs: There were no vitals taken for this visit.  Physical Exam  Ortho Exam Right shoulder no tenting of the skin she still has significant ecchymosis over the clavicle region. She has full forward flexion of the shoulder, no pain with cross over test and minimal tenderness with palpation over the clavicle shaft. Specialty Comments:  No specialty comments available.  Imaging: Xr Clavicle Right  Result Date: 02/11/2016 2 views right clavicle: Early consolidation of the clavicle fracture. Change in overall position alignment of the comminuted mid clavicle fracture.    PMFS History: Patient Active Problem List   Diagnosis Date Noted  . Medicare annual wellness  visit, subsequent 04/04/2013  . Spinal stenosis, lumbar region, with neurogenic claudication 04/04/2013  . Osteoporosis 04/02/2012  . Wears hearing aid 04/02/2012  . Osteoporosis by dexa forearm 07/27/2011  . CAD (coronary artery disease)   . AAA (abdominal aortic aneurysm) ([place])   . Bilateral carotid artery stenosis   . History of colon cancer   . OA (osteoarthritis)   . Hyperlipidemia   . HTN (hypertension)   . Back pain 04/01/2011  . Left leg pain 04/01/2011  . CAD (coronary artery disease), native coronary artery 11/11/2010  . CAROTID ARTERY DISEASE 10/26/2008  . ABDOMINAL AORTIC ANEURYSM 10/26/2008  . SEBACEOUS CYST 08/11/2007  . HYPERLIPIDEMIA NEC/NOS 12/22/2006  . HYPERTENSION, BENIGN ESSENTIAL 12/22/2006  . OSTEOARTHROSIS NOS, UNSPECIFIED SITE 12/22/2006   Past Medical History:  Diagnosis Date  . AAA (abdominal aortic aneurysm) ([facility])    small  . Bilateral carotid artery stenosis   . CAD (coronary artery disease)    including LM disease; s/p CABG  . History of chickenpox   . History of colon cancer    s/p resection in 2004  . History of colonoscopy   . HTN (hypertension)   . Hyperlipidemia   . OA (osteoarthritis)    severe; s/p bilateral hip replacement    Family History  Problem Relation Age of Onset  . Cancer Mother     intra-abdominal/gallbladder  . Cholelithiasis Mother   .  Heart attack Father     27  . Heart disease Father   . Diabetes Sister     61de  . Other Brother     GSW 58   . Colon cancer Neg Hx     Past Surgical History:  Procedure Laterality Date  . CORONARY ARTERY BYPASS GRAFT  2004  . left hip replacement  2007  . PARTIAL COLECTOMY  2004   sigmoid resection  . REFRACTIVE SURGERY  2013   had a film over eye removed.  . right hip replacement  2008   Social History   Occupational History  . Works at her family [employer]   Social History Main Topics  . Smoking status: Never Smoker  . Smokeless tobacco: Never Used   . Alcohol use No  . Drug use: No  . Sexual activity: Not on file

## 2016-02-19 NOTE — Progress Notes (Signed)
Pre visit review using our clinic review tool, if applicable. No additional management support is needed unless otherwise documented below in the visit note.  Chief Complaint  Patient presents with  . Follow-up    Has fell twice in the past two weeks.  Hit her head on the first fall.  Hallucinations started after first fall.  First fall believed to be around the first of Oct.    HPI: Audrey [name] 82 y.o. comes in today with son [name] and daughter-in-law because of concerns about visual hallucinations that she has had over the last month. She still lives by herself home health is coming her backside is getting better. She has had 2 falls in the last couple months one was standing up from sitting felt dizzy fell and broke her clavicle and went to the emergency room. The other one was related to steps and tripping no loss of consciousness headaches or acute neurologic symptoms with this. She is now noted to be "Seeing things "   Vivid.   Husband deceased talkignt to he and  Son   And dead cat  in last few months.  This lady in the white dressing at the end of the driveway. She has actually taken action when seen what she thought was her son laying in a recliner realized he wasn't there Denies major memory problems according to her family know auditory hallucinations depression headaches had some numbness on her palms but otherwise no new neurologic symptoms does have a walker at home.   Not scary to her  But goes over and not there.   No new medications although she lives by herself has many visitors caretakers and help with her medications reviewed and no new medicines.  ROS: See pertinent positives and negatives per HPI. No fever headache sore on the backside is getting better. Says she sees double at times but does have decreased vision.  Past Medical History:  Diagnosis Date  . AAA (abdominal aortic aneurysm) ([facility])    small  . Bilateral carotid artery stenosis   . CAD (coronary  artery disease)    including LM disease; s/p CABG  . History of chickenpox   . History of colon cancer    s/p resection in 2004  . History of colonoscopy   . HTN (hypertension)   . Hyperlipidemia   . OA (osteoarthritis)    severe; s/p bilateral hip replacement    Family History  Problem Relation Age of Onset  . Cancer Mother     intra-abdominal/gallbladder  . Cholelithiasis Mother   . Heart attack Father     32  . Heart disease Father   . Diabetes Sister     61de  . Other Brother     GSW 35   . Colon cancer Neg Hx     Social History   Social History  . Marital status: Widowed    Spouse name: N/A  . Number of children: 3  . Years of education: N/A   Occupational History  . Works at her family [employer]   Social History Main Topics  . Smoking status: Never Smoker  . Smokeless tobacco: Never Used  . Alcohol use No  . Drug use: No  . Sexual activity: Not Asked   Other Topics Concern  . 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  Has smoke detector and wears seat belts. . No excess sun exposure. . No depression             Outpatient Medications Prior to Visit  Medication Sig Dispense Refill  . aspirin (ADULT ASPIRIN EC LOW STRENGTH) 81 MG EC tablet Take 81 mg by mouth daily.      . clopidogrel (PLAVIX) 75 MG tablet Take 1 tablet (75 mg total) by mouth daily. 90 tablet 3  . ezetimibe (ZETIA) 10 MG tablet Take 1 tablet (10 mg total) by mouth daily. 90 tablet 3  . ibuprofen (ADVIL,MOTRIN) 200 MG tablet Take 400 mg by mouth every 6 (six) hours as needed for mild pain.    . metoprolol succinate (TOPROL-XL) 50 MG 24  hr tablet Take 1 tablet (50 mg total) by mouth daily. 90 tablet 3  . Multiple Vitamins-Minerals (ALIVE WOMENS 50+ PO) Take 1 tablet by mouth daily.    . rosuvastatin (CRESTOR) 40 MG tablet Take 1 tablet (40 mg total) by mouth daily. (Patient taking differently: Take 40 mg by mouth every evening. ) 90 tablet 3  . telmisartan (MICARDIS) 20 MG tablet Take 1 tablet (20 mg total) by mouth daily. 90 tablet 3  . Ascorbic Acid (VITAMIN C) 1000 MG tablet Take 1,000 mg by mouth daily.    . Cholecalciferol (VITAMIN D3) 5000 units CAPS Take 1 capsule by mouth daily.     No facility-administered medications prior to visit.      EXAM:  BP (!) 154/74 (BP Location: Right Arm, Patient Position: Sitting, Cuff Size: Normal)   Temp 97.6 F (36.4 C) (Oral)   Ht 5' (1.524 m)   Wt 131 lb 12.8 oz (59.8 kg)   BMI 25.74 kg/m   Body mass index is 25.74 kg/m.  GENERAL: vitals reviewed and listed above, alert, oriented, appears well hydrated and in no acute distress seems oreitned and nl sppech to time person place  No tremor  HEENT: atraumatic, conjunctiva  clear, no obvious abnormalities on inspection of external nose and ears OP : no lesion edema or exudate tongue is midline EOMs appear full. NECK: no obvious masses on inspection palpation  LUNGS: clear to auscultation bilaterally, no wheezes, rales or rhonchi,  CV: HRRR, no clubbing cyanosis or  nl cap refill  MS: moves all extremities without noticeable focal  Abnormality OA changes  Gait  Leans a bit to the right and short stepped gait but no rigidity or cogwheeling?  PSYCH: pleasant and cooperative, no obvious depression or anxiety Speech appears normal as well as conversation routine. Lab Results  Component Value Date   WBC 5.7 10/03/2015   HGB 13.1 10/03/2015   HCT 38.0 10/03/2015   PLT 212.0 10/03/2015   GLUCOSE 98 10/31/2015   CHOL 114 10/03/2015   TRIG 142.0 10/03/2015   HDL 44.80 10/03/2015   LDLDIRECT 89.5 08/06/2007   LDLCALC 41  10/03/2015   ALT 22 10/03/2015   AST 27 10/03/2015   NA 138 10/31/2015   K 5.1 10/31/2015   CL 105 10/31/2015   CREATININE 1.54 (H) 10/31/2015   BUN 27 (H) 10/31/2015   CO2 26 10/31/2015   TSH 3.87 10/03/2015    ASSESSMENT AND  PLAN:  Discussed the following assessment and plan:  Seeing things - Plan: Ambulatory referral to Neurology  Hx of fall - Plan: Ambulatory referral to Neurology  Closed displaced fracture of right clavicle, unspecified part of clavicle, sequela  Orthostatic lightheadedness   Underlying coronary disease on medication followed by cardiology no new change in medication no history of obvious brady  Total visit 66mins > 50% spent counseling and coordinating care as indicated in above note and in instructions to patient .   Discussed findings and next step. I don't see anything alarming on her exam today reviewed medicines doesn't appear to be side effects. No obvious depression anxiety. Plan neurology referral to contact son and patient for appointment. Laboratory testing reviewed that was done in the summer August in July. son [name] cell [phone]  -Patient advised to return or notify health care team  if symptoms worsen ,persist or new concerns arise.  Patient Instructions  Will be contacted about neurology consult about  The sx you are having   Seeing things. And  Some intermittent double vision.   Also   If getting recurrent dizziness   when  You arise .   Then we may get cardiology to check   Your medications .   There is a physical for balance .  But see neurology first about these sx.     Standley Brooking. Panosh M.D.

## 2016-02-21 ENCOUNTER — Encounter: Payer: Self-pay | Admitting: Internal Medicine

## 2016-02-21 ENCOUNTER — Ambulatory Visit (INDEPENDENT_AMBULATORY_CARE_PROVIDER_SITE_OTHER): Payer: Medicare Other | Admitting: Internal Medicine

## 2016-02-21 VITALS — BP 154/74 | Temp 97.6°F | Ht 60.0 in | Wt 131.8 lb

## 2016-02-21 DIAGNOSIS — R441 Visual hallucinations: Secondary | ICD-10-CM | POA: Diagnosis not present

## 2016-02-21 DIAGNOSIS — Z9181 History of falling: Secondary | ICD-10-CM | POA: Diagnosis not present

## 2016-02-21 DIAGNOSIS — S42001S Fracture of unspecified part of right clavicle, sequela: Secondary | ICD-10-CM

## 2016-02-21 DIAGNOSIS — I2510 Atherosclerotic heart disease of native coronary artery without angina pectoris: Secondary | ICD-10-CM | POA: Diagnosis not present

## 2016-02-21 DIAGNOSIS — R42 Dizziness and giddiness: Secondary | ICD-10-CM

## 2016-02-21 NOTE — Patient Instructions (Signed)
Will be contacted about neurology consult about  The sx you are having   Seeing things. And  Some intermittent double vision.   Also   If getting recurrent dizziness   when  You arise .   Then we may get cardiology to check   Your medications .   There is a physical for balance .  But see neurology first about these sx.
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
## 2016-03-18 ENCOUNTER — Encounter: Payer: Self-pay | Admitting: Internal Medicine

## 2016-03-18 ENCOUNTER — Ambulatory Visit (INDEPENDENT_AMBULATORY_CARE_PROVIDER_SITE_OTHER): Payer: Medicare Other | Admitting: Internal Medicine

## 2016-03-18 VITALS — BP 104/68 | HR 69 | Temp 97.4°F | Ht 60.0 in | Wt 134.1 lb

## 2016-03-18 DIAGNOSIS — R54 Age-related physical debility: Secondary | ICD-10-CM | POA: Diagnosis not present

## 2016-03-18 DIAGNOSIS — R350 Frequency of micturition: Secondary | ICD-10-CM

## 2016-03-18 DIAGNOSIS — I2510 Atherosclerotic heart disease of native coronary artery without angina pectoris: Secondary | ICD-10-CM | POA: Diagnosis not present

## 2016-03-18 DIAGNOSIS — N390 Urinary tract infection, site not specified: Secondary | ICD-10-CM | POA: Diagnosis not present

## 2016-03-18 DIAGNOSIS — R8281 Pyuria: Secondary | ICD-10-CM

## 2016-03-18 LAB — POC URINALSYSI DIPSTICK (AUTOMATED)
Bilirubin, UA: NEGATIVE
Blood, UA: NEGATIVE
Glucose, UA: NEGATIVE
KETONES UA: NEGATIVE
Nitrite, UA: NEGATIVE
PH UA: 6
SPEC GRAV UA: 1.015
UROBILINOGEN UA: 1

## 2016-03-18 MED ORDER — CEFUROXIME AXETIL 500 MG PO TABS: 500.0000 mg | ORAL_TABLET | Freq: Two times a day (BID) | ORAL | 0 refills | Status: DC

## 2016-03-18 NOTE — Patient Instructions (Signed)
Treating you for a urinary tract infection  Waiting for culture results to return  Will let you know results  If not getting better we may need to change medication. But avoid carbonated  caffeine beverages that irritate the bladder.     Urinary Tract Infection, Adult A urinary tract infection (UTI) is an infection of any part of the urinary tract, which includes the kidneys, ureters, bladder, and urethra. These organs make, store, and get rid of urine in the body. UTI can be a bladder infection (cystitis) or kidney infection (pyelonephritis). What are the causes? This infection may be caused by fungi, viruses, or bacteria. Bacteria are the most common cause of UTIs. This condition can also be caused by repeated incomplete emptying of the bladder during urination. What increases the risk? This condition is more likely to develop if:  You ignore your need to urinate or hold urine for long periods of time.  You do not empty your bladder completely during urination.  You wipe back to front after urinating or having a bowel movement, if you are female.  You are uncircumcised, if you are female.  You are constipated.  You have a urinary catheter that stays in place (indwelling).  You have a weak defense (immune) system.  You have a medical condition that affects your bowels, kidneys, or bladder.  You have diabetes.  You take antibiotic medicines frequently or for long periods of time, and the antibiotics no longer work well against certain types of infections (antibiotic resistance).  You take medicines that irritate your urinary tract.  You are exposed to chemicals that irritate your urinary tract.  You are female. What are the signs or symptoms? Symptoms of this condition include:  Fever.  Frequent urination or passing small amounts of urine frequently.  Needing to urinate urgently.  Pain or burning with urination.  Urine that smells bad or unusual.  Cloudy  urine.  Pain in the lower abdomen or back.  Trouble urinating.  Blood in the urine.  Vomiting or being less hungry than normal.  Diarrhea or abdominal pain.  Vaginal discharge, if you are female. How is this diagnosed? This condition is diagnosed with a medical history and physical exam. You will also need to provide a urine sample to test your urine. Other tests may be done, including:  Blood tests.  Sexually transmitted disease (STD) testing. If you have had more than one UTI, a cystoscopy or imaging studies may be done to determine the cause of the infections. How is this treated? Treatment for this condition often includes a combination of two or more of the following:  Antibiotic medicine.  Other medicines to treat less common causes of UTI.  Over-the-counter medicines to treat pain.  Drinking enough water to stay hydrated. Follow these instructions at home:  Take over-the-counter and prescription medicines only as told by your health care provider.  If you were prescribed an antibiotic, take it as told by your health care provider. Do not stop taking the antibiotic even if you start to feel better.  Avoid alcohol, caffeine, tea, and carbonated beverages. They can irritate your bladder.  Drink enough fluid to keep your urine clear or pale yellow.  Keep all follow-up visits as told by your health care provider. This is important.  Make sure to:  Empty your bladder often and completely. Do not hold urine for long periods of time.  Empty your bladder before and after sex.  Wipe from front to back after a  bowel movement if you are female. Use each tissue one time when you wipe. Contact a health care provider if:  You have back pain.  You have a fever.  You feel nauseous or vomit.  Your symptoms do not get better after 3 days.  Your symptoms go away and then return. Get help right away if:  You have severe back pain or lower abdominal pain.  You are  vomiting and cannot keep down any medicines or water. This information is not intended to replace advice given to you by your health care provider. Make sure you discuss any questions you have with your health care provider. Document Released: 12/18/2004 Document Revised: 08/22/2015 Document Reviewed: 01/29/2015 Elsevier Interactive Patient Education  2017 [employer].

## 2016-03-18 NOTE — Progress Notes (Signed)
Chief Complaint  Patient presents with  . Urinary Frequency    HPI: Audrey [name] 82 y.o. comesin for  Acute appt with family  His actually been having increasing urinary frequency for about a month including nocturia without burning fever or chills. Having some left flank pain at times when she moves around. Episodes of seeing things are still occurring has an appointment with neurology next week. Family was concerned she could have a UTI.  ROS: See pertinent positives and negatives per HPI. No fever or chills hematuria.  Past Medical History:  Diagnosis Date  . AAA (abdominal aortic aneurysm) (HCC)    small  . Bilateral carotid artery stenosis   . CAD (coronary artery disease)    including LM disease; s/p CABG  . History of chickenpox   . History of colon cancer    s/p resection in 2004  . History of colonoscopy   . HTN (hypertension)   . Hyperlipidemia   . OA (osteoarthritis)    severe; s/p bilateral hip replacement    Family History  Problem Relation Age of Onset  . Cancer Mother     intra-abdominal/gallbladder  . Cholelithiasis Mother   . Heart attack Father     62  . Heart disease Father   . Diabetes Sister     60de  . Other Brother     GSW 87   . Colon cancer Neg Hx     Social History   Social History  . Marital status: Widowed    Spouse name: N/A  . Number of children: 3  . Years of education: N/A   Occupational History  . Works at her family [employer]   Social History Main Topics  . Smoking status: Never Smoker  . Smokeless tobacco: Never Used  . Alcohol use No  . Drug use: No  . Sexual activity: Not on file   Other Topics Concern  . 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      g3p3  Children are well   Lives independently by herself no pets family daughter  lives close by 8 hours of sleep  at least    Negative TAD   14 years of education      Hearing aid and dentures   No falls .  Has smoke detector and wears seat belts. . No excess sun exposure. . No depression             Outpatient Medications Prior to Visit  Medication Sig Dispense Refill  . aspirin (ADULT ASPIRIN EC LOW STRENGTH) 81 MG EC tablet Take 81 mg by mouth daily.      . Cholecalciferol (VITAMIN D3) 2000 units TABS Take 2,000 Units by mouth 2 (two) times daily.    . clopidogrel (PLAVIX) 75 MG tablet Take 1 tablet (75 mg total) by mouth daily. 90 tablet 3  . ezetimibe (ZETIA) 10 MG tablet Take 1 tablet (10 mg total) by mouth daily. 90 tablet 3  . ibuprofen (ADVIL,MOTRIN) 200 MG tablet Take 400 mg by mouth every 6 (six) hours as needed for mild pain.    . metoprolol succinate (TOPROL-XL) 50 MG 24 hr tablet Take 1 tablet (50 mg total) by mouth daily. 90 tablet 3  . Multiple Vitamins-Minerals (ALIVE WOMENS 50+ PO) Take 1 tablet by mouth daily.    Marland Kitchen  rosuvastatin (CRESTOR) 40 MG tablet Take 1 tablet (40 mg total) by mouth daily. (Patient taking differently: Take 40 mg by mouth every evening. ) 90 tablet 3  . telmisartan (MICARDIS) 20 MG tablet Take 1 tablet (20 mg total) by mouth daily. 90 tablet 3  . vitamin C (ASCORBIC ACID) 500 MG tablet Take 500 mg by mouth daily.     No facility-administered medications prior to visit.      EXAM:  BP 104/68 (BP Location: Right Arm, Patient Position: Sitting, Cuff Size: Normal)   Pulse 69   Temp 97.4 F (36.3 C) (Oral)   Ht 5' (1.524 m)   Wt 134 lb 1.6 oz (60.8 kg)   SpO2 98%   BMI 26.19 kg/m   Body mass index is 26.19 kg/m.  GENERAL: vitals reviewed and listed above, alert,  appears well hydrated and in no acute distressNontoxic appearing HEENT: atraumatic, conjunctiva  clear, no obvious abnormalities on inspection of external nose and ears  CV: HRRR, no clubbing cyanosis or  peripheral edema nl cap refill  No discrete flank pain but points to left lower back  area the area of concern. MS: moves all extremities without noticeable focal  abnormality PSYCH: pleasant and cooperative,  ua pos 1+ leuk  ASSESSMENT AND PLAN:  Discussed the following assessment and plan:  Urinary frequency - Plan: POCT Urinalysis Dipstick (Automated), Urine culture  Pyuria  Advanced age With +1 leukocytes in the urine consideration of UTI as the cause. Discussed differential diagnosis risk-benefit of medicine. Begin Ceftin twice a day for 5 days culture pending. Keep appointment with neurologist. -Patient advised to return or notify health care team  if symptoms worsen ,persist or new concerns arise.  Patient Instructions  Treating you for a urinary tract infection  Waiting for culture results to return  Will let you know results  If not getting better we may need to change medication. But avoid carbonated  caffeine beverages that irritate the bladder.     Urinary Tract Infection, Adult A urinary tract infection (UTI) is an infection of any part of the urinary tract, which includes the kidneys, ureters, bladder, and urethra. These organs make, store, and get rid of urine in the body. UTI can be a bladder infection (cystitis) or kidney infection (pyelonephritis). What are the causes? This infection may be caused by fungi, viruses, or bacteria. Bacteria are the most common cause of UTIs. This condition can also be caused by repeated incomplete emptying of the bladder during urination. What increases the risk? This condition is more likely to develop if:  You ignore your need to urinate or hold urine for long periods of time.  You do not empty your bladder completely during urination.  You wipe back to front after urinating or having a bowel movement, if you are female.  You are uncircumcised, if you are female.  You are constipated.  You have a urinary catheter that stays in place (indwelling).  You have a weak defense (immune) system.  You have a medical  condition that affects your bowels, kidneys, or bladder.  You have diabetes.  You take antibiotic medicines frequently or for long periods of time, and the antibiotics no longer work well against certain types of infections (antibiotic resistance).  You take medicines that irritate your urinary tract.  You are exposed to chemicals that irritate your urinary tract.  You are female. What are the signs or symptoms? Symptoms of this condition include:  Fever.  Frequent urination or passing  small amounts of urine frequently.  Needing to urinate urgently.  Pain or burning with urination.  Urine that smells bad or unusual.  Cloudy urine.  Pain in the lower abdomen or back.  Trouble urinating.  Blood in the urine.  Vomiting or being less hungry than normal.  Diarrhea or abdominal pain.  Vaginal discharge, if you are female. How is this diagnosed? This condition is diagnosed with a medical history and physical exam. You will also need to provide a urine sample to test your urine. Other tests may be done, including:  Blood tests.  Sexually transmitted disease (STD) testing. If you have had more than one UTI, a cystoscopy or imaging studies may be done to determine the cause of the infections. How is this treated? Treatment for this condition often includes a combination of two or more of the following:  Antibiotic medicine.  Other medicines to treat less common causes of UTI.  Over-the-counter medicines to treat pain.  Drinking enough water to stay hydrated. Follow these instructions at home:  Take over-the-counter and prescription medicines only as told by your health care provider.  If you were prescribed an antibiotic, take it as told by your health care provider. Do not stop taking the antibiotic even if you start to feel better.  Avoid alcohol, caffeine, tea, and carbonated beverages. They can irritate your bladder.  Drink enough fluid to keep your urine clear  or pale yellow.  Keep all follow-up visits as told by your health care provider. This is important.  Make sure to:  Empty your bladder often and completely. Do not hold urine for long periods of time.  Empty your bladder before and after sex.  Wipe from front to back after a bowel movement if you are female. Use each tissue one time when you wipe. Contact a health care provider if:  You have back pain.  You have a fever.  You feel nauseous or vomit.  Your symptoms do not get better after 3 days.  Your symptoms go away and then return. Get help right away if:  You have severe back pain or lower abdominal pain.  You are vomiting and cannot keep down any medicines or water. This information is not intended to replace advice given to you by your health care provider. Make sure you discuss any questions you have with your health care provider. Document Released: 12/18/2004 Document Revised: 08/22/2015 Document Reviewed: 01/29/2015 Elsevier Interactive Patient Education  2017 [place]. Arnisha Laffoon M.D.

## 2016-03-20 LAB — URINE CULTURE

## 2016-03-20 NOTE — Progress Notes (Signed)
Tell patient that urine culture shows e coli  sensitive to medication given . Should resolve with current treatment .FU if not better. 

## 2016-03-25 ENCOUNTER — Other Ambulatory Visit: Payer: Medicare Other

## 2016-03-25 ENCOUNTER — Encounter: Payer: Self-pay | Admitting: Neurology

## 2016-03-25 ENCOUNTER — Ambulatory Visit (INDEPENDENT_AMBULATORY_CARE_PROVIDER_SITE_OTHER): Payer: Medicare Other | Admitting: Neurology

## 2016-03-25 VITALS — BP 130/70 | HR 66 | Ht 61.0 in | Wt 131.0 lb

## 2016-03-25 DIAGNOSIS — F0390 Unspecified dementia without behavioral disturbance: Secondary | ICD-10-CM | POA: Diagnosis not present

## 2016-03-25 DIAGNOSIS — N289 Disorder of kidney and ureter, unspecified: Secondary | ICD-10-CM | POA: Diagnosis not present

## 2016-03-25 DIAGNOSIS — R441 Visual hallucinations: Secondary | ICD-10-CM

## 2016-03-25 DIAGNOSIS — G609 Hereditary and idiopathic neuropathy, unspecified: Secondary | ICD-10-CM

## 2016-03-25 DIAGNOSIS — E538 Deficiency of other specified B group vitamins: Secondary | ICD-10-CM | POA: Diagnosis not present

## 2016-03-25 LAB — CBC WITH DIFFERENTIAL/PLATELET
BASOS PCT: 0 %
Basophils Absolute: 0 cells/uL (ref 0–200)
Eosinophils Absolute: 55 cells/uL (ref 15–500)
Eosinophils Relative: 1 %
HCT: 35.8 % (ref 35.0–45.0)
Hemoglobin: 11.5 g/dL — ABNORMAL LOW (ref 11.7–15.5)
LYMPHS PCT: 19 %
Lymphs Abs: 1045 cells/uL (ref 850–3900)
MCH: 29 pg (ref 27.0–33.0)
MCHC: 32.1 g/dL (ref 32.0–36.0)
MCV: 90.2 fL (ref 80.0–100.0)
MONOS PCT: 7 %
MPV: 8.9 fL (ref 7.5–12.5)
Monocytes Absolute: 385 cells/uL (ref 200–950)
Neutro Abs: 4015 cells/uL (ref 1500–7800)
Neutrophils Relative %: 73 %
PLATELETS: 176 10*3/uL (ref 140–400)
RBC: 3.97 MIL/uL (ref 3.80–5.10)
RDW: 15.2 % — AB (ref 11.0–15.0)
WBC: 5.5 10*3/uL (ref 3.8–10.8)

## 2016-03-25 MED ORDER — DONEPEZIL HCL 10 MG PO TABS: 10.0000 mg | ORAL_TABLET | Freq: Every day | ORAL | 2 refills | Status: DC

## 2016-03-25 MED ORDER — DONEPEZIL HCL 5 MG PO TABS: 5.0000 mg | ORAL_TABLET | Freq: Every day | ORAL | 0 refills | Status: DC

## 2016-03-25 NOTE — Patient Instructions (Signed)
1. Your provider has requested that you have labwork completed today. Please go to [facility] ([id number]) on the second floor of this building before leaving the office today. You do not need to check in. If you are not called within 15 minutes please check with the front desk.   2. Start Aricept 5 mg tablets. One tablet daily for one month, then switch to Aricept 10 mg tablets. Prescriptions sent to pharmacy.   3. Follow up in 3 months.

## 2016-03-26 ENCOUNTER — Encounter (INDEPENDENT_AMBULATORY_CARE_PROVIDER_SITE_OTHER): Payer: Self-pay | Admitting: Physician Assistant

## 2016-03-26 ENCOUNTER — Ambulatory Visit (INDEPENDENT_AMBULATORY_CARE_PROVIDER_SITE_OTHER): Payer: Medicare Other

## 2016-03-26 ENCOUNTER — Ambulatory Visit (INDEPENDENT_AMBULATORY_CARE_PROVIDER_SITE_OTHER): Payer: Medicare Other | Admitting: Physician Assistant

## 2016-03-26 DIAGNOSIS — M898X1 Other specified disorders of bone, shoulder: Secondary | ICD-10-CM

## 2016-03-26 LAB — COMPREHENSIVE METABOLIC PANEL
ALBUMIN: 4.4 g/dL (ref 3.6–5.1)
ALK PHOS: 85 U/L (ref 33–130)
ALT: 26 U/L (ref 6–29)
AST: 31 U/L (ref 10–35)
BILIRUBIN TOTAL: 1.1 mg/dL (ref 0.2–1.2)
BUN: 21 mg/dL (ref 7–25)
CALCIUM: 9.5 mg/dL (ref 8.6–10.4)
CO2: 25 mmol/L (ref 20–31)
Chloride: 100 mmol/L (ref 98–110)
Creat: 1.15 mg/dL — ABNORMAL HIGH (ref 0.60–0.88)
GLUCOSE: 126 mg/dL — AB (ref 65–99)
Potassium: 4.4 mmol/L (ref 3.5–5.3)
Sodium: 137 mmol/L (ref 135–146)
TOTAL PROTEIN: 6.5 g/dL (ref 6.1–8.1)

## 2016-03-26 LAB — FOLATE

## 2016-03-26 LAB — RPR

## 2016-03-26 LAB — VITAMIN B12: Vitamin B-12: 754 pg/mL (ref 200–1100)

## 2016-03-26 NOTE — Progress Notes (Signed)
Office Visit Note   Patient: Audrey [name]           Date of Birth: 05-05-1926           MRN: [id number] Visit Date: 03/26/2016              Requested by: Burnis Medin, MD [place], [place] [address] PCP: Lottie Dawson, MD   Assessment & Plan: Visit Diagnoses:  1. Pain of right clavicle     Plan: Continue to  work on range of motion the shoulder. Up when necessary there are any concerns Follow-Up Instructions: Return if symptoms worsen or fail to improve.   Orders:  Orders Placed This Encounter  Procedures  . XR Clavicle Right   No orders of the defined types were placed in this encounter.     Procedures: No procedures performed   Clinical Data: No additional findings.   Subjective: Chief Complaint  Patient presents with  . Right Shoulder - Follow-up  . Right Clavicle    HPI [name] returns today follow-up of her clavicle fracture she sustained on 01/18/2016. Patient is doing well no concerns to have no pain and shoulder. Review of Systems   Objective: Vital Signs: There were no vitals taken for this visit.  Physical Exam  Ortho Exam Full range of motion of the right shoulder without pain negative impingement testing. Negative crossover test right shoulder. No tenderness over the clavicle shaft with palpation. Palpable callus.  5 /5 strengths with external and internal rotation against resistance. Specialty Comments:  No specialty comments available.  Imaging: No results found.   PMFS History: Patient Active Problem List   Diagnosis Date Noted  . Medicare annual wellness visit, subsequent 04/04/2013  . Spinal stenosis, lumbar region, with neurogenic claudication 04/04/2013  . Osteoporosis 04/02/2012  . Wears hearing aid 04/02/2012  . Osteoporosis by dexa forearm 07/27/2011  . CAD (coronary artery disease)   . AAA (abdominal aortic aneurysm) ([place])   . Bilateral carotid artery stenosis   . History of colon cancer     . OA (osteoarthritis)   . Hyperlipidemia   . HTN (hypertension)   . Back pain 04/01/2011  . Left leg pain 04/01/2011  . CAD (coronary artery disease), native coronary artery 11/11/2010  . CAROTID ARTERY DISEASE 10/26/2008  . ABDOMINAL AORTIC ANEURYSM 10/26/2008  . SEBACEOUS CYST 08/11/2007  . HYPERLIPIDEMIA NEC/NOS 12/22/2006  . HYPERTENSION, BENIGN ESSENTIAL 12/22/2006  . OSTEOARTHROSIS NOS, UNSPECIFIED SITE 12/22/2006   Past Medical History:  Diagnosis Date  . AAA (abdominal aortic aneurysm) ([facility])    small  . Bilateral carotid artery stenosis   . CAD (coronary artery disease)    including LM disease; s/p CABG  . History of chickenpox   . History of colon cancer    s/p resection in 2004  . History of colonoscopy   . HTN (hypertension)   . Hyperlipidemia   . OA (osteoarthritis)    severe; s/p bilateral hip replacement    Family History  Problem Relation Age of Onset  . Cancer Mother     intra-abdominal/gallbladder  . Cholelithiasis Mother   . Heart attack Father     14  . Heart disease Father   . Diabetes Sister     101de  . Other Brother     GSW 47   . Heart attack Sister   . Colon cancer Neg Hx     Past Surgical History:  Procedure Laterality Date  . CATARACT  EXTRACTION, BILATERAL    . CORONARY ARTERY BYPASS GRAFT  2004  . left hip replacement  2007  . PARTIAL COLECTOMY  2004   sigmoid resection  . REFRACTIVE SURGERY  2013   had a film over eye removed.  . right hip replacement  2008   Social History   Occupational History  . 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 Smoking status: Never Smoker  . Smokeless tobacco: Never Used  . Alcohol use No  . Drug use: No  . Sexual activity: Not on file

## 2016-03-27 LAB — PROTEIN ELECTROPHORESIS, SERUM
ALBUMIN ELP: 4.1 g/dL (ref 3.8–4.8)
ALPHA-1-GLOBULIN: 0.4 g/dL — AB (ref 0.2–0.3)
ALPHA-2-GLOBULIN: 0.7 g/dL (ref 0.5–0.9)
Abnormal Protein Band1: 0.2 g/dL
BETA 2: 0.3 g/dL (ref 0.2–0.5)
Beta Globulin: 0.4 g/dL (ref 0.4–0.6)
Gamma Globulin: 0.6 g/dL — ABNORMAL LOW (ref 0.8–1.7)
TOTAL PROTEIN, SERUM ELECTROPHOR: 6.5 g/dL (ref 6.1–8.1)

## 2016-03-27 LAB — IMMUNOFIXATION ELECTROPHORESIS
IGA: 194 mg/dL (ref 81–463)
IgG (Immunoglobin G), Serum: 580 mg/dL — ABNORMAL LOW (ref 694–1618)
IgM, Serum: 49 mg/dL (ref 48–271)

## 2016-03-27 LAB — PROTEIN ELECTROPHORESIS,RANDOM URN
ALPHA-1-GLOBULIN, U: 45.7 %
Albumin: 17.7 %
Alpha-2-Globulin, U: 14 %
Beta Globulin, U: 16.3 %
CREATININE, URINE: 191 mg/dL (ref 20–320)
Gamma Globulin, U: 6.3 %
Protein Creatinine Ratio: 330 mg/g creat — ABNORMAL HIGH (ref 21–161)
TOTAL PROTEIN, URINE: 63 mg/dL — AB (ref 5–24)

## 2016-03-27 LAB — IMMUNOFIXATION INTE

## 2016-03-28 ENCOUNTER — Telehealth: Payer: Self-pay | Admitting: Neurology

## 2016-03-28 DIAGNOSIS — D472 Monoclonal gammopathy: Secondary | ICD-10-CM

## 2016-03-28 NOTE — Telephone Encounter (Signed)
-----   Message from [place], DO sent at 03/28/2016  7:34 AM EST ----- Let pt/son know that she has an abnormal protein spike in urine and serum that needs further examined by a hematologist to make sure nothing else going on that caused the neuropathy.  If agreeable, refer to Dr. Alvy Bimler with Dx of IgA monoclonal gammopathy

## 2016-03-28 NOTE — Telephone Encounter (Signed)
Spoke with son and made him aware.  Referral sent to Dr. Alvy Bimler.

## 2016-04-03 ENCOUNTER — Telehealth: Payer: Self-pay | Admitting: Neurology

## 2016-04-03 ENCOUNTER — Encounter: Payer: Self-pay | Admitting: Hematology and Oncology

## 2016-04-03 ENCOUNTER — Telehealth: Payer: Self-pay | Admitting: Hematology and Oncology

## 2016-04-03 NOTE — Telephone Encounter (Signed)
PT's son Gwyndolyn Saxon called in regards to not hearing from a hematologist yet regarding an appointment/Dawn CB# [phone]

## 2016-04-03 NOTE — Telephone Encounter (Signed)
Pt's son returned my call to schedule an appt with Alvy Bimler on 2/1 at 2pm. Aware to arrive 30 minutes early. Letter mailed.

## 2016-04-03 NOTE — Telephone Encounter (Signed)
Gave son contact number for cancer center.

## 2016-04-14 DIAGNOSIS — H1131 Conjunctival hemorrhage, right eye: Secondary | ICD-10-CM | POA: Diagnosis not present

## 2016-04-14 DIAGNOSIS — H04123 Dry eye syndrome of bilateral lacrimal glands: Secondary | ICD-10-CM | POA: Diagnosis not present

## 2016-04-21 DIAGNOSIS — H52223 Regular astigmatism, bilateral: Secondary | ICD-10-CM | POA: Diagnosis not present

## 2016-04-21 DIAGNOSIS — H524 Presbyopia: Secondary | ICD-10-CM | POA: Diagnosis not present

## 2016-04-21 DIAGNOSIS — H5213 Myopia, bilateral: Secondary | ICD-10-CM | POA: Diagnosis not present

## 2016-04-21 DIAGNOSIS — H1131 Conjunctival hemorrhage, right eye: Secondary | ICD-10-CM | POA: Diagnosis not present

## 2016-04-21 DIAGNOSIS — H26492 Other secondary cataract, left eye: Secondary | ICD-10-CM | POA: Diagnosis not present

## 2016-04-21 DIAGNOSIS — H35033 Hypertensive retinopathy, bilateral: Secondary | ICD-10-CM | POA: Diagnosis not present

## 2016-04-21 DIAGNOSIS — H04123 Dry eye syndrome of bilateral lacrimal glands: Secondary | ICD-10-CM | POA: Diagnosis not present

## 2016-04-24 ENCOUNTER — Ambulatory Visit (HOSPITAL_BASED_OUTPATIENT_CLINIC_OR_DEPARTMENT_OTHER): Payer: Medicare Other | Admitting: Hematology and Oncology

## 2016-04-24 ENCOUNTER — Telehealth: Payer: Self-pay | Admitting: Hematology and Oncology

## 2016-04-24 ENCOUNTER — Encounter: Payer: Self-pay | Admitting: Hematology and Oncology

## 2016-04-24 ENCOUNTER — Ambulatory Visit (HOSPITAL_BASED_OUTPATIENT_CLINIC_OR_DEPARTMENT_OTHER): Payer: Medicare Other

## 2016-04-24 DIAGNOSIS — R233 Spontaneous ecchymoses: Secondary | ICD-10-CM

## 2016-04-24 DIAGNOSIS — D472 Monoclonal gammopathy: Secondary | ICD-10-CM

## 2016-04-24 DIAGNOSIS — R300 Dysuria: Secondary | ICD-10-CM

## 2016-04-24 DIAGNOSIS — Z85038 Personal history of other malignant neoplasm of large intestine: Secondary | ICD-10-CM | POA: Diagnosis not present

## 2016-04-24 DIAGNOSIS — R238 Other skin changes: Secondary | ICD-10-CM

## 2016-04-24 LAB — CBC WITH DIFFERENTIAL/PLATELET
BASO%: 0.7 % (ref 0.0–2.0)
Basophils Absolute: 0 10*3/uL (ref 0.0–0.1)
EOS%: 1.3 % (ref 0.0–7.0)
Eosinophils Absolute: 0.1 10*3/uL (ref 0.0–0.5)
HEMATOCRIT: 39 % (ref 34.8–46.6)
HEMOGLOBIN: 13.2 g/dL (ref 11.6–15.9)
LYMPH#: 1 10*3/uL (ref 0.9–3.3)
LYMPH%: 20.8 % (ref 14.0–49.7)
MCH: 29.3 pg (ref 25.1–34.0)
MCHC: 33.7 g/dL (ref 31.5–36.0)
MCV: 86.8 fL (ref 79.5–101.0)
MONO#: 0.4 10*3/uL (ref 0.1–0.9)
MONO%: 8.6 % (ref 0.0–14.0)
NEUT#: 3.4 10*3/uL (ref 1.5–6.5)
NEUT%: 68.6 % (ref 38.4–76.8)
Platelets: 199 10*3/uL (ref 145–400)
RBC: 4.5 10*6/uL (ref 3.70–5.45)
RDW: 15.9 % — AB (ref 11.2–14.5)
WBC: 5 10*3/uL (ref 3.9–10.3)

## 2016-04-24 LAB — COMPREHENSIVE METABOLIC PANEL
ALBUMIN: 4.2 g/dL (ref 3.5–5.0)
ALT: 32 U/L (ref 0–55)
AST: 35 U/L — AB (ref 5–34)
Alkaline Phosphatase: 117 U/L (ref 40–150)
Anion Gap: 9 mEq/L (ref 3–11)
BUN: 22.1 mg/dL (ref 7.0–26.0)
CALCIUM: 9.7 mg/dL (ref 8.4–10.4)
CHLORIDE: 106 meq/L (ref 98–109)
CO2: 27 mEq/L (ref 22–29)
CREATININE: 1.2 mg/dL — AB (ref 0.6–1.1)
EGFR: 40 mL/min/{1.73_m2} — ABNORMAL LOW (ref >90)
GLUCOSE: 119 mg/dL (ref 70–140)
POTASSIUM: 4.7 meq/L (ref 3.5–5.1)
SODIUM: 141 meq/L (ref 136–145)
Total Bilirubin: 1.2 mg/dL (ref 0.20–1.20)
Total Protein: 6.8 g/dL (ref 6.4–8.3)

## 2016-04-24 LAB — URINALYSIS, MICROSCOPIC - CHCC
BLOOD: NEGATIVE
Bilirubin (Urine): NEGATIVE
Glucose: NEGATIVE mg/dL
Ketones: NEGATIVE mg/dL
Nitrite: POSITIVE
PH: 7 (ref 4.6–8.0)
Protein: 30 mg/dL
RBC / HPF: NEGATIVE (ref 0–2)
SPECIFIC GRAVITY, URINE: 1.01 (ref 1.003–1.035)
UROBILINOGEN UR: 1 mg/dL (ref 0.2–1)

## 2016-04-24 NOTE — Telephone Encounter (Signed)
Gave patient avs report and appointments for February. Per 2/1 los xray today. Per patient/son they will do xray tomorrow.

## 2016-04-25 DIAGNOSIS — R233 Spontaneous ecchymoses: Secondary | ICD-10-CM | POA: Insufficient documentation

## 2016-04-25 DIAGNOSIS — R238 Other skin changes: Secondary | ICD-10-CM | POA: Insufficient documentation

## 2016-04-25 LAB — KAPPA/LAMBDA LIGHT CHAINS
IG KAPPA FREE LIGHT CHAIN: 19.3 mg/L (ref 3.3–19.4)
IG LAMBDA FREE LIGHT CHAIN: 19.7 mg/L (ref 5.7–26.3)
Kappa/Lambda FluidC Ratio: 0.98 (ref 0.26–1.65)

## 2016-04-25 LAB — SEDIMENTATION RATE: Sedimentation Rate-Westergren: 3 mm/hr (ref 0–40)

## 2016-04-25 NOTE — Assessment & Plan Note (Signed)
She has no new symptoms. With her age and possibly dementia, I would not recommend surveillance imaging or colonoscopy unless the patient has new symptoms

## 2016-04-25 NOTE — Assessment & Plan Note (Signed)
She has recent bruising spontaneously near her right eye. She was seen by recent ophthalmologist. The son described easy bruising. This is related to her antiplatelet agents I recommend consideration of discontinuation of Plavix if she continues to have recurrent falls

## 2016-04-25 NOTE — Assessment & Plan Note (Signed)
The patient is unlikely to have neurological symptoms as a result of MGUS. She has occasional incontinence and difficulties following instruction. For this reason, I will not order a 24-hour urine collection. I will repeat blood work and imaging study. If test results show early MGUS, she can be followed once a year I spent a lot of time discussing the natural history of MGUS and association with neurological symptoms such as neuropathy

## 2016-04-25 NOTE — Progress Notes (Signed)
[place] NOTE  Patient Care Team: Burnis Medin, MD as PCP - General (Internal Medicine) Paralee Cancel, MD (Orthopedic Surgery) Jolaine Artist, MD (Cardiology) Burnell Blanks, MD as Attending Physician (Cardiology) Calvert Cantor, MD as Attending Physician (Ophthalmology) Eustace Quail Tat, DO as Consulting Physician (Neurology)  CHIEF COMPLAINTS/PURPOSE OF CONSULTATION:  MGUS  HISTORY OF PRESENTING ILLNESS:  Audrey [name] 83 y.o. female is here because of recent finding of MGUS. She has recurrent falls and memory deficit. Last year, she had significant fall and fractured her clavicle, managed conservatively. She was evaluated by neurologist. As part of her workup, she was noted to have abnormal serum protein electrophoresis. Blood work from March 25, 2016 showed abnormalities in the lambda region. She also had mild renal failure. She was hence referred here for further evaluation. Recently, she workup with hemorrhage around the right eye. It does not affect her vision. She had easy bruising. She has weakness especially in her left leg and recurrent falls.  She denies history of abnormal bone pain now Patient denies history of recurrent infection or atypical infections such as shingles of meningitis. Denies chills, night sweats, anorexia or abnormal weight loss. She had remote history of colon cancer over 20 years ago, managed with surgery only without need for adjuvant treatment She has frequent surveillance colonoscopy without evidence of recurrence. She denies recent changes in bowel habits  MEDICAL HISTORY:  Past Medical History:  Diagnosis Date  . AAA (abdominal aortic aneurysm) ([facility])    small  . Bilateral carotid artery stenosis   . CAD (coronary artery disease)    including LM disease; s/p CABG  . Colon cancer ([place])   . History of chickenpox   . History of colon cancer    s/p resection in 2004  . History of colonoscopy   . HTN  (hypertension)   . Hyperlipidemia   . OA (osteoarthritis)    severe; s/p bilateral hip replacement    SURGICAL HISTORY: Past Surgical History:  Procedure Laterality Date  . CATARACT EXTRACTION, BILATERAL    . CORONARY ARTERY BYPASS GRAFT  2004  . left hip replacement  2007  . PARTIAL COLECTOMY  2004   sigmoid resection  . REFRACTIVE SURGERY  2013   had a film over eye removed.  . right hip replacement  2008    SOCIAL HISTORY: Social History   Social History  . Marital status: Widowed    Spouse name: N/A  . Number of children: 3  . Years of education: N/A   Occupational History  . 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 Smoking status: Never Smoker  . Smokeless tobacco: Never Used  . Alcohol use No  . Drug use: No  . Sexual activity: Not on file   Other Topics Concern  . 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      [id number]  Children are well   Lives independently by herself no pets family daughter  lives close by 8 hours of sleep at least    Negative TAD   14 years of education      Hearing aid and dentures   No falls .  Has smoke detector and wears seat belts. . No excess sun exposure. . No depression  FAMILY HISTORY: Family History  Problem Relation Age of Onset  . Cancer Mother     intra-abdominal/gallbladder  . Cholelithiasis Mother   . Heart attack Father     43  . Heart disease Father   . Diabetes Sister     3de  . Other Brother     GSW 58   . Heart attack Sister   . Colon cancer Neg Hx     ALLERGIES:  has No Known Allergies.  MEDICATIONS:  Current Outpatient Prescriptions  Medication Sig Dispense Refill  . aspirin (ADULT ASPIRIN EC LOW STRENGTH) 81 MG EC tablet Take 81 mg by mouth daily.      . Cholecalciferol (VITAMIN D3) 2000 units TABS Take  2,000 Units by mouth 2 (two) times daily.    . clopidogrel (PLAVIX) 75 MG tablet Take 1 tablet (75 mg total) by mouth daily. 90 tablet 3  . donepezil (ARICEPT) 10 MG tablet Take 1 tablet (10 mg total) by mouth at bedtime. 30 tablet 2  . ezetimibe (ZETIA) 10 MG tablet Take 1 tablet (10 mg total) by mouth daily. 90 tablet 3  . ibuprofen (ADVIL,MOTRIN) 200 MG tablet Take 400 mg by mouth every 6 (six) hours as needed for mild pain.    . metoprolol succinate (TOPROL-XL) 50 MG 24 hr tablet Take 1 tablet (50 mg total) by mouth daily. 90 tablet 3  . Multiple Vitamins-Minerals (ALIVE WOMENS 50+ PO) Take 1 tablet by mouth daily.    . rosuvastatin (CRESTOR) 40 MG tablet Take 1 tablet (40 mg total) by mouth daily. (Patient taking differently: Take 40 mg by mouth every evening. ) 90 tablet 3  . telmisartan (MICARDIS) 20 MG tablet Take 1 tablet (20 mg total) by mouth daily. 90 tablet 3  . vitamin C (ASCORBIC ACID) 500 MG tablet Take 500 mg by mouth daily.     No current facility-administered medications for this visit.     REVIEW OF SYSTEMS:   Eyes: Denies blurriness of vision, double vision or watery eyes Ears, nose, mouth, throat, and face: Denies mucositis or sore throat Respiratory: Denies cough, dyspnea or wheezes Cardiovascular: Denies palpitation, chest discomfort or lower extremity swelling Gastrointestinal:  Denies nausea, heartburn or change in bowel habits Skin: Denies abnormal skin rashes Lymphatics: Denies new lymphadenopathy  Behavioral/Psych: Mood is stable, no new changes  All other systems were reviewed with the patient and are negative.  PHYSICAL EXAMINATION: ECOG PERFORMANCE STATUS: 1 - Symptomatic but completely ambulatory  Vitals:   04/24/16 1401  BP: (!) 140/49  Pulse: 70  Resp: 18  Temp: 98.4 F (36.9 C)   Filed Weights   04/24/16 1401  Weight: 132 lb 9.6 oz (60.1 kg)    GENERAL:alert, no distress and comfortable. She looks elderly SKIN: Noted to skin bruises EYES  noted mild conjunctiva hemorrhage and bruising around the right eye, OROPHARYNX:no exudate, no erythema and lips, buccal mucosa, and tongue normal  NECK: supple, thyroid normal size, non-tender, without nodularity LYMPH:  no palpable lymphadenopathy in the cervical, axillary or inguinal LUNGS: clear to auscultation and percussion with normal breathing effort HEART: regular rate & rhythm and no murmurs and no lower extremity edema ABDOMEN:abdomen soft, non-tender and normal bowel sounds Musculoskeletal:no cyanosis of digits and no clubbing  PSYCH: alert & oriented x 3 with fluent speech NEURO: no focal motor/sensory deficits  LABORATORY DATA:  I have reviewed the data as listed Lab Results  Component Value Date   WBC 5.0 04/24/2016   HGB 13.2  04/24/2016   HCT 39.0 04/24/2016   MCV 86.8 04/24/2016   PLT 199 04/24/2016    ASSESSMENT & PLAN:   MGUS (monoclonal gammopathy of unknown significance) The patient is unlikely to have neurological symptoms as a result of MGUS. She has occasional incontinence and difficulties following instruction. For this reason, I will not order a 24-hour urine collection. I will repeat blood work and imaging study. If test results show early MGUS, she can be followed once a year I spent a lot of time discussing the natural history of MGUS and association with neurological symptoms such as neuropathy  Easy bruising She has recent bruising spontaneously near her right eye. She was seen by recent ophthalmologist. The son described easy bruising. This is related to her antiplatelet agents I recommend consideration of discontinuation of Plavix if she continues to have recurrent falls  History of colon cancer She has no new symptoms. With her age and possibly dementia, I would not recommend surveillance imaging or colonoscopy unless the patient has new symptoms    Orders Placed This Encounter  Procedures  . Urine culture    Standing Status:   Future     Number of Occurrences:   1    Standing Expiration Date:   06/24/2017  . DG Bone Survey Met    Standing Status:   Future    Standing Expiration Date:   06/24/2017    Order Specific Question:   Reason for exam:    Answer:   MGUS, exclude myeloma, bone pain    Order Specific Question:   Preferred imaging location?    Answer:   [facility]  . CBC with Differential/Platelet    Standing Status:   Future    Number of Occurrences:   1    Standing Expiration Date:   06/24/2017  . Comprehensive metabolic panel    Standing Status:   Future    Number of Occurrences:   1    Standing Expiration Date:   06/24/2017  . Kappa/lambda light chains    Standing Status:   Future    Number of Occurrences:   1    Standing Expiration Date:   06/24/2017  . Multiple Myeloma Panel (SPEP&IFE w/QIG)    Standing Status:   Future    Number of Occurrences:   1    Standing Expiration Date:   06/24/2017  . Sedimentation rate    Standing Status:   Future    Number of Occurrences:   1    Standing Expiration Date:   06/24/2017  . Urinalysis, Microscopic - [facility]    Standing Status:   Future    Number of Occurrences:   1    Standing Expiration Date:   06/24/2017    All questions were answered. The patient knows to call the clinic with any problems, questions or concerns. I spent 40 minutes counseling the patient face to face. The total time spent in the appointment was 55 minutes and more than 50% was on counseling.     Heath Lark, MD 04/25/16 6:05 PM

## 2016-04-26 LAB — URINE CULTURE

## 2016-04-28 ENCOUNTER — Other Ambulatory Visit: Payer: Self-pay

## 2016-04-28 ENCOUNTER — Telehealth: Payer: Self-pay

## 2016-04-28 ENCOUNTER — Ambulatory Visit (HOSPITAL_COMMUNITY)
Admission: RE | Admit: 2016-04-28 | Discharge: 2016-04-28 | Disposition: A | Discharge status: Home / Self Care | Payer: Medicare Other | Source: Ambulatory Visit | Attending: Hematology and Oncology | Admitting: Hematology and Oncology

## 2016-04-28 DIAGNOSIS — D472 Monoclonal gammopathy: Secondary | ICD-10-CM

## 2016-04-28 DIAGNOSIS — Z96643 Presence of artificial hip joint, bilateral: Secondary | ICD-10-CM | POA: Insufficient documentation

## 2016-04-28 DIAGNOSIS — I700 Atherosclerosis of aorta: Secondary | ICD-10-CM | POA: Insufficient documentation

## 2016-04-28 DIAGNOSIS — M5030 Other cervical disc degeneration, unspecified cervical region: Secondary | ICD-10-CM | POA: Insufficient documentation

## 2016-04-28 DIAGNOSIS — M5134 Other intervertebral disc degeneration, thoracic region: Secondary | ICD-10-CM | POA: Diagnosis not present

## 2016-04-28 DIAGNOSIS — M5136 Other intervertebral disc degeneration, lumbar region: Secondary | ICD-10-CM | POA: Diagnosis not present

## 2016-04-28 LAB — MULTIPLE MYELOMA PANEL, SERUM
ALBUMIN SERPL ELPH-MCNC: 3.8 g/dL (ref 2.9–4.4)
ALPHA 1: 0.3 g/dL (ref 0.0–0.4)
Albumin/Glob SerPl: 1.6 (ref 0.7–1.7)
Alpha2 Glob SerPl Elph-Mcnc: 0.6 g/dL (ref 0.4–1.0)
B-Globulin SerPl Elph-Mcnc: 0.9 g/dL (ref 0.7–1.3)
GAMMA GLOB SERPL ELPH-MCNC: 0.6 g/dL (ref 0.4–1.8)
GLOBULIN, TOTAL: 2.4 g/dL (ref 2.2–3.9)
IGM (IMMUNOGLOBIN M), SRM: 48 mg/dL (ref 26–217)
IgA, Qn, Serum: 188 mg/dL (ref 64–422)
IgG, Qn, Serum: 527 mg/dL — ABNORMAL LOW (ref 700–1600)
TOTAL PROTEIN: 6.2 g/dL (ref 6.0–8.5)

## 2016-04-28 MED ORDER — CIPROFLOXACIN HCL 250 MG PO TABS: 250.0000 mg | ORAL_TABLET | Freq: Two times a day (BID) | ORAL | 0 refills | Status: DC

## 2016-04-28 NOTE — Telephone Encounter (Signed)
-----   Message from Heath Lark, MD sent at 04/26/2016 10:10 AM EST ----- Regarding: probable UTI Pls call patient/son. Prescribe cipro 250 mg BID PO x 3 days ----- Message ----- From: Interface, Lab In Three Zero One Sent: 04/24/2016   3:46 PM To: Heath Lark, MD

## 2016-04-28 NOTE — Telephone Encounter (Signed)
Notified of message below, Rx sent to [facility].

## 2016-05-01 ENCOUNTER — Ambulatory Visit (HOSPITAL_BASED_OUTPATIENT_CLINIC_OR_DEPARTMENT_OTHER): Payer: Medicare Other | Admitting: Hematology and Oncology

## 2016-05-01 ENCOUNTER — Encounter: Payer: Self-pay | Admitting: Hematology and Oncology

## 2016-05-01 ENCOUNTER — Telehealth: Payer: Self-pay | Admitting: Hematology and Oncology

## 2016-05-01 VITALS — BP 118/60 | HR 63 | Temp 97.8°F | Resp 17 | Ht 61.0 in | Wt 131.9 lb

## 2016-05-01 DIAGNOSIS — D472 Monoclonal gammopathy: Secondary | ICD-10-CM | POA: Diagnosis not present

## 2016-05-01 DIAGNOSIS — N390 Urinary tract infection, site not specified: Secondary | ICD-10-CM

## 2016-05-01 DIAGNOSIS — N184 Chronic kidney disease, stage 4 (severe): Secondary | ICD-10-CM | POA: Diagnosis not present

## 2016-05-01 DIAGNOSIS — Z85038 Personal history of other malignant neoplasm of large intestine: Secondary | ICD-10-CM | POA: Diagnosis not present

## 2016-05-01 NOTE — Assessment & Plan Note (Signed)
Her recent kidney function tests show chronic kidney disease stage IV. She is not symptomatic. This is likely related to old age and dehydration. Recommend aggressive risk factor modification and increase oral fluid intake.

## 2016-05-01 NOTE — Progress Notes (Signed)
[place] OFFICE PROGRESS NOTE  Patient Care Team: Burnis Medin, MD as PCP - General (Internal Medicine) Paralee Cancel, MD (Orthopedic Surgery) Jolaine Artist, MD (Cardiology) Burnell Blanks, MD as Attending Physician (Cardiology) Calvert Cantor, MD as Attending Physician (Ophthalmology) Eustace Quail Tat, DO as Consulting Physician (Neurology)  SUMMARY OF ONCOLOGIC HISTORY:  Audrey [name] 83 y.o. female is here because of recent finding of MGUS. She has recurrent falls and memory deficit. Last year, she had significant fall and fractured her clavicle, managed conservatively. She was evaluated by neurologist. As part of her workup, she was noted to have abnormal serum protein electrophoresis. Blood work from March 25, 2016 showed abnormalities in the lambda region. She also had mild renal failure. She was hence referred here for further evaluation. Recently, she workup with hemorrhage around the right eye. It does not affect her vision. She had easy bruising. She has weakness especially in her left leg and recurrent falls.  She denies history of abnormal bone pain now Patient denies history of recurrent infection or atypical infections such as shingles of meningitis. Denies chills, night sweats, anorexia or abnormal weight loss. She had remote history of colon cancer over 20 years ago, managed with surgery only without need for adjuvant treatment She has frequent surveillance colonoscopy without evidence of recurrence. INTERVAL HISTORY: Please see below for problem oriented charting. She returns with her son and daughter-in-law to review test results. Last week, she was found to have signs of urinary tract infection. The symptoms subsequently improved with oral antibiotics. She has no new complaints  REVIEW OF SYSTEMS:   Constitutional: Denies fevers, chills or abnormal weight loss Eyes: Denies blurriness of vision Ears, nose, mouth, throat, and face:  Denies mucositis or sore throat Respiratory: Denies cough, dyspnea or wheezes Cardiovascular: Denies palpitation, chest discomfort or lower extremity swelling Gastrointestinal:  Denies nausea, heartburn or change in bowel habits Skin: Denies abnormal skin rashes Lymphatics: Denies new lymphadenopathy or easy bruising Neurological:Denies numbness, tingling or new weaknesses Behavioral/Psych: Mood is stable, no new changes  All other systems were reviewed with the patient and are negative.  I have reviewed the past medical history, past surgical history, social history and family history with the patient and they are unchanged from previous note.  ALLERGIES:  has No Known Allergies.  MEDICATIONS:  Current Outpatient Prescriptions  Medication Sig Dispense Refill  . aspirin (ADULT ASPIRIN EC LOW STRENGTH) 81 MG EC tablet Take 81 mg by mouth daily.      . Cholecalciferol (VITAMIN D3) 2000 units TABS Take 2,000 Units by mouth 2 (two) times daily.    . clopidogrel (PLAVIX) 75 MG tablet Take 1 tablet (75 mg total) by mouth daily. 90 tablet 3  . donepezil (ARICEPT) 10 MG tablet Take 1 tablet (10 mg total) by mouth at bedtime. 30 tablet 2  . ezetimibe (ZETIA) 10 MG tablet Take 1 tablet (10 mg total) by mouth daily. 90 tablet 3  . ibuprofen (ADVIL,MOTRIN) 200 MG tablet Take 400 mg by mouth every 6 (six) hours as needed for mild pain.    . metoprolol succinate (TOPROL-XL) 50 MG 24 hr tablet Take 1 tablet (50 mg total) by mouth daily. 90 tablet 3  . Multiple Vitamins-Minerals (ALIVE WOMENS 50+ PO) Take 1 tablet by mouth daily.    . rosuvastatin (CRESTOR) 40 MG tablet Take 1 tablet (40 mg total) by mouth daily. (Patient taking differently: Take 40 mg by mouth every evening. ) 90 tablet 3  .  telmisartan (MICARDIS) 20 MG tablet Take 1 tablet (20 mg total) by mouth daily. 90 tablet 3  . vitamin C (ASCORBIC ACID) 500 MG tablet Take 500 mg by mouth daily.     No current facility-administered medications  for this visit.     PHYSICAL EXAMINATION: ECOG PERFORMANCE STATUS: 1 - Symptomatic but completely ambulatory  Vitals:   05/01/16 1419  BP: 118/60  Pulse: 63  Resp: 17  Temp: 97.8 F (36.6 C)   Filed Weights   05/01/16 1419  Weight: 131 lb 14.4 oz (59.8 kg)    GENERAL:alert, no distress and comfortable SKIN: skin color, texture, turgor are normal, no rashes or significant lesions EYES: normal, Conjunctiva are pink and non-injected, sclera clear Musculoskeletal:no cyanosis of digits and no clubbing  NEURO: alert & oriented x 3 with fluent speech, no focal motor/sensory deficits  LABORATORY DATA:  I have reviewed the data as listed    Component Value Date/Time   NA 141 04/24/2016 1535   K 4.7 04/24/2016 1535   CL 100 03/25/2016 1050   CO2 27 04/24/2016 1535   GLUCOSE 119 04/24/2016 1535   BUN 22.1 04/24/2016 1535   CREATININE 1.2 (H) 04/24/2016 1535   CALCIUM 9.7 04/24/2016 1535   PROT 6.8 04/24/2016 1535   PROT 6.2 04/24/2016 1535   ALBUMIN 4.2 04/24/2016 1535   AST 35 (H) 04/24/2016 1535   ALT 32 04/24/2016 1535   ALKPHOS 117 04/24/2016 1535   BILITOT 1.20 04/24/2016 1535   GFRNONAA 45.93 12/05/2008 0841   GFRAA 69 05/02/2008 0941    No results found for: SPEP, UPEP  Lab Results  Component Value Date   WBC 5.0 04/24/2016   NEUTROABS 3.4 04/24/2016   HGB 13.2 04/24/2016   HCT 39.0 04/24/2016   MCV 86.8 04/24/2016   PLT 199 04/24/2016      Chemistry      Component Value Date/Time   NA 141 04/24/2016 1535   K 4.7 04/24/2016 1535   CL 100 03/25/2016 1050   CO2 27 04/24/2016 1535   BUN 22.1 04/24/2016 1535   CREATININE 1.2 (H) 04/24/2016 1535      Component Value Date/Time   CALCIUM 9.7 04/24/2016 1535   ALKPHOS 117 04/24/2016 1535   AST 35 (H) 04/24/2016 1535   ALT 32 04/24/2016 1535   BILITOT 1.20 04/24/2016 1535       RADIOGRAPHIC STUDIES: I have personally reviewed the radiological images as listed and agreed with the findings in the  report. Dg Bone Survey Met  Result Date: 04/28/2016 CLINICAL DATA:  Monoclonal gammopathy of unknown significance, history coronary disease, hypertension, colon cancer EXAM: METASTATIC BONE SURVEY COMPARISON:  RIGHT clavicular radiographs 01/18/2016 FINDINGS: Dentures and hearing aid noted. Bones appear demineralized. Deformity of distal RIGHT clavicle secondary to known fracture unchanged since 01/18/2016. Prior median sternotomy. Subacute comminuted intra-articular distal RIGHT radial metaphyseal fracture. Mild degenerative disc disease changes lower cervical spine as well as scattered throughout thoracic and lumbar spine with grade 1 anterolisthesis L4-L5. Prior CABG. RIGHT upper quadrant calcifications question calcified gallstones. Atherosclerotic calcification aorta. Prior BILATERAL total hip arthroplasty. No destructive bone lesion seen. IMPRESSION: No destructive bone lesions identified. Subacute fractures of the distal RIGHT clavicle and distal RIGHT radius as above. Scattered degenerative disc disease changes of the cervical, thoracic and lumbar spine. Aortic atherosclerosis. Prior BILATERAL total hip arthroplasty. Electronically Signed   By: Lavonia Dana M.D.   On: 04/28/2016 15:16    ASSESSMENT & PLAN:  MGUS (monoclonal gammopathy of  unknown significance) Serum immunofixation detected IgA kappa although M protein was not detectable, along with normal IgA level and serum kappa light chain. Overall, she has early-stage disease. Her chronic renal failure is unrelated. She has no signs of anemia, hypercalcemia or bone lesions. I recommend observation only with yearly visit  History of colon cancer She has no new symptoms. With her age and possibly dementia, I would not recommend surveillance imaging or colonoscopy unless the patient has new symptoms  UTI (urinary tract infection) Family members thought she may have recent confusion. Urine studies confirm possible urinary tract  infection. She was adequately treated with ciprofloxacin with improvement of symptoms. I recommend increase oral intake in the future to avoid recurrent UTI  CKD (chronic kidney disease), stage IV (HCC) Her recent kidney function tests show chronic kidney disease stage IV. She is not symptomatic. This is likely related to old age and dehydration. Recommend aggressive risk factor modification and increase oral fluid intake.   Orders Placed This Encounter  Procedures  . Comprehensive metabolic panel    Standing Status:   Future    Standing Expiration Date:   09/05/2017  . CBC with Differential/Platelet    Standing Status:   Future    Standing Expiration Date:   09/05/2017  . Kappa/lambda light chains    Standing Status:   Future    Standing Expiration Date:   09/05/2017  . Multiple Myeloma Panel (SPEP&IFE w/QIG)    Standing Status:   Future    Standing Expiration Date:   09/05/2017   All questions were answered. The patient knows to call the clinic with any problems, questions or concerns. No barriers to learning was detected. I spent 15 minutes counseling the patient face to face. The total time spent in the appointment was 20 minutes and more than 50% was on counseling and review of test results     Heath Lark, MD 05/01/2016 5:51 PM

## 2016-05-01 NOTE — Telephone Encounter (Signed)
Appointments scheduled per 2/8 LOS. Patient given AVS report and calendars with future scheduled appointments.  °

## 2016-05-01 NOTE — Assessment & Plan Note (Signed)
Serum immunofixation detected IgA kappa although M protein was not detectable, along with normal IgA level and serum kappa light chain. Overall, she has early-stage disease. Her chronic renal failure is unrelated. She has no signs of anemia, hypercalcemia or bone lesions. I recommend observation only with yearly visit

## 2016-05-01 NOTE — Assessment & Plan Note (Signed)
She has no new symptoms. With her age and possibly dementia, I would not recommend surveillance imaging or colonoscopy unless the patient has new symptoms

## 2016-05-01 NOTE — Assessment & Plan Note (Signed)
Family members thought she may have recent confusion. Urine studies confirm possible urinary tract infection. She was adequately treated with ciprofloxacin with improvement of symptoms. I recommend increase oral intake in the future to avoid recurrent UTI

## 2016-05-06 ENCOUNTER — Telehealth: Payer: Self-pay | Admitting: Cardiovascular Disease

## 2016-05-06 ENCOUNTER — Telehealth: Payer: Self-pay | Admitting: Neurology

## 2016-05-06 NOTE — Telephone Encounter (Signed)
Spoke with patient's son and he states for the past week his mother isn't sleeping well. She just switched from Aricept 5 - 10 mg a week ago. She is taking this at night. She also seems more edgy. He states she seems to lay in bed thinking about things from 40+ years ago. Hallucinations do seem better but not gone.  Please advise.

## 2016-05-06 NOTE — Telephone Encounter (Signed)
PT needs a call back regarding the medication she is taking and not sleeping well/Dawn CB# [phone]

## 2016-05-06 NOTE — Telephone Encounter (Signed)
F/U Call: Returning your call, thanks.

## 2016-05-06 NOTE — Telephone Encounter (Signed)
Patient returning your call, thanks. °

## 2016-05-06 NOTE — Telephone Encounter (Signed)
Can she try changing to AM time?

## 2016-05-06 NOTE — Telephone Encounter (Signed)
Patient's son made aware. They will call back if this doesn't help.

## 2016-05-06 NOTE — Telephone Encounter (Signed)
Return call was from pt's daughter in law. I spoke with her and told her pt has been scheduled for year follow up with Dr. Angelena Form on 3/8 at 4:00

## 2016-05-22 ENCOUNTER — Encounter: Payer: Self-pay | Admitting: Cardiovascular Disease

## 2016-05-29 ENCOUNTER — Ambulatory Visit (INDEPENDENT_AMBULATORY_CARE_PROVIDER_SITE_OTHER): Payer: Medicare Other | Admitting: Cardiovascular Disease

## 2016-05-29 ENCOUNTER — Encounter: Payer: Self-pay | Admitting: Cardiovascular Disease

## 2016-05-29 VITALS — BP 162/78 | HR 78 | Ht 61.0 in | Wt 133.1 lb

## 2016-05-29 DIAGNOSIS — I739 Peripheral vascular disease, unspecified: Principal | ICD-10-CM

## 2016-05-29 DIAGNOSIS — E7800 Pure hypercholesterolemia, unspecified: Secondary | ICD-10-CM

## 2016-05-29 DIAGNOSIS — I10 Essential (primary) hypertension: Secondary | ICD-10-CM

## 2016-05-29 DIAGNOSIS — I2510 Atherosclerotic heart disease of native coronary artery without angina pectoris: Secondary | ICD-10-CM | POA: Diagnosis not present

## 2016-05-29 DIAGNOSIS — I779 Disorder of arteries and arterioles, unspecified: Secondary | ICD-10-CM | POA: Diagnosis not present

## 2016-05-29 NOTE — Patient Instructions (Signed)
Medication Instructions:  Your physician recommends that you continue on your current medications as directed. Please refer to the Current Medication list given to you today.   Labwork: none  Testing/Procedures: Your physician has requested that you have a carotid duplex. This test is an ultrasound of the carotid arteries in your neck. It looks at blood flow through these arteries that supply the brain with blood. Allow one hour for this exam. There are no restrictions or special instructions. To be done in June 2018  Follow-Up: Your physician recommends that you schedule a follow-up appointment in: 12 months. Please call our office in about 9 months to schedule this appointment    Any Other Special Instructions Will Be Listed Below (If Applicable).     If you need a refill on your cardiac medications before your next appointment, please call your pharmacy.

## 2016-05-29 NOTE — Progress Notes (Signed)
Chief Complaint  Patient presents with  . 1 year follow up    History of Present Illness: 83 yo WF with history of coronary artery disease s/p 2V CABG in December 2004, HTN, HLD, bilateral carotid artery stenosis, AAA, as well as elevated liver enzymes on Lipitor who is here today for cardiac follow up. She has been followed in the past by Dr. Haroldine Laws. Carotid u/s June 2017 with stable moderate bilateral ICA stenosis. Abdominal u/s 11/18/10 showed very small AAA 2.1x 2.1 cm, stable. Stress test January 2016 with no ischemia.   She is here today for follow up. She denies chest pain or dyspnea. She is with her son and he reports that she has been seing things. She is seeing neurology and now on Aricept. No LE edema, dizziness, near syncope.    Primary Care Physician: Lottie Dawson, MD  Past Medical History:  Diagnosis Date  . AAA (abdominal aortic aneurysm) (HCC)    small  . Bilateral carotid artery stenosis   . CAD (coronary artery disease)    including LM disease; s/p CABG  . Colon cancer ([facility])   . History of chickenpox   . History of colon cancer    s/p resection in 2004  . History of colonoscopy   . HTN (hypertension)   . Hyperlipidemia   . OA (osteoarthritis)    severe; s/p bilateral hip replacement    Past Surgical History:  Procedure Laterality Date  . CATARACT EXTRACTION, BILATERAL    . CORONARY ARTERY BYPASS GRAFT  2004  . left hip replacement  2007  . PARTIAL COLECTOMY  2004   sigmoid resection  . REFRACTIVE SURGERY  2013   had a film over eye removed.  . right hip replacement  2008    Current Outpatient Prescriptions  Medication Sig Dispense Refill  . aspirin (ADULT ASPIRIN EC LOW STRENGTH) 81 MG EC tablet Take 81 mg by mouth daily.      . Cholecalciferol (VITAMIN D3) 2000 units TABS Take 2,000 Units by mouth 2 (two) times daily.    . clopidogrel (PLAVIX) 75 MG tablet Take 1 tablet (75 mg total) by mouth daily. 90 tablet 3  . donepezil (ARICEPT) 10 MG  tablet Take 1 tablet (10 mg total) by mouth at bedtime. 30 tablet 2  . ezetimibe (ZETIA) 10 MG tablet Take 1 tablet (10 mg total) by mouth daily. 90 tablet 3  . ibuprofen (ADVIL,MOTRIN) 200 MG tablet Take 400 mg by mouth every 6 (six) hours as needed for mild pain.    . metoprolol succinate (TOPROL-XL) 50 MG 24 hr tablet Take 1 tablet (50 mg total) by mouth daily. 90 tablet 3  . Multiple Vitamins-Minerals (ALIVE WOMENS 50+ PO) Take 1 tablet by mouth daily.    . rosuvastatin (CRESTOR) 40 MG tablet Take 40 mg by mouth every evening.    Marland Kitchen telmisartan (MICARDIS) 20 MG tablet Take 1 tablet (20 mg total) by mouth daily. 90 tablet 3  . vitamin C (ASCORBIC ACID) 500 MG tablet Take 500 mg by mouth daily.     No current facility-administered medications for this visit.     No Known Allergies  Social History   Social History  . Marital status: Widowed    Spouse name: N/A  . Number of children: 3  . Years of education: N/A   Occupational History  . 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 Smoking status: Never  Smoker  . Smokeless tobacco: Never Used  . Alcohol use No  . Drug use: No  . Sexual activity: Not on file   Other Topics Concern  . 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      [id number]  Children are well   Lives independently by herself no pets family daughter  lives close by 8 hours of sleep at least    Negative TAD   14 years of education      Hearing aid and dentures   No falls .  Has smoke detector and wears seat belts. . No excess sun exposure. . No depression             Family History  Problem Relation Age of Onset  . Cancer Mother     intra-abdominal/gallbladder  . Cholelithiasis Mother   . Heart attack Father     62  . Heart disease Father   . Diabetes Sister     67de  . Other  Brother     GSW 65   . Heart attack Sister   . Colon cancer Neg Hx     Review of Systems:  As stated in the HPI and otherwise negative.   BP (!) 162/78   Pulse 78   Ht 5\' 1"  (1.549 m)   Wt 133 lb 1.9 oz (60.4 kg)   SpO2 97%   BMI 25.15 kg/m   Physical Examination: General: Well developed, well nourished, NAD  HEENT: OP clear, mucus membranes moist  SKIN: warm, dry. No rashes. Neuro: No focal deficits  Musculoskeletal: Muscle strength 5/5 all ext  Psychiatric: Mood and affect normal  Neck: No JVD, faint bilateral carotid bruits, no thyromegaly, no lymphadenopathy.  Lungs:Clear bilaterally, no wheezes, rhonci, crackles Cardiovascular: Regular rate and rhythm. No murmurs, gallops or rubs. Abdomen:Soft. Bowel sounds present. Non-tender.  Extremities: No lower extremity edema. Pulses are 1 + in the bilateral DP/PT  EKG:  EKG is  ordered today. The ekg ordered today demonstrates NSR, rate 78 bpm. Incomplete RBBB  Recent Labs: 10/03/2015: TSH 3.87 04/24/2016: ALT 32; BUN 22.1; Creatinine 1.2; HGB 13.2; Platelets 199; Potassium 4.7; Sodium 141   Lipid Panel    Component Value Date/Time   CHOL 114 10/03/2015 1104   TRIG 142.0 10/03/2015 1104   HDL 44.80 10/03/2015 1104   CHOLHDL 3 10/03/2015 1104   VLDL 28.4 10/03/2015 1104   LDLCALC 41 10/03/2015 1104   LDLDIRECT 89.5 08/06/2007 0951     Wt Readings from Last 3 Encounters:  05/29/16 133 lb 1.9 oz (60.4 kg)  05/01/16 131 lb 14.4 oz (59.8 kg)  04/24/16 132 lb 9.6 oz (60.1 kg)     Other studies Reviewed: Additional studies/ records that were reviewed today include: . Review of the above records demonstrates:     Assessment and Plan:   1. CAD without angina: No chest pain suggestive of angina. s/p CABG in 2004. Lexiscan stress myoview January 2016 with no ischemia. She is on good medical therapy. Will continue current meds including ASA/Plavix, Crestor, Toprol, Micardis, Zetia.   2. CAROTID ARTERY DISEASE: She is known  to have bilateral 60-79% stenoses which was stable in June 2017. She has had stable disease for years. She wishes to have her carotids done once yearly so we will arrange for repeat dopplers in June 2018  3. HTN: BP  well controlled at home. Will follow at home.   4. HLD: Lipids have been very well controlled.  Continue statin.   Current medicines are reviewed at length with the patient today.  The patient does not have concerns regarding medicines.  The following changes have been made:  no change  Labs/ tests ordered today include:   Orders Placed This Encounter  Procedures  . EKG 12-Lead    Disposition:   FU with me in 12 months  Signed, Lauree Chandler, MD 05/29/2016 4:28 PM    [facility] [place], [place], [place]  [address] Phone: [phone]; Fax: [phone]

## 2016-06-17 ENCOUNTER — Other Ambulatory Visit: Payer: Self-pay | Admitting: Neurology
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
## 2016-07-02 ENCOUNTER — Ambulatory Visit: Payer: Medicare Other | Admitting: Cardiovascular Disease

## 2016-07-02 ENCOUNTER — Encounter: Payer: Self-pay | Admitting: Neurology

## 2016-07-02 ENCOUNTER — Ambulatory Visit (INDEPENDENT_AMBULATORY_CARE_PROVIDER_SITE_OTHER): Payer: Medicare Other | Admitting: Neurology

## 2016-07-02 VITALS — BP 114/60 | HR 75 | Ht 61.0 in | Wt 133.0 lb

## 2016-07-02 DIAGNOSIS — G609 Hereditary and idiopathic neuropathy, unspecified: Secondary | ICD-10-CM | POA: Diagnosis not present

## 2016-07-02 DIAGNOSIS — F0280 Dementia in other diseases classified elsewhere without behavioral disturbance: Secondary | ICD-10-CM

## 2016-07-02 DIAGNOSIS — D472 Monoclonal gammopathy: Secondary | ICD-10-CM

## 2016-07-02 DIAGNOSIS — I2510 Atherosclerotic heart disease of native coronary artery without angina pectoris: Secondary | ICD-10-CM

## 2016-07-02 DIAGNOSIS — G301 Alzheimer's disease with late onset: Secondary | ICD-10-CM | POA: Diagnosis not present

## 2016-07-02 MED ORDER — QUETIAPINE FUMARATE 25 MG PO TABS: 25.0000 mg | ORAL_TABLET | Freq: Two times a day (BID) | ORAL | 2 refills | Status: DC

## 2016-07-02 NOTE — Addendum Note (Signed)
Addended byAnnamaria Helling on: 07/02/2016 10:19 AM   Modules accepted: Orders

## 2016-07-02 NOTE — Patient Instructions (Signed)
1.   Continue aricept 10 mg daily 2.  Start seroquel (quietapine) - 25 mg - 1/2 tablet at bedtime.  I will call you in 2 weeks to see how you are doing.  I will likely need to increase the dose then.

## 2016-07-07 ENCOUNTER — Ambulatory Visit: Payer: Medicare Other | Admitting: Cardiovascular Disease

## 2016-07-17 ENCOUNTER — Telehealth: Payer: Self-pay | Admitting: Neurology

## 2016-07-17 NOTE — Telephone Encounter (Signed)
Called patient to see how she is doing on Seroquel 12.5 mg at night.  She has not noticed a difference - she will increase to 25 mg at night and I will call in two weeks to see if any change.

## 2016-07-17 NOTE — Telephone Encounter (Signed)
-----   Message from Annamaria Helling, [place] sent at 07/02/2016 10:15 AM EDT ----- Call to see how doing on seroquel Can increase to bid if needed

## 2016-08-06 ENCOUNTER — Telehealth: Payer: Self-pay | Admitting: Neurology

## 2016-08-06 NOTE — Telephone Encounter (Signed)
Spoke with patient's daughter. She states that patient has not noticed a difference in Seroquel 25 mg taking one tablet at night. She has also complained of diarrhea off and on for the past month which is very unusual for her.  Please advise.

## 2016-08-06 NOTE — Telephone Encounter (Signed)
That would be unusual but hold it for a few days.  Then if better have her restart and see if happens again.  Don't want to increase med if it is causing this but I have never had diarrhea with this med.  No new abx?

## 2016-08-07 NOTE — Telephone Encounter (Signed)
No other new meds. No antibiotics.  She will stop Seroquel for a couple days and let us know if this gets better.

## 2016-08-21 ENCOUNTER — Telehealth: Payer: Self-pay | Admitting: Neurology

## 2016-08-21 NOTE — Telephone Encounter (Signed)
Left message for son to call back 

## 2016-08-21 NOTE — Telephone Encounter (Signed)
PT's son called and wanted to update Dr Tat on the medication that Dr Tat had her stop taking but he could not remember the name of the medication

## 2016-09-02 ENCOUNTER — Ambulatory Visit (HOSPITAL_COMMUNITY)
Admission: RE | Admit: 2016-09-02 | Discharge: 2016-09-02 | Disposition: A | Discharge status: Home / Self Care | Payer: Medicare Other | Source: Ambulatory Visit | Attending: Cardiovascular Disease | Admitting: Cardiovascular Disease

## 2016-09-02 DIAGNOSIS — I6523 Occlusion and stenosis of bilateral carotid arteries: Secondary | ICD-10-CM | POA: Diagnosis not present

## 2016-09-02 DIAGNOSIS — I779 Disorder of arteries and arterioles, unspecified: Secondary | ICD-10-CM | POA: Diagnosis not present

## 2016-09-02 DIAGNOSIS — I739 Peripheral vascular disease, unspecified: Secondary | ICD-10-CM

## 2016-09-11 ENCOUNTER — Telehealth: Payer: Self-pay | Admitting: Cardiovascular Disease

## 2016-09-11 NOTE — Telephone Encounter (Signed)
New message   Pt daughter giving Fraser Din a call back

## 2016-09-11 NOTE — Telephone Encounter (Signed)
I placed call to daughter but number is not correct.  I placed call to pt and left message to call back

## 2016-09-18 NOTE — Telephone Encounter (Signed)
I spoke with pt's daughter Fredric Dine) and reviewed carotid doppler results with her.  Will update phone number in chart. Correct number for Fredric Dine is [phone]
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
## 2016-10-02 ENCOUNTER — Ambulatory Visit (INDEPENDENT_AMBULATORY_CARE_PROVIDER_SITE_OTHER): Payer: Medicare Other | Admitting: Neurology

## 2016-10-02 ENCOUNTER — Encounter: Payer: Self-pay | Admitting: Neurology

## 2016-10-02 VITALS — BP 110/60 | HR 64 | Ht 61.0 in | Wt 133.0 lb

## 2016-10-02 DIAGNOSIS — F0280 Dementia in other diseases classified elsewhere without behavioral disturbance: Secondary | ICD-10-CM

## 2016-10-02 DIAGNOSIS — I2510 Atherosclerotic heart disease of native coronary artery without angina pectoris: Secondary | ICD-10-CM | POA: Diagnosis not present

## 2016-10-02 DIAGNOSIS — G301 Alzheimer's disease with late onset: Secondary | ICD-10-CM | POA: Diagnosis not present

## 2016-10-02 DIAGNOSIS — R441 Visual hallucinations: Secondary | ICD-10-CM | POA: Diagnosis not present

## 2016-10-02 DIAGNOSIS — K521 Toxic gastroenteritis and colitis: Secondary | ICD-10-CM

## 2016-10-02 NOTE — Patient Instructions (Signed)
Hold donepezil Will see you on august 15

## 2016-10-06 NOTE — Progress Notes (Signed)
Chief Complaint  Patient presents with  . Annual Exam    HPI: Audrey [name] 83 y.o. come in for Chronic disease management  Yearly check medicare here with family member   Followed by cardiology for  Ht hls aaaa   Dementia" with visual hallucinatinos seeing her dead husband   and renal disease and   Hem consult for  mgus felt to be early and not sig at this time   To follow  Most recnetly problematic   Diarrhea   Dr TAT took her off med aricept  .  For a week   And so far ok   When gets problem it is about once a week for a whol;e day and hard to get to br  Every other lasta day .  Really loose  No blood  No cramping pain .  Taking immodium ad .  Normal bm  every 3 day or so .  No fever. No vomiting city water.  No pets  .    Lives alone   Selinda Eon own bills  Sometimes  Some with daughter Cooking  hsa caretaker comein 3 x per week to help with [place] water  Hx colon surgery ?and told no need for further  Need for colonoscopy after the last one   Walker cane not using .  But had imbalance days   No recnet falls Lives alone but gets taken out to dinner and food brought to her  Family thinks her memory is very good and sharp as a tack  No falls.  Pepsi  . 1-2 per day sugar when one told her that  Art sweeteners were less healthy than sugar sodas  Alcohol no   ROS: See pertinent positives and negatives per HPI. No cp sob noe vison change  Uses hearing aids  Still sees dead husband at [place] a child not knowing who that is.   Past Medical History:  Diagnosis Date  . AAA (abdominal aortic aneurysm) (HCC)    small  . Bilateral carotid artery stenosis   . CAD (coronary artery disease)    including LM disease; s/p CABG  . Colon cancer ([place])   . History of chickenpox   . History of colon cancer    s/p resection in 2004  . History of colonoscopy   . HTN (hypertension)   . Hyperlipidemia   . OA (osteoarthritis)    severe; s/p bilateral hip replacement    Family History  Problem  Relation Age of Onset  . Cancer Mother        intra-abdominal/gallbladder  . Cholelithiasis Mother   . Heart attack Father        67  . Heart disease Father   . Diabetes Sister        2de  . Other Brother        GSW 24   . Heart attack Sister   . Colon cancer Neg Hx     Social History   Social History  . Marital status: Widowed    Spouse name: N/A  . Number of children: 3  . Years of education: N/A   Occupational History  . 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 Smoking status: Never Smoker  . Smokeless tobacco: Never Used  . Alcohol use No  . Drug use: No  . Sexual activity: Not Asked   Other Topics Concern  .  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      [id number]  Children are well   Lives independently by herself no pets family daughter  lives close by 8 hours of sleep at least    Negative TAD   14 years of education      Hearing aid and dentures   No falls .  Has smoke detector and wears seat belts. . No excess sun exposure. . No depression             Outpatient Medications Prior to Visit  Medication Sig Dispense Refill  . aspirin (ADULT ASPIRIN EC LOW STRENGTH) 81 MG EC tablet Take 81 mg by mouth daily.      . Cholecalciferol (VITAMIN D3) 2000 units TABS Take 2,000 Units by mouth 2 (two) times daily.    . clopidogrel (PLAVIX) 75 MG tablet Take 1 tablet (75 mg total) by mouth daily. 90 tablet 3  . ezetimibe (ZETIA) 10 MG tablet Take 1 tablet (10 mg total) by mouth daily. 90 tablet 3  . ibuprofen (ADVIL,MOTRIN) 200 MG tablet Take 400 mg by mouth every 6 (six) hours as needed for mild pain.    . metoprolol succinate (TOPROL-XL) 50 MG 24 hr tablet Take 1 tablet (50 mg total) by mouth daily. 90 tablet 3  . Multiple Vitamins-Minerals (ALIVE WOMENS 50+ PO) Take 1 tablet by mouth daily.      . rosuvastatin (CRESTOR) 40 MG tablet Take 40 mg by mouth every evening.    Marland Kitchen telmisartan (MICARDIS) 20 MG tablet Take 1 tablet (20 mg total) by mouth daily. 90 tablet 3  . vitamin C (ASCORBIC ACID) 500 MG tablet Take 500 mg by mouth daily.     No facility-administered medications prior to visit.      EXAM:  BP 96/70 (BP Location: Right Arm, Patient Position: Sitting, Cuff Size: Normal)   Pulse 78   Ht 4' 8.75" (1.441 m)   Wt 132 lb (59.9 kg)   BMI 28.82 kg/m   Body mass index is 28.82 kg/m.  GENERAL: vitals reviewed and listed above, alert, oriented, appears well hydrated and in no acute distress minimal help to get up on exam table  To steady  HEENT: atraumatic, conjunctiva  clear, no obvious abnormalities on inspection of external nose and ears OP : no lesion edema or exudate  NECK: no obvious masses on inspection palpation  LUNGS: clear to auscultation bilaterally, no wheezes, rales or rhonchi, good air movement Abdomen:  Sof,t normal bowel sounds without hepatosplenomegaly, no guarding rebound or masses no CVA tenderness CV: HRRR, no clubbing cyanosis or  peripheral edema nl cap refill  MS: moves all extremities without noticeable focal  Abnormality djd changes  PSYCH: pleasant and cooperative, no obvious depression or anxiety neuro no tremors  Lab Results  Component Value Date   WBC 6.2 10/07/2016   HGB 13.4 10/07/2016   HCT 39.7 10/07/2016   PLT 195.0 10/07/2016   GLUCOSE 138 (H) 10/07/2016   CHOL 114 10/03/2015   TRIG 142.0 10/03/2015   HDL 44.80 10/03/2015   LDLDIRECT 89.5 08/06/2007   LDLCALC 41 10/03/2015   ALT 25 10/07/2016   AST 25 10/07/2016   NA 139 10/07/2016   K 4.6 10/07/2016   CL 105 10/07/2016   CREATININE 1.34 (H) 10/07/2016   BUN 32 (H) 10/07/2016   CO2 25 10/07/2016  TSH 4.21 10/07/2016   HGBA1C 6.0 10/07/2016   BP Readings from Last 3 Encounters:  10/07/16 96/70  10/02/16 110/60  07/02/16 114/60    ASSESSMENT AND PLAN:  Discussed  the following assessment and plan:  Diarrhea, unspecified type - intermittent but problematic poss med related will follow lab today - Plan: CBC with Differential/Platelet, Basic metabolic panel, Hepatic function panel, TSH, T4, free, Hemoglobin A1c  Medication management - Plan: CBC with Differential/Platelet, Basic metabolic panel, Hepatic function panel, TSH, T4, free, Hemoglobin A1c  Unsteadiness - at times - Plan: CBC with Differential/Platelet, Basic metabolic panel, Hepatic function panel, TSH, T4, free, Hemoglobin A1c  Fasting hyperglycemia - Plan: CBC with Differential/Platelet, Basic metabolic panel, Hepatic function panel, TSH, T4, free, Hemoglobin A1c  Wears hearing aid  Advanced age  Essential hypertension  CKD (chronic kidney disease), stage IV (HCC)  Osteoporosis without current pathological fracture, unspecified osteoporosis type - not currently interested in medication bone building therapy  MGUS (monoclonal gammopathy of unknown significance) Counseled saftety fall prevention  Etc    Will track bowel habits  And fu if persistent .  -Patient advised to return or notify health care team  if  new concerns arise.  Patient Instructions  Stay off the medication for another few weeks before we decide to do more evaluation. Monitor the days of diarrhea. Contact us immediately if you have fever series pain or blood. Lab tests today to include chemistries blood count and liver panel and thyroid blood sugar  let you know  when the results are back. Consider new or shingles vaccine.  There are many medicines that could cause diarrhea but usually those are on a daily basis. Fu if continuing.   Or depending on labs        Fall Prevention in the [place] can cause injuries and can affect people from all age groups. There are many simple things that you can do to make your home safe and to help prevent falls. What can I do on the outside of my home?  Regularly repair  the edges of walkways and driveways and fix any cracks.  Remove high doorway thresholds.  Trim any shrubbery on the main path into your home.  Use bright outdoor lighting.  Clear walkways of debris and clutter, including tools and rocks.  Regularly check that handrails are securely fastened and in good repair. Both sides of any steps should have handrails.  Install guardrails along the edges of any raised decks or porches.  Have leaves, snow, and ice cleared regularly.  Use sand or salt on walkways during winter months.  In the garage, clean up any spills right away, including grease or oil spills. What can I do in the bathroom?  Use night lights.  Install grab bars by the toilet and in the tub and shower. Do not use towel bars as grab bars.  Use non-skid mats or decals on the floor of the tub or shower.  If you need to sit down while you are in the shower, use a plastic, non-slip stool.  Keep the floor dry. Immediately clean up any water that spills on the floor.  Remove soap buildup in the tub or shower on a regular basis.  Attach bath mats securely with double-sided non-slip rug tape.  Remove throw rugs and other tripping hazards from the floor. What can I do in the bedroom?  Use night lights.  Make sure that a bedside light is easy to reach.  Do not  use oversized bedding that drapes onto the floor.  Have a firm chair that has side arms to use for getting dressed.  Remove throw rugs and other tripping hazards from the floor. What can I do in the kitchen?  Clean up any spills right away.  Avoid walking on wet floors.  Place frequently used items in easy-to-reach places.  If you need to reach for something above you, use a sturdy step stool that has a grab bar.  Keep electrical cables out of the way.  Do not use floor polish or wax that makes floors slippery. If you have to use wax, make sure that it is non-skid floor wax.  Remove throw rugs and other  tripping hazards from the floor. What can I do in the stairways?  Do not leave any items on the stairs.  Make sure that there are handrails on both sides of the stairs. Fix handrails that are broken or loose. Make sure that handrails are as long as the stairways.  Check any carpeting to make sure that it is firmly attached to the stairs. Fix any carpet that is loose or worn.  Avoid having throw rugs at the top or bottom of stairways, or secure the rugs with carpet tape to prevent them from moving.  Make sure that you have a light switch at the top of the stairs and the bottom of the stairs. If you do not have them, have them installed. What are some other fall prevention tips?  Wear closed-toe shoes that fit well and support your feet. Wear shoes that have rubber soles or low heels.  When you use a stepladder, make sure that it is completely opened and that the sides are firmly locked. Have someone hold the ladder while you are using it. Do not climb a closed stepladder.  Add color or contrast paint or tape to grab bars and handrails in your home. Place contrasting color strips on the first and last steps.  Use mobility aids as needed, such as canes, walkers, scooters, and crutches.  Turn on lights if it is dark. Replace any light bulbs that burn out.  Set up furniture so that there are clear paths. Keep the furniture in the same spot.  Fix any uneven floor surfaces.  Choose a carpet design that does not hide the edge of steps of a stairway.  Be aware of any and all pets.  Review your medicines with your healthcare provider. Some medicines can cause dizziness or changes in blood pressure, which increase your risk of falling. Talk with your health care provider about other ways that you can decrease your risk of falls. This may include working with a physical therapist or trainer to improve your strength, balance, and endurance. This information is not intended to replace advice  given to you by your health care provider. Make sure you discuss any questions you have with your health care provider. Document Released: 02/28/2002 Document Revised: 08/07/2015 Document Reviewed: 04/14/2014 Elsevier Interactive Patient Education  2017 [place] protect organs, store calcium, and anchor muscles. Good health habits, such as eating nutritious foods and exercising regularly, are important for maintaining healthy bones. They can also help to prevent a condition that causes bones to lose density and become weak and brittle (osteoporosis). Why is bone mass important? Bone mass refers to the amount of bone tissue that you have. The higher your bone mass, the stronger your bones. An important step toward having healthy  bones throughout life is to have strong and dense bones during childhood. A young adult who has a high bone mass is more likely to have a high bone mass later in life. Bone mass at its greatest it is called peak bone mass. A large decline in bone mass occurs in older adults. In women, it occurs about the time of menopause. During this time, it is important to practice good health habits, because if more bone is lost than what is replaced, the bones will become less healthy and more likely to break (fracture). If you find that you have a low bone mass, you may be able to prevent osteoporosis or further bone loss by changing your diet and lifestyle. How can I find out if my bone mass is low? Bone mass can be measured with an X-ray test that is called a bone mineral density (BMD) test. This test is recommended for all women who are age 84 or older. It may also be recommended for men who are age 52 or older, or for people who are more likely to develop osteoporosis due to:  Having bones that break easily.  Having a long-term disease that weakens bones, such as kidney disease or rheumatoid arthritis.  Having menopause earlier than normal.  Taking  medicine that weakens bones, such as steroids, thyroid hormones, or hormone treatment for breast cancer or prostate cancer.  Smoking.  Drinking three or more alcoholic drinks each day.  What are the nutritional recommendations for healthy bones? To have healthy bones, you need to get enough of the right minerals and vitamins. Most nutrition experts recommend getting these nutrients from the foods that you eat. Nutritional recommendations vary from person to person. Ask your health care provider what is healthy for you. Here are some general guidelines. Calcium Recommendations Calcium is the most important (essential) mineral for bone health. Most people can get enough calcium from their diet, but supplements may be recommended for people who are at risk for osteoporosis. Good sources of calcium include:  Dairy products, such as low-fat or nonfat milk, cheese, and yogurt.  Dark green leafy vegetables, such as bok choy and broccoli.  Calcium-fortified foods, such as orange juice, cereal, bread, soy beverages, and tofu products.  Nuts, such as almonds.  Follow these recommended amounts for daily calcium intake:  Children, age 54?3: 700 mg.  Children, age 9?8: 1,000 mg.  Children, age 546?13: 1,300 mg.  Teens, age 97?18: 1,300 mg.  Adults, age 54?50: 1,000 mg.  Adults, age 549?70: ? Men: 1,000 mg. ? Women: 1,200 mg.  Adults, age 87 or older: 1,200 mg.  Pregnant and breastfeeding females: ? Teens: 1,300 mg. ? Adults: 1,000 mg.  Vitamin D Recommendations Vitamin D is the most essential vitamin for bone health. It helps the body to absorb calcium. Sunlight stimulates the skin to make vitamin D, so be sure to get enough sunlight. If you live in a cold climate or you do not get outside often, your health care provider may recommend that you take vitamin D supplements. Good sources of vitamin D in your diet include:  Egg yolks.  Saltwater fish.  Milk and cereal fortified with  vitamin D.  Follow these recommended amounts for daily vitamin D intake:  Children and teens, age 75?18: 55 international units.  Adults, age 75 or younger: 400-800 international units.  Adults, age 96 or older: 800-1,000 international units.  Other Nutrients Other nutrients for bone health include:  Phosphorus. This mineral is found in  meat, poultry, dairy foods, nuts, and legumes. The recommended daily intake for adult men and adult women is 700 mg.  Magnesium. This mineral is found in seeds, nuts, dark green vegetables, and legumes. The recommended daily intake for adult men is 400?420 mg. For adult women, it is 310?320 mg.  Vitamin K. This vitamin is found in green leafy vegetables. The recommended daily intake is 120 mg for adult men and 90 mg for adult women.  What type of physical activity is best for building and maintaining healthy bones? Weight-bearing and strength-building activities are important for building and maintaining peak bone mass. Weight-bearing activities cause muscles and bones to work against gravity. Strength-building activities increases muscle strength that supports bones. Weight-bearing and muscle-building activities include:  Walking and hiking.  Jogging and running.  Dancing.  Gym exercises.  Lifting weights.  Tennis and racquetball.  Climbing stairs.  Aerobics.  Adults should get at least 30 minutes of moderate physical activity on most days. Children should get at least 60 minutes of moderate physical activity on most days. Ask your health care provide what type of exercise is best for you. Where can I find more information? For more information, check out the following websites:  [place]: YardHomes.se  [employer] of Health: http://www.niams.AnonymousEar.fr.asp  This information is not intended to replace advice given to you by your health care  provider. Make sure you discuss any questions you have with your health care provider. Document Released: 05/31/2003 Document Revised: 09/28/2015 Document Reviewed: 03/15/2014 Elsevier Interactive Patient Education  2018 [place]. Michalla Ringer M.D.

## 2016-10-07 ENCOUNTER — Ambulatory Visit (INDEPENDENT_AMBULATORY_CARE_PROVIDER_SITE_OTHER): Payer: Medicare Other | Admitting: Internal Medicine

## 2016-10-07 ENCOUNTER — Encounter: Payer: Self-pay | Admitting: Internal Medicine

## 2016-10-07 VITALS — BP 96/70 | HR 78 | Ht <= 58 in | Wt 132.0 lb

## 2016-10-07 DIAGNOSIS — M810 Age-related osteoporosis without current pathological fracture: Secondary | ICD-10-CM | POA: Diagnosis not present

## 2016-10-07 DIAGNOSIS — R54 Age-related physical debility: Secondary | ICD-10-CM

## 2016-10-07 DIAGNOSIS — I10 Essential (primary) hypertension: Secondary | ICD-10-CM | POA: Diagnosis not present

## 2016-10-07 DIAGNOSIS — D472 Monoclonal gammopathy: Secondary | ICD-10-CM

## 2016-10-07 DIAGNOSIS — N184 Chronic kidney disease, stage 4 (severe): Secondary | ICD-10-CM

## 2016-10-07 DIAGNOSIS — Z79899 Other long term (current) drug therapy: Secondary | ICD-10-CM | POA: Diagnosis not present

## 2016-10-07 DIAGNOSIS — R2681 Unsteadiness on feet: Secondary | ICD-10-CM

## 2016-10-07 DIAGNOSIS — R197 Diarrhea, unspecified: Secondary | ICD-10-CM | POA: Diagnosis not present

## 2016-10-07 DIAGNOSIS — R7301 Impaired fasting glucose: Secondary | ICD-10-CM

## 2016-10-07 DIAGNOSIS — I2510 Atherosclerotic heart disease of native coronary artery without angina pectoris: Secondary | ICD-10-CM | POA: Diagnosis not present

## 2016-10-07 DIAGNOSIS — Z974 Presence of external hearing-aid: Secondary | ICD-10-CM | POA: Diagnosis not present

## 2016-10-07 LAB — CBC WITH DIFFERENTIAL/PLATELET
Basophils Absolute: 0 10*3/uL (ref 0.0–0.1)
Basophils Relative: 0.4 % (ref 0.0–3.0)
Eosinophils Absolute: 0 10*3/uL (ref 0.0–0.7)
Eosinophils Relative: 0.7 % (ref 0.0–5.0)
HCT: 39.7 % (ref 36.0–46.0)
HEMOGLOBIN: 13.4 g/dL (ref 12.0–15.0)
LYMPHS ABS: 1.1 10*3/uL (ref 0.7–4.0)
Lymphocytes Relative: 18.1 % (ref 12.0–46.0)
MCHC: 33.7 g/dL (ref 30.0–36.0)
MCV: 86.3 fl (ref 78.0–100.0)
MONOS PCT: 6.9 % (ref 3.0–12.0)
Monocytes Absolute: 0.4 10*3/uL (ref 0.1–1.0)
NEUTROS PCT: 73.9 % (ref 43.0–77.0)
Neutro Abs: 4.6 10*3/uL (ref 1.4–7.7)
Platelets: 195 10*3/uL (ref 150.0–400.0)
RBC: 4.6 Mil/uL (ref 3.87–5.11)
RDW: 16.3 % — ABNORMAL HIGH (ref 11.5–15.5)
WBC: 6.2 10*3/uL (ref 4.0–10.5)

## 2016-10-07 LAB — T4, FREE: Free T4: 0.95 ng/dL (ref 0.60–1.60)

## 2016-10-07 LAB — BASIC METABOLIC PANEL
BUN: 32 mg/dL — ABNORMAL HIGH (ref 6–23)
CHLORIDE: 105 meq/L (ref 96–112)
CO2: 25 meq/L (ref 19–32)
CREATININE: 1.34 mg/dL — AB (ref 0.40–1.20)
Calcium: 9.6 mg/dL (ref 8.4–10.5)
GFR: 39.68 mL/min — ABNORMAL LOW (ref >60.00)
GLUCOSE: 138 mg/dL — AB (ref 70–99)
Potassium: 4.6 mEq/L (ref 3.5–5.1)
Sodium: 139 mEq/L (ref 135–145)

## 2016-10-07 LAB — HEPATIC FUNCTION PANEL
ALBUMIN: 4.2 g/dL (ref 3.5–5.2)
ALK PHOS: 83 U/L (ref 39–117)
ALT: 25 U/L (ref 0–35)
AST: 25 U/L (ref 0–37)
BILIRUBIN DIRECT: 0.2 mg/dL (ref 0.0–0.3)
TOTAL PROTEIN: 6.1 g/dL (ref 6.0–8.3)
Total Bilirubin: 1.1 mg/dL (ref 0.2–1.2)

## 2016-10-07 LAB — TSH: TSH: 4.21 u[IU]/mL (ref 0.35–4.50)

## 2016-10-07 LAB — HEMOGLOBIN A1C: Hgb A1c MFr Bld: 6 % (ref 4.6–6.5)

## 2016-10-07 NOTE — Patient Instructions (Addendum)
Stay off the medication for another few weeks before we decide to do more evaluation. Monitor the days of diarrhea. Contact us immediately if you have fever series pain or blood. Lab tests today to include chemistries blood count and liver panel and thyroid blood sugar  let you know  when the results are back. Consider new or shingles vaccine.  There are many medicines that could cause diarrhea but usually those are on a daily basis. Fu if continuing.   Or depending on labs        Fall Prevention in the [place] can cause injuries and can affect people from all age groups. There are many simple things that you can do to make your home safe and to help prevent falls. What can I do on the outside of my home?  Regularly repair the edges of walkways and driveways and fix any cracks.  Remove high doorway thresholds.  Trim any shrubbery on the main path into your home.  Use bright outdoor lighting.  Clear walkways of debris and clutter, including tools and rocks.  Regularly check that handrails are securely fastened and in good repair. Both sides of any steps should have handrails.  Install guardrails along the edges of any raised decks or porches.  Have leaves, snow, and ice cleared regularly.  Use sand or salt on walkways during winter months.  In the garage, clean up any spills right away, including grease or oil spills. What can I do in the bathroom?  Use night lights.  Install grab bars by the toilet and in the tub and shower. Do not use towel bars as grab bars.  Use non-skid mats or decals on the floor of the tub or shower.  If you need to sit down while you are in the shower, use a plastic, non-slip stool.  Keep the floor dry. Immediately clean up any water that spills on the floor.  Remove soap buildup in the tub or shower on a regular basis.  Attach bath mats securely with double-sided non-slip rug tape.  Remove throw rugs and other tripping hazards from the  floor. What can I do in the bedroom?  Use night lights.  Make sure that a bedside light is easy to reach.  Do not use oversized bedding that drapes onto the floor.  Have a firm chair that has side arms to use for getting dressed.  Remove throw rugs and other tripping hazards from the floor. What can I do in the kitchen?  Clean up any spills right away.  Avoid walking on wet floors.  Place frequently used items in easy-to-reach places.  If you need to reach for something above you, use a sturdy step stool that has a grab bar.  Keep electrical cables out of the way.  Do not use floor polish or wax that makes floors slippery. If you have to use wax, make sure that it is non-skid floor wax.  Remove throw rugs and other tripping hazards from the floor. What can I do in the stairways?  Do not leave any items on the stairs.  Make sure that there are handrails on both sides of the stairs. Fix handrails that are broken or loose. Make sure that handrails are as long as the stairways.  Check any carpeting to make sure that it is firmly attached to the stairs. Fix any carpet that is loose or worn.  Avoid having throw rugs at the top or bottom of stairways, or secure the rugs  with carpet tape to prevent them from moving.  Make sure that you have a light switch at the top of the stairs and the bottom of the stairs. If you do not have them, have them installed. What are some other fall prevention tips?  Wear closed-toe shoes that fit well and support your feet. Wear shoes that have rubber soles or low heels.  When you use a stepladder, make sure that it is completely opened and that the sides are firmly locked. Have someone hold the ladder while you are using it. Do not climb a closed stepladder.  Add color or contrast paint or tape to grab bars and handrails in your home. Place contrasting color strips on the first and last steps.  Use mobility aids as needed, such as canes, walkers,  scooters, and crutches.  Turn on lights if it is dark. Replace any light bulbs that burn out.  Set up furniture so that there are clear paths. Keep the furniture in the same spot.  Fix any uneven floor surfaces.  Choose a carpet design that does not hide the edge of steps of a stairway.  Be aware of any and all pets.  Review your medicines with your healthcare provider. Some medicines can cause dizziness or changes in blood pressure, which increase your risk of falling. Talk with your health care provider about other ways that you can decrease your risk of falls. This may include working with a physical therapist or trainer to improve your strength, balance, and endurance. This information is not intended to replace advice given to you by your health care provider. Make sure you discuss any questions you have with your health care provider. Document Released: 02/28/2002 Document Revised: 08/07/2015 Document Reviewed: 04/14/2014 Elsevier Interactive Patient Education  2017 [place] protect organs, store calcium, and anchor muscles. Good health habits, such as eating nutritious foods and exercising regularly, are important for maintaining healthy bones. They can also help to prevent a condition that causes bones to lose density and become weak and brittle (osteoporosis). Why is bone mass important? Bone mass refers to the amount of bone tissue that you have. The higher your bone mass, the stronger your bones. An important step toward having healthy bones throughout life is to have strong and dense bones during childhood. A young adult who has a high bone mass is more likely to have a high bone mass later in life. Bone mass at its greatest it is called peak bone mass. A large decline in bone mass occurs in older adults. In women, it occurs about the time of menopause. During this time, it is important to practice good health habits, because if more bone is lost than what  is replaced, the bones will become less healthy and more likely to break (fracture). If you find that you have a low bone mass, you may be able to prevent osteoporosis or further bone loss by changing your diet and lifestyle. How can I find out if my bone mass is low? Bone mass can be measured with an X-ray test that is called a bone mineral density (BMD) test. This test is recommended for all women who are age 67 or older. It may also be recommended for men who are age 37 or older, or for people who are more likely to develop osteoporosis due to:  Having bones that break easily.  Having a long-term disease that weakens bones, such as kidney disease or rheumatoid arthritis.  Having menopause earlier than normal.  Taking medicine that weakens bones, such as steroids, thyroid hormones, or hormone treatment for breast cancer or prostate cancer.  Smoking.  Drinking three or more alcoholic drinks each day.  What are the nutritional recommendations for healthy bones? To have healthy bones, you need to get enough of the right minerals and vitamins. Most nutrition experts recommend getting these nutrients from the foods that you eat. Nutritional recommendations vary from person to person. Ask your health care provider what is healthy for you. Here are some general guidelines. Calcium Recommendations Calcium is the most important (essential) mineral for bone health. Most people can get enough calcium from their diet, but supplements may be recommended for people who are at risk for osteoporosis. Good sources of calcium include:  Dairy products, such as low-fat or nonfat milk, cheese, and yogurt.  Dark green leafy vegetables, such as bok choy and broccoli.  Calcium-fortified foods, such as orange juice, cereal, bread, soy beverages, and tofu products.  Nuts, such as almonds.  Follow these recommended amounts for daily calcium intake:  Children, age 73?3: 700 mg.  Children, age 43?8: 1,000  mg.  Children, age 28?13: 1,300 mg.  Teens, age 35?18: 1,300 mg.  Adults, age 24?50: 1,000 mg.  Adults, age 26?70: ? Men: 1,000 mg. ? Women: 1,200 mg.  Adults, age 731 or older: 1,200 mg.  Pregnant and breastfeeding females: ? Teens: 1,300 mg. ? Adults: 1,000 mg.  Vitamin D Recommendations Vitamin D is the most essential vitamin for bone health. It helps the body to absorb calcium. Sunlight stimulates the skin to make vitamin D, so be sure to get enough sunlight. If you live in a cold climate or you do not get outside often, your health care provider may recommend that you take vitamin D supplements. Good sources of vitamin D in your diet include:  Egg yolks.  Saltwater fish.  Milk and cereal fortified with vitamin D.  Follow these recommended amounts for daily vitamin D intake:  Children and teens, age 80?18: 32 international units.  Adults, age 439 or younger: 400-800 international units.  Adults, age 31 or older: 800-1,000 international units.  Other Nutrients Other nutrients for bone health include:  Phosphorus. This mineral is found in meat, poultry, dairy foods, nuts, and legumes. The recommended daily intake for adult men and adult women is 700 mg.  Magnesium. This mineral is found in seeds, nuts, dark green vegetables, and legumes. The recommended daily intake for adult men is 400?420 mg. For adult women, it is 310?320 mg.  Vitamin K. This vitamin is found in green leafy vegetables. The recommended daily intake is 120 mg for adult men and 90 mg for adult women.  What type of physical activity is best for building and maintaining healthy bones? Weight-bearing and strength-building activities are important for building and maintaining peak bone mass. Weight-bearing activities cause muscles and bones to work against gravity. Strength-building activities increases muscle strength that supports bones. Weight-bearing and muscle-building activities include:  Walking and  hiking.  Jogging and running.  Dancing.  Gym exercises.  Lifting weights.  Tennis and racquetball.  Climbing stairs.  Aerobics.  Adults should get at least 30 minutes of moderate physical activity on most days. Children should get at least 60 minutes of moderate physical activity on most days. Ask your health care provide what type of exercise is best for you. Where can I find more information? For more information, check out the following websites:  National Osteoporosis  Foundation: YardHomes.se  [employer] of Health: http://www.niams.AnonymousEar.fr.asp  This information is not intended to replace advice given to you by your health care provider. Make sure you discuss any questions you have with your health care provider. Document Released: 05/31/2003 Document Revised: 09/28/2015 Document Reviewed: 03/15/2014 Elsevier Interactive Patient Education  [employer].
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
## 2016-11-05 ENCOUNTER — Encounter: Payer: Self-pay | Admitting: Neurology

## 2016-11-05 ENCOUNTER — Ambulatory Visit (INDEPENDENT_AMBULATORY_CARE_PROVIDER_SITE_OTHER): Payer: Medicare Other | Admitting: Neurology

## 2016-11-05 VITALS — BP 130/70 | HR 78 | Ht 61.0 in | Wt 132.0 lb

## 2016-11-05 DIAGNOSIS — G301 Alzheimer's disease with late onset: Secondary | ICD-10-CM

## 2016-11-05 DIAGNOSIS — F0280 Dementia in other diseases classified elsewhere without behavioral disturbance: Secondary | ICD-10-CM | POA: Diagnosis not present

## 2016-11-05 DIAGNOSIS — R441 Visual hallucinations: Secondary | ICD-10-CM | POA: Diagnosis not present

## 2016-11-05 DIAGNOSIS — I2510 Atherosclerotic heart disease of native coronary artery without angina pectoris: Secondary | ICD-10-CM | POA: Diagnosis not present

## 2016-11-05 MED ORDER — QUETIAPINE FUMARATE 25 MG PO TABS: 25.0000 mg | ORAL_TABLET | Freq: Every day | ORAL | 1 refills | Status: DC

## 2016-11-05 NOTE — Patient Instructions (Signed)
Restart quetiapine (also called seroquel), 25mg  - 1/2 tablet at night for a week and then increase to 25 mg at night and we will call you in 2 weeks to see how you are doing.  Call me with any problems in the meantime.  Make a follow up appointment in 3 months, sooner should you need me

## 2016-11-26 ENCOUNTER — Telehealth: Payer: Self-pay | Admitting: Cardiovascular Disease

## 2016-11-26 MED ORDER — METOPROLOL SUCCINATE ER 50 MG PO TB24: 50.0000 mg | ORAL_TABLET | Freq: Every day | ORAL | 3 refills | Status: DC

## 2016-11-26 MED ORDER — EZETIMIBE 10 MG PO TABS: 10.0000 mg | ORAL_TABLET | Freq: Every day | ORAL | 3 refills | Status: DC

## 2016-11-26 MED ORDER — CLOPIDOGREL BISULFATE 75 MG PO TABS: 75.0000 mg | ORAL_TABLET | Freq: Every day | ORAL | 3 refills | Status: DC

## 2016-11-26 MED ORDER — TELMISARTAN 20 MG PO TABS: 20.0000 mg | ORAL_TABLET | Freq: Every day | ORAL | 3 refills | Status: DC

## 2016-11-26 MED ORDER — ROSUVASTATIN CALCIUM 40 MG PO TABS: 40.0000 mg | ORAL_TABLET | Freq: Every evening | ORAL | 3 refills | Status: DC

## 2016-11-26 NOTE — Telephone Encounter (Signed)
Neoma Laming( Daughter) is calling to get copies of her prescriptions to mail to the [place] . Please call   Thanks

## 2016-11-26 NOTE — Telephone Encounter (Signed)
I spoke with pt's daughter. She is requesting printed copy of prescriptions.  Will send these to pt's address per daughter's request.

## 2016-12-03 ENCOUNTER — Encounter: Payer: Self-pay | Admitting: Internal Medicine

## 2016-12-03 ENCOUNTER — Ambulatory Visit (INDEPENDENT_AMBULATORY_CARE_PROVIDER_SITE_OTHER): Payer: Medicare Other | Admitting: Internal Medicine

## 2016-12-03 VITALS — BP 90/70 | HR 80 | Temp 97.6°F | Wt 133.5 lb

## 2016-12-03 DIAGNOSIS — Z974 Presence of external hearing-aid: Secondary | ICD-10-CM

## 2016-12-03 DIAGNOSIS — H6120 Impacted cerumen, unspecified ear: Secondary | ICD-10-CM | POA: Diagnosis not present

## 2016-12-03 DIAGNOSIS — H9193 Unspecified hearing loss, bilateral: Secondary | ICD-10-CM

## 2016-12-03 DIAGNOSIS — I2510 Atherosclerotic heart disease of native coronary artery without angina pectoris: Secondary | ICD-10-CM | POA: Diagnosis not present

## 2016-12-03 NOTE — Patient Instructions (Signed)
   fu with your hearing team as needed when availbale and your neuro  As planned
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
## 2016-12-12 ENCOUNTER — Encounter: Payer: Self-pay | Admitting: Internal Medicine

## 2016-12-19 ENCOUNTER — Telehealth: Payer: Self-pay | Admitting: Cardiovascular Disease

## 2016-12-19 MED ORDER — TELMISARTAN 20 MG PO TABS: 20.0000 mg | ORAL_TABLET | Freq: Every day | ORAL | 3 refills | Status: DC

## 2016-12-19 MED ORDER — CLOPIDOGREL BISULFATE 75 MG PO TABS: 75.0000 mg | ORAL_TABLET | Freq: Every day | ORAL | 3 refills | Status: DC

## 2016-12-19 MED ORDER — EZETIMIBE 10 MG PO TABS: 10.0000 mg | ORAL_TABLET | Freq: Every day | ORAL | 3 refills | Status: DC

## 2016-12-19 MED ORDER — ROSUVASTATIN CALCIUM 40 MG PO TABS: 40.0000 mg | ORAL_TABLET | Freq: Every evening | ORAL | 3 refills | Status: DC

## 2016-12-19 MED ORDER — METOPROLOL SUCCINATE ER 50 MG PO TB24: 50.0000 mg | ORAL_TABLET | Freq: Every day | ORAL | 3 refills | Status: DC

## 2016-12-19 NOTE — Telephone Encounter (Signed)
I spoke with pt's son.  Pt did not receive prescriptions that were mailed to her earlier this month.  Son would like to pick them up in the office.  Will leave at the front desk for pick up.

## 2016-12-19 NOTE — Telephone Encounter (Signed)
New message       *STAT* If patient is at the pharmacy, call can be transferred to refill team.   1. Which medications need to be refilled? (please list name of each medication and dose if known) clopidogrel (PLAVIX) 75 MG tablet ,  ezetimibe (ZETIA) 10 MG tablet,     metoprolol succinate (TOPROL-XL) 50 MG 24 hr tablet,      rosuvastatin (CRESTOR) 40 MG tablet,     telmisartan (MICARDIS) 20 MG tablet  2. Which pharmacy/location (including street and city if local pharmacy) is [facility]  3. Do they need a 30 day or 90 day supply? Garfield (son) states the prescriptions can be mailed  or he can pick them up to take to the [place] to have filled. Please call.

## 2016-12-25 ENCOUNTER — Telehealth: Payer: Self-pay | Admitting: Neurology

## 2016-12-25 NOTE — Telephone Encounter (Signed)
He thinks symptoms got worse after the addition of Seroquel. She has a follow up with Dr. Carles Collet on 01/09/17. Please advise.

## 2016-12-25 NOTE — Telephone Encounter (Signed)
He can reduce seroquel to 12.5mg  at bedtime and see if that makes any difference although it would be unusual for seroquel to exacerbate these symptoms.

## 2016-12-25 NOTE — Telephone Encounter (Signed)
Patient's son made aware. He is aware she may just need more Seroquel, since she is on a low dose and hallucinations/aggression is usually treated with Seroquel. He will try the half dose for a couple weeks to see if symptoms worsen or get better. They will keep appt on Oct 19.

## 2016-12-25 NOTE — Telephone Encounter (Signed)
Tried to call patient's son with no answer and voicemail is full.

## 2016-12-25 NOTE — Telephone Encounter (Signed)
Patient's son came by the office to let Dr. Carles Collet know that his mom is seeming to get worse. She is having more hallucinations and things are getting more aggressive. He also mentioned Cognitive Impairment. Please Call. Thanks
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
## 2017-01-09 ENCOUNTER — Ambulatory Visit (INDEPENDENT_AMBULATORY_CARE_PROVIDER_SITE_OTHER): Payer: Medicare Other | Admitting: Neurology

## 2017-01-09 ENCOUNTER — Encounter: Payer: Self-pay | Admitting: Psychology

## 2017-01-09 ENCOUNTER — Encounter: Payer: Self-pay | Admitting: Neurology

## 2017-01-09 VITALS — BP 108/70 | HR 68 | Ht 60.0 in | Wt 131.0 lb

## 2017-01-09 DIAGNOSIS — I2510 Atherosclerotic heart disease of native coronary artery without angina pectoris: Secondary | ICD-10-CM | POA: Diagnosis not present

## 2017-01-09 DIAGNOSIS — R441 Visual hallucinations: Secondary | ICD-10-CM | POA: Diagnosis not present

## 2017-01-09 DIAGNOSIS — G301 Alzheimer's disease with late onset: Secondary | ICD-10-CM | POA: Diagnosis not present

## 2017-01-09 DIAGNOSIS — F0280 Dementia in other diseases classified elsewhere without behavioral disturbance: Secondary | ICD-10-CM | POA: Diagnosis not present

## 2017-01-09 MED ORDER — TRANSPORT CHAIR MISC: 1.0000 | Freq: Every day | 0 refills | Status: DC

## 2017-01-09 NOTE — Progress Notes (Signed)
Met with the patient and her family. We talked a little bit about how to start to use LTC insurance. The patient will be going back to her home and the family has hired a 24/7 in home care. I encouraged them to contact their LTC insurance plan and let them know the needs that the patient currently has. Most policies require a policyholder to need assistance with at least two (ADLs). Each company and individual policy handles these criteria differently, especially for policyholders with cognitive impairment. If they need advocacy with this, I can connect them with Claris Gower who is very knowledgeable about this and may be able to give them more guidance  I completed a direct connect referral form with the patient, her son and daughter in law for the [employer] to contact them and provide appropriate resources. I also gave them the 24/7 contact card for the [employer].   They have my contact information and can reach out to me at any point of their journey.

## 2017-01-27 ENCOUNTER — Other Ambulatory Visit: Payer: Self-pay | Admitting: *Deleted

## 2017-01-27 MED ORDER — ROSUVASTATIN CALCIUM 40 MG PO TABS: 40.0000 mg | ORAL_TABLET | Freq: Every evening | ORAL | 1 refills | Status: DC

## 2017-01-27 NOTE — Telephone Encounter (Signed)
Patients daughter, Audrey [name] left a msg on the refill vm stating that the patient normally receives her medications from the [place], however they are unavailable at this time. She is requesting a thirty day rx on one of her meds be sent to [facility]. She did not provide the name of the medication that was needed. I called Debbie and she is requesting a refill on rosuvastatin #30 with 1 refill so it will be there in case it is needed.  She was very appreciative for my call.

## 2017-02-05 ENCOUNTER — Ambulatory Visit: Payer: Medicare Other | Admitting: Neurology

## 2017-04-21 ENCOUNTER — Ambulatory Visit: Payer: Medicare Other | Admitting: Internal Medicine

## 2017-04-21 DIAGNOSIS — H353131 Nonexudative age-related macular degeneration, bilateral, early dry stage: Secondary | ICD-10-CM | POA: Diagnosis not present

## 2017-04-21 DIAGNOSIS — H5213 Myopia, bilateral: Secondary | ICD-10-CM | POA: Diagnosis not present

## 2017-04-21 DIAGNOSIS — H04123 Dry eye syndrome of bilateral lacrimal glands: Secondary | ICD-10-CM | POA: Diagnosis not present

## 2017-04-21 DIAGNOSIS — H524 Presbyopia: Secondary | ICD-10-CM | POA: Diagnosis not present

## 2017-04-21 DIAGNOSIS — H52223 Regular astigmatism, bilateral: Secondary | ICD-10-CM | POA: Diagnosis not present

## 2017-04-21 DIAGNOSIS — H26492 Other secondary cataract, left eye: Secondary | ICD-10-CM | POA: Diagnosis not present

## 2017-04-23 ENCOUNTER — Ambulatory Visit: Payer: Medicare Other | Admitting: Neurology

## 2017-04-27 NOTE — Progress Notes (Signed)
Chief Complaint  Patient presents with  . Follow-up    Pt complains of leg cramps at night, would like to know if she can take hylands leg cramps pm. Pt would like to know if she should switch to tylenol from ibuprofen     HPI: Audrey [name] 84 y.o. comes in today for t .Chronic disease management Here with children today.  Dementia per dr TAT .  Did not tolerate to the medicines she still sees things but they are not alarming to her.  No falling there is a caretaker at home.  breathing some with walking no major change in activity status.  Complains of numbness in the right hand thumb and first 2 fingers but does make it hard for her to grip this is been going on for a while does not go up her arm and there is no serious pain.  Is not in the left hand  Has some problems with cramping in vibrating twitching legs at night.  Was taking ibuprofen 200 mg asks about switching to Tylenol.  Sometimes sleep is disrupted wonders if medications would be helpful for sleep but otherwise doing okay.  No unusual bleeding change in bowel habits and no recent falling.  Ibuprofen  for leg pains   Like rls      Health Maintenance  Topic Date Due  . TETANUS/TDAP  10/26/1945  . INFLUENZA VACCINE  06/21/2017 (Originally 10/22/2016)  . PNA vac Low Risk Adult (2 of 2 - PPSV23) 10/02/2048 (Originally 06/21/2015)  . DEXA SCAN  Completed    ROS:  GEN/ HEENT: No fever, significant weight changes sweats headaches CV/ PULM; No chest pain shortness of breath cough, syncope,edema  change in exercise tolerance. GI /GU: No adominal pain, vomiting, change in bowel habits. No blood in the stool. No significant GU symptoms. SKIN/HEME: ,no acute skin rashes suspicious lesions or bleeding. No lymphadenopathy, nodules, masses.  NEURO/ PSYCH: as above  Has attendant at home IMM/ Allergy: No unusual infections.  Allergy .   REST of 12 system review negative except as per HPI   Past Medical History:  Diagnosis Date    . AAA (abdominal aortic aneurysm) (HCC)    small  . Bilateral carotid artery stenosis   . CAD (coronary artery disease)    including LM disease; s/p CABG  . Colon cancer ([place])   . History of chickenpox   . History of colon cancer    s/p resection in 2004  . History of colonoscopy   . HTN (hypertension)   . Hyperlipidemia   . OA (osteoarthritis)    severe; s/p bilateral hip replacement    Family History  Problem Relation Age of Onset  . Cancer Mother        intra-abdominal/gallbladder  . Cholelithiasis Mother   . Heart attack Father        25  . Heart disease Father   . Diabetes Sister        64de  . Other Brother        GSW 39   . Heart attack Sister   . Colon cancer Neg Hx     Social History   Socioeconomic History  . Marital status: Widowed    Spouse name: None  . Number of children: 3  . Years of education: None  . Highest education level: None  Social Needs  . Financial resource strain: None  . Food insecurity - worry: None  . Food insecurity - inability: None  .  Transportation needs - medical: None  . Transportation needs - non-medical: None  Occupational History  . 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 Smoking status: Never Smoker  . Smokeless tobacco: Never Used  Substance and Sexual Activity  . Alcohol use: No  . Drug use: No  . Sexual activity: None  Other Topics Concern  . 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      g3p3  Children are well   Lives independently by herself no pets family daughter  lives close by 8 hours of sleep at least    Negative TAD   14 years of education      Hearing aid and dentures   No falls .  Has smoke detector and wears seat belts. . No excess sun exposure. . No depression          Outpatient Encounter Medications  as of 04/28/2017  Medication Sig  . aspirin (ADULT ASPIRIN EC LOW STRENGTH) 81 MG EC tablet Take 81 mg by mouth daily.    . Cholecalciferol (VITAMIN D3) 2000 units TABS Take 2,000 Units by mouth 2 (two) times daily.  . clopidogrel (PLAVIX) 75 MG tablet Take 1 tablet (75 mg total) by mouth daily.  Marland Kitchen ezetimibe (ZETIA) 10 MG tablet Take 1 tablet (10 mg total) by mouth daily.  Marland Kitchen ibuprofen (ADVIL,MOTRIN) 200 MG tablet Take 400 mg by mouth every 6 (six) hours as needed for mild pain.  . magnesium 30 MG tablet Take 30 mg by mouth at bedtime.  . metoprolol succinate (TOPROL-XL) 50 MG 24 hr tablet Take 1 tablet (50 mg total) by mouth daily.  . Misc. Devices (TRANSPORT CHAIR) MISC 1 Device by Does not apply route daily.  . Multiple Vitamins-Minerals (ALIVE WOMENS 50+ PO) Take 1 tablet by mouth daily.  . rosuvastatin (CRESTOR) 40 MG tablet Take 1 tablet (40 mg total) every evening by mouth.  . telmisartan (MICARDIS) 20 MG tablet Take 1 tablet (20 mg total) by mouth daily.  . vitamin C (ASCORBIC ACID) 500 MG tablet Take 500 mg by mouth daily.   No facility-administered encounter medications on file as of 04/28/2017.     EXAM:  BP 100/60 (BP Location: Right Arm, Patient Position: Sitting, Cuff Size: Normal)   Pulse 72   Temp 97.6 F (36.4 C) (Oral)   Wt 137 lb 9.6 oz (62.4 kg)   BMI 26.87 kg/m   Body mass index is 26.87 kg/m.  Physical Exam: Vital signs reviewed CLE:XNTZ is a well-developed well-nourished alert cooperative   who appears stated age in no acute distress.   Hearing aids   soicalble  Walking with cane foreward   "sight impaired " needs guidance   HEENT: normocephalic atraumatic , Eyes: PERRL EOM's full, conjunctiva clear, Nares: paten,t no deformity discharge or tenderness., NECK: supple without masses, thyromegaly or bruits. CHEST/PULM:  Clear to auscultation and percussion breath sounds equal no wheeze , rales or rhonchi. . CV: PMI is nondisplaced, S1 S2 no gallops, murmurs, rubs.  Peripheral pulses are present .No JVD .   Extremtities:  No clubbing cyanosis or edema, no acute joint swelling or redness no focal atrophy   Nl rom hand    Sensation subjectinge dec right median nerve distribution  djd changes Personable   Conversation  SKIN:  No acute rashes normal turgor, color, no bruising or petechiae.    ASSESSMENT AND PLAN:  Discussed the following assessment and plan:  Medication management - Plan: Vitamin B12, CBC with Differential/Platelet, Basic metabolic panel, IBC panel, Iron, Ferritin, Ferritin, CANCELED: Iron, TIBC and Ferritin Panel  Essential hypertension - Plan: Vitamin B12, CBC with Differential/Platelet, Basic metabolic panel, IBC panel, Iron, Ferritin, Ferritin, CANCELED: Iron, TIBC and Ferritin Panel  CKD (chronic kidney disease), stage IV (HCC) - Plan: Vitamin B12, CBC with Differential/Platelet, Basic metabolic panel, IBC panel, Iron, Ferritin, Ferritin, CANCELED: Iron, TIBC and Ferritin Panel  RLS (restless legs syndrome) - Plan: Vitamin B12, CBC with Differential/Platelet, Basic metabolic panel, IBC panel, Iron, Ferritin, Ferritin, CANCELED: Iron, TIBC and Ferritin Panel  Numbness and tingling in right hand - Plan: Vitamin B12, CBC with Differential/Platelet, Basic metabolic panel, IBC panel, Iron, Ferritin, Ferritin, CANCELED: Iron, TIBC and Ferritin Panel  Seeing things - Plan: IBC panel, Iron, Ferritin, Ferritin Decline   Flu vaccine  Today   Agree with avoiding  NSAID   Uncertain meds for  Sleep and rl risk more than benefit but would get Dr Tat advice  Check for iron b12 and renal function today .   Fu 6 mos  Or as needed .  Depending on lab results  Patient Care Team: Panosh, Standley Brooking, MD as PCP - General (Internal Medicine) Paralee Cancel, MD (Orthopedic Surgery) Bensimhon, Shaune Pascal, MD (Cardiology) Burnell Blanks, MD as Attending Physician (Cardiology) Calvert Cantor, MD as Attending Physician (Ophthalmology) Tat, Eustace Quail, DO  as Consulting Physician (Neurology)  Patient Instructions  Tylenol  Is safer  Than ibuprofen .  Would   Welcome dr Tat  Input for the  Restless leg and sleep issues .  And the numbness of the  Right hand fingers . ? If  CTS or other that can be treated .    Will let you know lab results when back     [place] K. Panosh M.D.  Lab Results  Component Value Date   WBC 5.4 04/28/2017   HGB 12.9 04/28/2017   HCT 37.5 04/28/2017   PLT 190.0 04/28/2017   GLUCOSE 455 (H) 04/28/2017   CHOL 114 10/03/2015   TRIG 142.0 10/03/2015   HDL 44.80 10/03/2015   LDLDIRECT 89.5 08/06/2007   LDLCALC 41 10/03/2015   ALT 25 10/07/2016   AST 25 10/07/2016   NA 133 (L) 04/28/2017   K 5.0 04/28/2017   CL 98 04/28/2017   CREATININE 1.45 (H) 04/28/2017   BUN 30 (H) 04/28/2017   CO2 27 04/28/2017   TSH 4.21 10/07/2016   HGBA1C 6.0 10/07/2016   Unexpected results    Of elevated   Glucose  Will notify pat family and plan repeat and a1c

## 2017-04-28 ENCOUNTER — Ambulatory Visit (INDEPENDENT_AMBULATORY_CARE_PROVIDER_SITE_OTHER): Payer: Medicare Other | Admitting: Internal Medicine

## 2017-04-28 ENCOUNTER — Encounter: Payer: Self-pay | Admitting: Internal Medicine

## 2017-04-28 VITALS — BP 100/60 | HR 72 | Temp 97.6°F | Wt 137.6 lb

## 2017-04-28 DIAGNOSIS — N184 Chronic kidney disease, stage 4 (severe): Secondary | ICD-10-CM

## 2017-04-28 DIAGNOSIS — G2581 Restless legs syndrome: Secondary | ICD-10-CM

## 2017-04-28 DIAGNOSIS — R54 Age-related physical debility: Secondary | ICD-10-CM

## 2017-04-28 DIAGNOSIS — R202 Paresthesia of skin: Secondary | ICD-10-CM

## 2017-04-28 DIAGNOSIS — I10 Essential (primary) hypertension: Secondary | ICD-10-CM

## 2017-04-28 DIAGNOSIS — Z79899 Other long term (current) drug therapy: Secondary | ICD-10-CM | POA: Diagnosis not present

## 2017-04-28 DIAGNOSIS — R200 Anesthesia of skin: Secondary | ICD-10-CM | POA: Diagnosis not present

## 2017-04-28 DIAGNOSIS — R441 Visual hallucinations: Secondary | ICD-10-CM | POA: Diagnosis not present

## 2017-04-28 LAB — BASIC METABOLIC PANEL
BUN: 30 mg/dL — AB (ref 6–23)
CALCIUM: 8.9 mg/dL (ref 8.4–10.5)
CHLORIDE: 98 meq/L (ref 96–112)
CO2: 27 mEq/L (ref 19–32)
CREATININE: 1.45 mg/dL — AB (ref 0.40–1.20)
GFR: 36.18 mL/min — ABNORMAL LOW (ref >60.00)
GLUCOSE: 455 mg/dL — AB (ref 70–99)
Potassium: 5 mEq/L (ref 3.5–5.1)
Sodium: 133 mEq/L — ABNORMAL LOW (ref 135–145)

## 2017-04-28 LAB — CBC WITH DIFFERENTIAL/PLATELET
Basophils Absolute: 0 10*3/uL (ref 0.0–0.1)
Basophils Relative: 0.4 % (ref 0.0–3.0)
EOS PCT: 0.9 % (ref 0.0–5.0)
Eosinophils Absolute: 0.1 10*3/uL (ref 0.0–0.7)
HEMATOCRIT: 37.5 % (ref 36.0–46.0)
Hemoglobin: 12.9 g/dL (ref 12.0–15.0)
LYMPHS ABS: 1.2 10*3/uL (ref 0.7–4.0)
LYMPHS PCT: 22.7 % (ref 12.0–46.0)
MCHC: 34.5 g/dL (ref 30.0–36.0)
MCV: 86 fl (ref 78.0–100.0)
MONOS PCT: 8.8 % (ref 3.0–12.0)
Monocytes Absolute: 0.5 10*3/uL (ref 0.1–1.0)
NEUTROS PCT: 67.2 % (ref 43.0–77.0)
Neutro Abs: 3.6 10*3/uL (ref 1.4–7.7)
Platelets: 190 10*3/uL (ref 150.0–400.0)
RBC: 4.36 Mil/uL (ref 3.87–5.11)
RDW: 15.5 % (ref 11.5–15.5)
WBC: 5.4 10*3/uL (ref 4.0–10.5)

## 2017-04-28 LAB — VITAMIN B12

## 2017-04-28 LAB — IRON: Iron: 67 ug/dL (ref 42–145)

## 2017-04-28 LAB — IBC PANEL
Iron: 67 ug/dL (ref 42–145)
Saturation Ratios: 16.8 % — ABNORMAL LOW (ref 20.0–50.0)
TRANSFERRIN: 285 mg/dL (ref 212.0–360.0)

## 2017-04-28 NOTE — Patient Instructions (Addendum)
Tylenol  Is safer  Than ibuprofen .  Would   Welcome dr Tat  Input for the  Restless leg and sleep issues .  And the numbness of the  Right hand fingers . ? If  CTS or other that can be treated .    Will let you know lab results when back
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
## 2017-04-29 ENCOUNTER — Other Ambulatory Visit (INDEPENDENT_AMBULATORY_CARE_PROVIDER_SITE_OTHER): Payer: Medicare Other

## 2017-04-29 ENCOUNTER — Other Ambulatory Visit: Payer: Self-pay | Admitting: Internal Medicine

## 2017-04-29 DIAGNOSIS — N184 Chronic kidney disease, stage 4 (severe): Secondary | ICD-10-CM | POA: Diagnosis not present

## 2017-04-29 DIAGNOSIS — H26492 Other secondary cataract, left eye: Secondary | ICD-10-CM | POA: Diagnosis not present

## 2017-04-29 DIAGNOSIS — R739 Hyperglycemia, unspecified: Secondary | ICD-10-CM | POA: Diagnosis not present

## 2017-04-29 DIAGNOSIS — H04123 Dry eye syndrome of bilateral lacrimal glands: Secondary | ICD-10-CM | POA: Diagnosis not present

## 2017-04-29 DIAGNOSIS — H35033 Hypertensive retinopathy, bilateral: Secondary | ICD-10-CM | POA: Diagnosis not present

## 2017-04-29 DIAGNOSIS — H353131 Nonexudative age-related macular degeneration, bilateral, early dry stage: Secondary | ICD-10-CM | POA: Diagnosis not present

## 2017-04-29 LAB — HEMOGLOBIN A1C: Hgb A1c MFr Bld: 8.4 % — ABNORMAL HIGH (ref 4.6–6.5)

## 2017-04-29 LAB — FERRITIN: FERRITIN: 137.9 ng/mL (ref 10.0–291.0)

## 2017-04-29 NOTE — Progress Notes (Signed)
Hga1c

## 2017-04-29 NOTE — Progress Notes (Signed)
A1c was drawn at Neurology appt but Glucose was not tested. A1c levels elevated. Will send to Dr Regis Bill to advise next steps

## 2017-04-30 ENCOUNTER — Encounter: Payer: Self-pay | Admitting: Neurology

## 2017-04-30 ENCOUNTER — Ambulatory Visit (INDEPENDENT_AMBULATORY_CARE_PROVIDER_SITE_OTHER): Payer: Medicare Other | Admitting: Neurology

## 2017-04-30 VITALS — BP 110/60 | HR 64

## 2017-04-30 DIAGNOSIS — F0280 Dementia in other diseases classified elsewhere without behavioral disturbance: Secondary | ICD-10-CM | POA: Diagnosis not present

## 2017-04-30 DIAGNOSIS — G2581 Restless legs syndrome: Secondary | ICD-10-CM

## 2017-04-30 DIAGNOSIS — E1165 Type 2 diabetes mellitus with hyperglycemia: Secondary | ICD-10-CM | POA: Diagnosis not present

## 2017-04-30 DIAGNOSIS — G301 Alzheimer's disease with late onset: Secondary | ICD-10-CM | POA: Diagnosis not present

## 2017-04-30 MED ORDER — GABAPENTIN 100 MG PO CAPS: 100.0000 mg | ORAL_CAPSULE | Freq: Every day | ORAL | 1 refills | Status: DC

## 2017-04-30 MED ORDER — AMBULATORY NON FORMULARY MEDICATION: 0 refills | Status: DC

## 2017-04-30 MED ORDER — QUETIAPINE FUMARATE 25 MG PO TABS: ORAL_TABLET | ORAL | 1 refills | Status: DC

## 2017-04-30 NOTE — Patient Instructions (Signed)
1.  Take gabapentin 100 mg - one hour before bedtime 2.  In 1 week, you can take seroquel - 25 mg - 1/2 tablet in the AM and 1/2 tablet at bedtime 3.  Get tonic water and try to take that before bedtime for leg cramping

## 2017-05-06 ENCOUNTER — Ambulatory Visit (INDEPENDENT_AMBULATORY_CARE_PROVIDER_SITE_OTHER): Payer: Medicare Other | Admitting: Internal Medicine

## 2017-05-06 ENCOUNTER — Encounter: Payer: Self-pay | Admitting: Internal Medicine

## 2017-05-06 VITALS — BP 104/62 | HR 70 | Temp 97.7°F | Wt 137.0 lb

## 2017-05-06 DIAGNOSIS — R54 Age-related physical debility: Secondary | ICD-10-CM

## 2017-05-06 DIAGNOSIS — E1165 Type 2 diabetes mellitus with hyperglycemia: Secondary | ICD-10-CM | POA: Diagnosis not present

## 2017-05-06 DIAGNOSIS — N184 Chronic kidney disease, stage 4 (severe): Secondary | ICD-10-CM

## 2017-05-06 LAB — GLUCOSE, POCT (MANUAL RESULT ENTRY): POC GLUCOSE: 303 mg/dL — AB (ref 70–99)

## 2017-05-06 MED ORDER — SITAGLIPTIN PHOSPHATE 50 MG PO TABS: 50.0000 mg | ORAL_TABLET | Freq: Every day | ORAL | 3 refills | Status: DC

## 2017-05-06 NOTE — Patient Instructions (Addendum)
Audrey [name]  Once a day     continue   Attention to  Diet    Limit sugars and  Sweets  Breads.   Get a glucometer  Covered   For  Temporary  Use.  With strips   Try to check  bg  At least every other day   Plan ROV in 4-6 weeks.   Or as needed.    Preventing Type 2 Diabetes Mellitus Type 2 diabetes (type 2 diabetes mellitus) is a long-term (chronic) disease that affects blood sugar (glucose) levels. Normally, a hormone called insulin allows glucose to enter cells in the body. The cells use glucose for energy. In type 2 diabetes, one or both of these problems may be present:  The body does not make enough insulin.  The body does not respond properly to insulin that it makes (insulin resistance).  Insulin resistance or lack of insulin causes excess glucose to build up in the blood instead of going into cells. As a result, high blood glucose (hyperglycemia) develops, which can cause many complications. Being overweight or obese and having an inactive (sedentary) lifestyle can increase your risk for diabetes. Type 2 diabetes can be delayed or prevented by making certain nutrition and lifestyle changes. What nutrition changes can be made?  Eat healthy meals and snacks regularly. Keep a healthy snack with you for when you get hungry between meals, such as fruit or a handful of nuts.  Eat lean meats and proteins that are low in saturated fats, such as chicken, fish, egg whites, and beans. Avoid processed meats.  Eat plenty of fruits and vegetables and plenty of grains that have not been processed (whole grains). It is recommended that you eat: ? 1?2 cups of fruit every day. ? 2?3 cups of vegetables every day. ? 6?8 oz of whole grains every day, such as oats, whole wheat, bulgur, brown rice, quinoa, and millet.  Eat low-fat dairy products, such as milk, yogurt, and cheese.  Eat foods that contain healthy fats, such as nuts, avocado, olive oil, and canola oil.  Drink water throughout the day.  Avoid drinks that contain added sugar, such as soda or sweet tea.  Follow instructions from your health care provider about specific eating or drinking restrictions.  Control how much food you eat at a time (portion size). ? Check food labels to find out the serving sizes of foods. ? Use a kitchen scale to weigh amounts of foods.  Saute or steam food instead of frying it. Cook with water or broth instead of oils or butter.  Limit your intake of: ? Salt (sodium). Have no more than 1 tsp (2,400 mg) of sodium a day. If you have heart disease or high blood pressure, have less than ? tsp (1,500 mg) of sodium a day. ? Saturated fat. This is fat that is solid at room temperature, such as butter or fat on meat. What lifestyle changes can be made?  Activity  Do moderate-intensity physical activity for at least 30 minutes on at least 5 days of the week, or as much as told by your health care provider.  Ask your health care provider what activities are safe for you. A mix of physical activities may be best, such as walking, swimming, cycling, and strength training.  Try to add physical activity into your day. For example: ? Park in spots that are farther away than usual, so that you walk more. For example, park in a far corner of the parking lot  when you go to the office or the grocery store. ? Take a walk during your lunch break. ? Use stairs instead of elevators or escalators. Weight Loss  Lose weight as directed. Your health care provider can determine how much weight loss is best for you and can help you lose weight safely.  If you are overweight or obese, you may be instructed to lose at least 5?7 % of your body weight. Alcohol and Tobacco   Limit alcohol intake to no more than 1 drink a day for nonpregnant women and 2 drinks a day for men. One drink equals 12 oz of beer, 5 oz of wine, or 1 oz of hard liquor.  Do not use any tobacco products, such as cigarettes, chewing tobacco, and  e-cigarettes. If you need help quitting, ask your health care provider. Work With [place] Provider  Have your blood glucose tested regularly, as told by your health care provider.  Discuss your risk factors and how you can reduce your risk for diabetes.  Get screening tests as told by your health care provider. You may have screening tests regularly, especially if you have certain risk factors for type 2 diabetes.  Make an appointment with a diet and nutrition specialist (registered dietitian). A registered dietitian can help you make a healthy eating plan and can help you understand portion sizes and food labels. Why are these changes important?  It is possible to prevent or delay type 2 diabetes and related health problems by making lifestyle and nutrition changes.  It can be difficult to recognize signs of type 2 diabetes. The best way to avoid possible damage to your body is to take actions to prevent the disease before you develop symptoms. What can happen if changes are not made?  Your blood glucose levels may keep increasing. Having high blood glucose for a long time is dangerous. Too much glucose in your blood can damage your blood vessels, heart, kidneys, nerves, and eyes.  You may develop prediabetes or type 2 diabetes. Type 2 diabetes can lead to many chronic health problems and complications, such as: ? Heart disease. ? Stroke. ? Blindness. ? Kidney disease. ? Depression. ? Poor circulation in the feet and legs, which could lead to surgical removal (amputation) in severe cases. Where to find support:  Ask your health care provider to recommend a registered dietitian, diabetes educator, or weight loss program.  Look for local or online weight loss groups.  Join a gym, fitness club, or outdoor activity group, such as a walking club. Where to find more information: To learn more about diabetes and diabetes prevention, visit:  American Diabetes Association (ADA):  www.diabetes.[employer] of Diabetes and Digestive and Kidney Diseases: FindSpin.nl  To learn more about healthy eating, visit:  The U.S. Department of Agriculture Scientist, research (physical sciences)), Choose My Plate: http://wiley-williams.com/  Office of Disease Prevention and Health Promotion (ODPHP), Dietary Guidelines: SurferLive.at  Summary  You can reduce your risk for type 2 diabetes by increasing your physical activity, eating healthy foods, and losing weight as directed.  Talk with your health care provider about your risk for type 2 diabetes. Ask about any blood tests or screening tests that you need to have. This information is not intended to replace advice given to you by your health care provider. Make sure you discuss any questions you have with your health care provider. Document Released: 07/02/2015 Document Revised: 08/16/2015 Document Reviewed: 05/01/2015 Elsevier Interactive Patient Education  [employer].

## 2017-05-06 NOTE — Progress Notes (Signed)
Chief Complaint  Patient presents with  . Blood Sugar Problem    Discuss BS and treatment options.     HPI: Audrey [name] 84 y.o. come in for elevated BG  See past  Notes  here ith family  Unexpectedly elevated blood sugar end of last week at her last routine visit.  Since then she is cut out sweets breads pies other sugars.  This morning had  Eggs sausage and toast      Water   No sugar drink eggs toast and  Sausage.  When she saw Dr. Carles Collet she was placed on low-dose serequel  1/ 2dose and to go on gabapentin  For rls.     Feels fine otherwise no unusual polyuria polydipsia.  ROS: See pertinent positives and negatives per HPI.  Past Medical History:  Diagnosis Date  . AAA (abdominal aortic aneurysm) (HCC)    small  . Bilateral carotid artery stenosis   . CAD (coronary artery disease)    including LM disease; s/p CABG  . Colon cancer ([place])   . History of chickenpox   . History of colon cancer    s/p resection in 2004  . History of colonoscopy   . HTN (hypertension)   . Hyperlipidemia   . OA (osteoarthritis)    severe; s/p bilateral hip replacement    Family History  Problem Relation Age of Onset  . Cancer Mother        intra-abdominal/gallbladder  . Cholelithiasis Mother   . Heart attack Father        80  . Heart disease Father   . Diabetes Sister        24de  . Other Brother        GSW 43   . Heart attack Sister   . Colon cancer Neg Hx     Social History   Socioeconomic History  . Marital status: Widowed    Spouse name: None  . Number of children: 3  . Years of education: None  . Highest education level: None  Social Needs  . Financial resource strain: None  . Food insecurity - worry: None  . Food insecurity - inability: None  . Transportation needs - medical: None  . Transportation needs - non-medical: None  Occupational History  . 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  Smoking status: Never Smoker  . Smokeless tobacco: Never Used  Substance and Sexual Activity  . Alcohol use: No  . Drug use: No  . Sexual activity: None  Other Topics Concern  . 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      g3p3  Children are well   Lives independently by herself no pets family daughter  lives close by 8 hours of sleep at least    Negative TAD   14 years of education      Hearing aid and dentures   No falls .  Has smoke detector and wears seat belts. . No excess sun exposure. . No depression          Outpatient Medications Prior to Visit  Medication Sig Dispense Refill  . AMBULATORY NON FORMULARY MEDICATION Lift chair 1 Device 0  . aspirin (ADULT ASPIRIN EC LOW STRENGTH) 81 MG EC tablet Take 81 mg by mouth  daily.      . Cholecalciferol (VITAMIN D3) 2000 units TABS Take 2,000 Units by mouth 2 (two) times daily.    . clopidogrel (PLAVIX) 75 MG tablet Take 1 tablet (75 mg total) by mouth daily. 90 tablet 3  . ezetimibe (ZETIA) 10 MG tablet Take 1 tablet (10 mg total) by mouth daily. 90 tablet 3  . gabapentin (NEURONTIN) 100 MG capsule Take 1 capsule (100 mg total) by mouth at bedtime. 90 capsule 1  . ibuprofen (ADVIL,MOTRIN) 200 MG tablet Take 400 mg by mouth every 6 (six) hours as needed for mild pain.    . magnesium 30 MG tablet Take 30 mg by mouth at bedtime.    . metoprolol succinate (TOPROL-XL) 50 MG 24 hr tablet Take 1 tablet (50 mg total) by mouth daily. 90 tablet 3  . Multiple Vitamins-Minerals (ALIVE WOMENS 50+ PO) Take 1 tablet by mouth daily.    . QUEtiapine (SEROQUEL) 25 MG tablet 1/2 tablet twice per day 90 tablet 1  . rosuvastatin (CRESTOR) 40 MG tablet Take 1 tablet (40 mg total) every evening by mouth. 30 tablet 1  . telmisartan (MICARDIS) 20 MG tablet Take 1 tablet (20 mg total) by mouth daily. 90 tablet 3  . vitamin C (ASCORBIC  ACID) 500 MG tablet Take 500 mg by mouth daily.     No facility-administered medications prior to visit.      EXAM:  BP 104/62 (BP Location: Right Arm, Patient Position: Sitting, Cuff Size: Normal)   Pulse 70   Temp 97.7 F (36.5 C) (Oral)   Wt 137 lb (62.1 kg)   BMI 26.76 kg/m   Body mass index is 26.76 kg/m.  GENERAL: vitals reviewed and listed above, alert, oriented, appears well hydrated and in no acute distress HEENT: atraumatic, conjunctiva  clear, no obvious abnormalities on inspection of external nose and ears OP : no lesion edema or exudate  NECK: no obvious masses on inspection palpation  LUNGS: clear to auscultation bilaterally, no wheezes, rales or rhonchi, good air movement CV: HRRR, no clubbing cyanosis or  peripheral edema nl cap refill  MS: moves all extremities without noticeable focal  abnormality PSYCH: pleasant and cooperative, no obvious depression or anxiety Lab Results  Component Value Date   WBC 5.4 04/28/2017   HGB 12.9 04/28/2017   HCT 37.5 04/28/2017   PLT 190.0 04/28/2017   GLUCOSE 455 (H) 04/28/2017   CHOL 114 10/03/2015   TRIG 142.0 10/03/2015   HDL 44.80 10/03/2015   LDLDIRECT 89.5 08/06/2007   LDLCALC 41 10/03/2015   ALT 25 10/07/2016   AST 25 10/07/2016   NA 133 (L) 04/28/2017   K 5.0 04/28/2017   CL 98 04/28/2017   CREATININE 1.45 (H) 04/28/2017   BUN 30 (H) 04/28/2017   CO2 27 04/28/2017   TSH 4.21 10/07/2016   HGBA1C 8.4 (H) 04/29/2017   BP Readings from Last 3 Encounters:  05/06/17 104/62  04/30/17 110/60  04/28/17 100/60    ASSESSMENT AND PLAN:  Discussed the following assessment and plan:  Uncontrolled type 2 diabetes mellitus with hyperglycemia (HCC) - Plan: POC Glucose (CBG)  CKD (chronic kidney disease), stage IV ([place])  Advanced age gfr  Est 103   So   Metformin   Currently not  Advised  .  Plan intensive  Diet changes and  Poss januvia  Med  Others possilbe if use su nthen would need  Cautious  bg  Monitoring  goal would be below 200 .  PP   Sugar is 303 today  Other med options may be not as favorable because family would have to monitor her blood sugar.  Want to avoid hypoglycemia. Family for now wants to avoid insulin  If possible Patient is very cooperative and eating correctly.  Hemoglobin A1c of 8.4 shows that the elevation in blood sugar is severely is more recent. Declined diabetes referral at this time.  Family is supportive and knowledgeable. -Patient advised to return or notify health care team  if  new concerns arise. Uptodate.com for patients and links Total visit 77mins > 50% spent counseling and coordinating care as indicated in above note and in instructions to patient .   Patient Instructions  Celesta Gentile  Once a day     continue   Attention to  Diet    Limit sugars and  Sweets  Breads.   Get a glucometer  Covered   For  Temporary  Use.  With strips   Try to check  bg  At least every other day   Plan ROV in 4-6 weeks.   Or as needed.    Preventing Type 2 Diabetes Mellitus Type 2 diabetes (type 2 diabetes mellitus) is a long-term (chronic) disease that affects blood sugar (glucose) levels. Normally, a hormone called insulin allows glucose to enter cells in the body. The cells use glucose for energy. In type 2 diabetes, one or both of these problems may be present:  The body does not make enough insulin.  The body does not respond properly to insulin that it makes (insulin resistance).  Insulin resistance or lack of insulin causes excess glucose to build up in the blood instead of going into cells. As a result, high blood glucose (hyperglycemia) develops, which can cause many complications. Being overweight or obese and having an inactive (sedentary) lifestyle can increase your risk for diabetes. Type 2 diabetes can be delayed or prevented by making certain nutrition and lifestyle changes. What nutrition changes can be made?  Eat healthy meals and snacks regularly. Keep a healthy  snack with you for when you get hungry between meals, such as fruit or a handful of nuts.  Eat lean meats and proteins that are low in saturated fats, such as chicken, fish, egg whites, and beans. Avoid processed meats.  Eat plenty of fruits and vegetables and plenty of grains that have not been processed (whole grains). It is recommended that you eat: ? 1?2 cups of fruit every day. ? 2?3 cups of vegetables every day. ? 6?8 oz of whole grains every day, such as oats, whole wheat, bulgur, brown rice, quinoa, and millet.  Eat low-fat dairy products, such as milk, yogurt, and cheese.  Eat foods that contain healthy fats, such as nuts, avocado, olive oil, and canola oil.  Drink water throughout the day. Avoid drinks that contain added sugar, such as soda or sweet tea.  Follow instructions from your health care provider about specific eating or drinking restrictions.  Control how much food you eat at a time (portion size). ? Check food labels to find out the serving sizes of foods. ? Use a kitchen scale to weigh amounts of foods.  Saute or steam food instead of frying it. Cook with water or broth instead of oils or butter.  Limit your intake of: ? Salt (sodium). Have no more than 1 tsp (2,400 mg) of sodium a day. If you have heart disease or high blood pressure, have less than ? tsp (1,500 mg) of sodium  a day. ? Saturated fat. This is fat that is solid at room temperature, such as butter or fat on meat. What lifestyle changes can be made?  Activity  Do moderate-intensity physical activity for at least 30 minutes on at least 5 days of the week, or as much as told by your health care provider.  Ask your health care provider what activities are safe for you. A mix of physical activities may be best, such as walking, swimming, cycling, and strength training.  Try to add physical activity into your day. For example: ? Park in spots that are farther away than usual, so that you walk more.  For example, park in a far corner of the parking lot when you go to the office or the grocery store. ? Take a walk during your lunch break. ? Use stairs instead of elevators or escalators. Weight Loss  Lose weight as directed. Your health care provider can determine how much weight loss is best for you and can help you lose weight safely.  If you are overweight or obese, you may be instructed to lose at least 5?7 % of your body weight. Alcohol and Tobacco   Limit alcohol intake to no more than 1 drink a day for nonpregnant women and 2 drinks a day for men. One drink equals 12 oz of beer, 5 oz of wine, or 1 oz of hard liquor.  Do not use any tobacco products, such as cigarettes, chewing tobacco, and e-cigarettes. If you need help quitting, ask your health care provider. Work With [place] [facility] Provider  Have your blood glucose tested regularly, as told by your health care provider.  Discuss your risk factors and how you can reduce your risk for diabetes.  Get screening tests as told by your health care provider. You may have screening tests regularly, especially if you have certain risk factors for type 2 diabetes.  Make an appointment with a diet and nutrition specialist (registered dietitian). A registered dietitian can help you make a healthy eating plan and can help you understand portion sizes and food labels. Why are these changes important?  It is possible to prevent or delay type 2 diabetes and related health problems by making lifestyle and nutrition changes.  It can be difficult to recognize signs of type 2 diabetes. The best way to avoid possible damage to your body is to take actions to prevent the disease before you develop symptoms. What can happen if changes are not made?  Your blood glucose levels may keep increasing. Having high blood glucose for a long time is dangerous. Too much glucose in your blood can damage your blood vessels, heart, kidneys, nerves, and  eyes.  You may develop prediabetes or type 2 diabetes. Type 2 diabetes can lead to many chronic health problems and complications, such as: ? Heart disease. ? Stroke. ? Blindness. ? Kidney disease. ? Depression. ? Poor circulation in the feet and legs, which could lead to surgical removal (amputation) in severe cases. Where to find support:  Ask your health care provider to recommend a registered dietitian, diabetes educator, or weight loss program.  Look for local or online weight loss groups.  Join a gym, fitness club, or outdoor activity group, such as a walking club. Where to find more information: To learn more about diabetes and diabetes prevention, visit:  American Diabetes Association (ADA): www.diabetes.[employer] of Diabetes and Digestive and Kidney Diseases: FindSpin.nl  To learn more about healthy eating, visit:  The U.S. Department of Agriculture Scientist, research (physical sciences)), Choose My Plate: http://wiley-williams.com/  Office of Disease Prevention and Health Promotion (ODPHP), Dietary Guidelines: SurferLive.at  Summary  You can reduce your risk for type 2 diabetes by increasing your physical activity, eating healthy foods, and losing weight as directed.  Talk with your health care provider about your risk for type 2 diabetes. Ask about any blood tests or screening tests that you need to have. This information is not intended to replace advice given to you by your health care provider. Make sure you discuss any questions you have with your health care provider. Document Released: 07/02/2015 Document Revised: 08/16/2015 Document Reviewed: 05/01/2015 Elsevier Interactive Patient Education  2018 [place]. Tresha Muzio M.D.

## 2017-05-07 ENCOUNTER — Telehealth: Payer: Self-pay | Admitting: Family Medicine

## 2017-05-07 NOTE — Telephone Encounter (Signed)
Please advise Dr Panosh, thanks.   

## 2017-05-07 NOTE — Telephone Encounter (Signed)
Copied from [place]. Topic: General - Other >> May 07, 2017  9:10 AM Valla Leaver wrote: Reason for CRM: Daughter, [name], calling b/c the patient would like another A1C and glucose check done before taking Januvia. It costs $500 and she wants to do these labs fasting to get accurate results for blood sugar. (advised that thse labs aren't typically done fasting but I'm unsure) Neoma Laming would like a call back from Panosh cma to discuss.  Pt called [facility] this morning and they advised pt to check with pharmacy or insurance company about coverage for medication.

## 2017-05-07 NOTE — Telephone Encounter (Signed)
I agree too expensive    See what it cost  For ONGLYZA   A similar medication in the class  Hg a1c is usually repeated in 3 months  And may not be  Paid for if repeated too soon but we certainly can do a fasting  Blood sugar  And  Repeat  Hg a1c if you realize may not be covered .

## 2017-05-08 MED ORDER — SITAGLIPTIN PHOSPHATE 50 MG PO TABS: 50.0000 mg | ORAL_TABLET | Freq: Every day | ORAL | 3 refills | Status: DC

## 2017-05-08 NOTE — Telephone Encounter (Signed)
Pt's daughter called back and is going to check on the price of the medication that has been recommended.

## 2017-05-08 NOTE — Telephone Encounter (Signed)
Please send in medication as requested   90 days ok if   30 days not  Covered  And that is better for her

## 2017-05-08 NOTE — Telephone Encounter (Signed)
Please advise on Januvia Rx. Pt daughter states that they have decided to do this medication.Thanks.

## 2017-05-08 NOTE — Telephone Encounter (Signed)
Januvia sent to [facility].  Pt daughter aware. Nothing further needed.

## 2017-05-08 NOTE — Telephone Encounter (Signed)
Audrey [name] the patient daughter, is calling and states that she would like Januvia to be electronically called into [facility], [address] as they will pay for the medication instead of having to spend $500. Please advise.   [name] [phone]

## 2017-05-15 ENCOUNTER — Telehealth: Payer: Self-pay | Admitting: *Deleted

## 2017-05-15 NOTE — Telephone Encounter (Signed)
form received today and placed in red folder to be signed.  Will send to Dr Regis Bill as Juluis Rainier

## 2017-05-15 NOTE — Telephone Encounter (Signed)
Signed.

## 2017-05-15 NOTE — Telephone Encounter (Signed)
Caller name: [facility] [place]  Last visit: 05/06/17   Reason for call: Pharmacy needs Physician Order Form for home glucose testing supplies needs to be filled out and faxed back so patient can get blood glucose test supplies. This cannot be completed electronically. They faxed the form 2/13, 2/16, and 2/18 but have not gotten a response.   Please check to see if you have received form. If not received, call [facility] and she will resend.

## 2017-05-20 NOTE — Telephone Encounter (Signed)
This was faxed back on 05/18/17 to [facility] Nothing further needed.

## 2017-06-07 NOTE — Progress Notes (Signed)
Chief Complaint  Patient presents with  . Follow-up    Blood glucose average about 230,, increased hallucinations on seriquil    HPI: Audrey [name] 84 y.o. come in for  [place] Implemented  Dietary changes   hasnt started the [place] yet    Should she? Log is moslty 200 230  Range fasting and recnet 190 range .  Hallucinations and  Sleep still and issues  Gabapentin helps her RLS some swelling right leg   No new sx  ROS: See pertinent positives and negatives per HPI.  Past Medical History:  Diagnosis Date  . AAA (abdominal aortic aneurysm) (HCC)    small  . Bilateral carotid artery stenosis   . CAD (coronary artery disease)    including LM disease; s/p CABG  . Colon cancer ([place])   . History of chickenpox   . History of colon cancer    s/p resection in 2004  . History of colonoscopy   . HTN (hypertension)   . Hyperlipidemia   . OA (osteoarthritis)    severe; s/p bilateral hip replacement    Family History  Problem Relation Age of Onset  . Cancer Mother        intra-abdominal/gallbladder  . Cholelithiasis Mother   . Heart attack Father        22  . Heart disease Father   . Diabetes Sister        70de  . Other Brother        GSW 77   . Heart attack Sister   . Colon cancer Neg Hx     Social History   Socioeconomic History  . Marital status: Widowed    Spouse name: None  . Number of children: 3  . Years of education: None  . Highest education level: None  Social Needs  . Financial resource strain: None  . Food insecurity - worry: None  . Food insecurity - inability: None  . Transportation needs - medical: None  . Transportation needs - non-medical: None  Occupational History  . 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 Smoking status: Never Smoker  . Smokeless tobacco: Never Used  Substance and Sexual Activity  . Alcohol use: No  . Drug use: No  .  Sexual activity: None  Other Topics Concern  . 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      [id number]  Children are well   Lives independently by herself no pets family daughter  lives close by 8 hours of sleep at least    Negative TAD   14 years of education      Hearing aid and dentures   No falls .  Has smoke detector and wears seat belts. . No excess sun exposure. . No depression          Outpatient Medications Prior to Visit  Medication Sig Dispense Refill  . AMBULATORY NON FORMULARY MEDICATION Lift chair 1 Device 0  . aspirin (ADULT ASPIRIN EC LOW STRENGTH) 81 MG EC tablet Take 81 mg by mouth daily.      . Cholecalciferol (VITAMIN D3) 2000 units TABS Take 2,000 Units by mouth 2 (two) times daily.    . clopidogrel (PLAVIX) 75 MG tablet Take 1 tablet (  75 mg total) by mouth daily. 90 tablet 3  . ezetimibe (ZETIA) 10 MG tablet Take 1 tablet (10 mg total) by mouth daily. 90 tablet 3  . gabapentin (NEURONTIN) 100 MG capsule Take 1 capsule (100 mg total) by mouth at bedtime. 90 capsule 1  . ibuprofen (ADVIL,MOTRIN) 200 MG tablet Take 400 mg by mouth every 6 (six) hours as needed for mild pain.    . magnesium 30 MG tablet Take 30 mg by mouth at bedtime.    . metoprolol succinate (TOPROL-XL) 50 MG 24 hr tablet Take 1 tablet (50 mg total) by mouth daily. 90 tablet 3  . Multiple Vitamins-Minerals (ALIVE WOMENS 50+ PO) Take 1 tablet by mouth daily.    . QUEtiapine (SEROQUEL) 25 MG tablet 1/2 tablet twice per day 90 tablet 1  . rosuvastatin (CRESTOR) 40 MG tablet Take 1 tablet (40 mg total) every evening by mouth. 30 tablet 1  . telmisartan (MICARDIS) 20 MG tablet Take 1 tablet (20 mg total) by mouth daily. 90 tablet 3  . vitamin C (ASCORBIC ACID) 500 MG tablet Take 500 mg by mouth daily.    . sitaGLIPtin (JANUVIA) 50 MG tablet Take 1 tablet (50 mg total) by mouth daily. 30  tablet 3   No facility-administered medications prior to visit.      EXAM:  BP 128/78 (BP Location: Left Arm, Patient Position: Sitting, Cuff Size: Normal)   Pulse 60   Temp (!) 96.5 F (35.8 C) (Temporal)   Wt 134 lb 3.2 oz (60.9 kg)   BMI 26.21 kg/m   Body mass index is 26.21 kg/m.  GENERAL: vitals reviewed and listed above, alert, oriented, appears well hydrated and in no acute distress PSYCH: pleasant and cooperative,  In [facility] but alert and interactive  Legs no to trac edema  Lab Results  Component Value Date   WBC 5.4 04/28/2017   HGB 12.9 04/28/2017   HCT 37.5 04/28/2017   PLT 190.0 04/28/2017   GLUCOSE 455 (H) 04/28/2017   CHOL 114 10/03/2015   TRIG 142.0 10/03/2015   HDL 44.80 10/03/2015   LDLDIRECT 89.5 08/06/2007   LDLCALC 41 10/03/2015   ALT 25 10/07/2016   AST 25 10/07/2016   NA 133 (L) 04/28/2017   K 5.0 04/28/2017   CL 98 04/28/2017   CREATININE 1.45 (H) 04/28/2017   BUN 30 (H) 04/28/2017   CO2 27 04/28/2017   TSH 4.21 10/07/2016   HGBA1C 8.4 (H) 04/29/2017   BP Readings from Last 3 Encounters:  06/09/17 128/78  05/06/17 104/62  04/30/17 110/60   Wt Readings from Last 3 Encounters:  06/09/17 134 lb 3.2 oz (60.9 kg)  05/06/17 137 lb (62.1 kg)  04/28/17 137 lb 9.6 oz (62.4 kg)     ASSESSMENT AND PLAN:  Discussed the following assessment and plan:  Uncontrolled type 2 diabetes mellitus with hyperglycemia (HCC)  Hyperglycemia - Plan: POC Glucose (CBG)  Medication management  Advanced age Has implemented  Dietary changes   And not started med yet . reviewed risk  . dont think low bg a problem  And  Doubt sig risk of pancreatitis or chf etc.  Take 25 mg per day trial of januvia   Close monitoring and ROV in 2 months  Or as needed .  Sleep and hallucination problem more problematic  At this time  Total visit 98mins > 50% spent counseling and coordinating care as indicated in above note and in instructions to patient .     -  Patient  advised to return or notify health care team  if  new concerns arise.  Patient Instructions  Can try  Adding  25 mg of the sitagliptiin ( 1/2 of the 50 mg)  Every day  And see   How working over the next month . Continue lifestyle intervention healthy eating.  Check ocass  BG 2 hours after eating.     ROV in 2 months  But can check in  With readings in a month if not coming down or concerns   dont think its going to go low.          Standley Brooking. Andrzej Scully M.D.

## 2017-06-09 ENCOUNTER — Encounter: Payer: Self-pay | Admitting: Internal Medicine

## 2017-06-09 ENCOUNTER — Ambulatory Visit (INDEPENDENT_AMBULATORY_CARE_PROVIDER_SITE_OTHER): Payer: Medicare Other | Admitting: Internal Medicine

## 2017-06-09 ENCOUNTER — Telehealth: Payer: Self-pay | Admitting: Neurology

## 2017-06-09 VITALS — BP 128/78 | HR 60 | Temp 96.5°F | Wt 134.2 lb

## 2017-06-09 DIAGNOSIS — E1165 Type 2 diabetes mellitus with hyperglycemia: Secondary | ICD-10-CM

## 2017-06-09 DIAGNOSIS — R739 Hyperglycemia, unspecified: Secondary | ICD-10-CM | POA: Diagnosis not present

## 2017-06-09 DIAGNOSIS — Z79899 Other long term (current) drug therapy: Secondary | ICD-10-CM | POA: Diagnosis not present

## 2017-06-09 DIAGNOSIS — R54 Age-related physical debility: Secondary | ICD-10-CM

## 2017-06-09 LAB — GLUCOSE, POCT (MANUAL RESULT ENTRY): POC Glucose: 233 mg/dl — AB (ref 70–99)

## 2017-06-09 MED ORDER — SITAGLIPTIN PHOSPHATE 50 MG PO TABS: 25.0000 mg | ORAL_TABLET | Freq: Every day | ORAL | 3 refills | Status: DC

## 2017-06-09 NOTE — Patient Instructions (Addendum)
Can try  Adding  25 mg of the sitagliptiin ( 1/2 of the 50 mg)  Every day  And see   How working over the next month . Continue lifestyle intervention healthy eating.  Check ocass  BG 2 hours after eating.     ROV in 2 months  But can check in  With readings in a month if not coming down or concerns   dont think its going to go low.

## 2017-06-09 NOTE — Telephone Encounter (Signed)
From last office note:   "Hallucinations are becoming more prominent again.  Talked to her about Seroquel.  She tried in the past and thought may be it made her worse, but she was not on it for very long and I am not sure we had a high enough dose.  Ultimately, she was willing to retry.  She will take 12.5 mg in the morning and 12.5 mg at bedtime.  We did talk about the fact that the atypical antipsychotic medications are not indicated for dementia related psychosis and increased risk of mortality in the elderly, usually because of infectious or  cardiac related. Understanding is expressed and they were agreeable that the benefits outweigh the risks in this case."  Please advise on Seroquel.

## 2017-06-09 NOTE — Telephone Encounter (Signed)
1.     Name of medication: Quetiapine  2.     How are you currently taking this medication (dosage and times per day) 1/2 pill  3.     Are you having a reaction (difficulty breathing--STAT)No  4.     What is your medication issue? Pt's son Liliane Channel called and wanted to let Dr Tat know the medication Quetiapine  is not working, her hallucinations are getting worse and more she is more stumbly, wants to know if she can be taken off of it

## 2017-06-10 NOTE — Telephone Encounter (Signed)
If it is just not working, it needs to be increased as on small dosage.  Otherwise, if SE, can try low dose zyprexa, 2.5 mg q hs.  May need increased.  If no help or SE, will need to refer to psychiatry for assist

## 2017-06-10 NOTE — Telephone Encounter (Signed)
Tried to call patient's son to discuss. No answer. VM full.

## 2017-06-11 NOTE — Telephone Encounter (Signed)
Spoke with patient's son. They would like to have patient stop medication for a couple weeks before trying something else. She is only taking Seroquel 12.5 mg right now and aware she did not need to wean to stop the medication.   Aware she is probably still having hallucinations because that is a very low dose but were not interested in increasing or adding new medication right now.   They will call back.

## 2017-06-25 ENCOUNTER — Inpatient Hospital Stay: Payer: Medicare Other | Attending: Hematology and Oncology

## 2017-06-25 DIAGNOSIS — R531 Weakness: Secondary | ICD-10-CM | POA: Diagnosis not present

## 2017-06-25 DIAGNOSIS — D472 Monoclonal gammopathy: Secondary | ICD-10-CM | POA: Diagnosis not present

## 2017-06-25 DIAGNOSIS — Z85038 Personal history of other malignant neoplasm of large intestine: Secondary | ICD-10-CM | POA: Insufficient documentation

## 2017-06-25 DIAGNOSIS — N184 Chronic kidney disease, stage 4 (severe): Secondary | ICD-10-CM | POA: Diagnosis not present

## 2017-06-25 DIAGNOSIS — Z7982 Long term (current) use of aspirin: Secondary | ICD-10-CM | POA: Insufficient documentation

## 2017-06-25 DIAGNOSIS — Z79899 Other long term (current) drug therapy: Secondary | ICD-10-CM | POA: Insufficient documentation

## 2017-06-25 DIAGNOSIS — E1122 Type 2 diabetes mellitus with diabetic chronic kidney disease: Secondary | ICD-10-CM | POA: Diagnosis not present

## 2017-06-25 DIAGNOSIS — E1165 Type 2 diabetes mellitus with hyperglycemia: Secondary | ICD-10-CM | POA: Diagnosis not present

## 2017-06-25 LAB — COMPREHENSIVE METABOLIC PANEL WITH GFR
ALT: 47 U/L (ref 0–55)
AST: 33 U/L (ref 5–34)
Albumin: 4.1 g/dL (ref 3.5–5.0)
Alkaline Phosphatase: 107 U/L (ref 40–150)
Anion gap: 8 (ref 3–11)
BUN: 33 mg/dL — ABNORMAL HIGH (ref 7–26)
CO2: 24 mmol/L (ref 22–29)
Calcium: 9.7 mg/dL (ref 8.4–10.4)
Chloride: 101 mmol/L (ref 98–109)
Creatinine, Ser: 1.57 mg/dL — ABNORMAL HIGH (ref 0.60–1.10)
GFR calc Af Amer: 33 mL/min — ABNORMAL LOW
GFR calc non Af Amer: 28 mL/min — ABNORMAL LOW
Glucose, Bld: 211 mg/dL — ABNORMAL HIGH (ref 70–140)
Potassium: 5 mmol/L (ref 3.5–5.1)
Sodium: 133 mmol/L — ABNORMAL LOW (ref 136–145)
Total Bilirubin: 1.3 mg/dL — ABNORMAL HIGH (ref 0.2–1.2)
Total Protein: 6.8 g/dL (ref 6.4–8.3)

## 2017-06-25 LAB — CBC WITH DIFFERENTIAL/PLATELET
Basophils Absolute: 0 K/uL (ref 0.0–0.1)
Basophils Relative: 0 %
Eosinophils Absolute: 0 K/uL (ref 0.0–0.5)
Eosinophils Relative: 1 %
HCT: 38.6 % (ref 34.8–46.6)
Hemoglobin: 12.9 g/dL (ref 11.6–15.9)
Lymphocytes Relative: 19 %
Lymphs Abs: 1.1 K/uL (ref 0.9–3.3)
MCH: 29.1 pg (ref 25.1–34.0)
MCHC: 33.4 g/dL (ref 31.5–36.0)
MCV: 87.1 fL (ref 79.5–101.0)
Monocytes Absolute: 0.6 K/uL (ref 0.1–0.9)
Monocytes Relative: 9 %
Neutro Abs: 4.3 K/uL (ref 1.5–6.5)
Neutrophils Relative %: 71 %
Platelets: 162 K/uL (ref 145–400)
RBC: 4.43 MIL/uL (ref 3.70–5.45)
RDW: 15.3 % — ABNORMAL HIGH (ref 11.2–14.5)
WBC: 6 K/uL (ref 3.9–10.3)

## 2017-06-26 LAB — KAPPA/LAMBDA LIGHT CHAINS
KAPPA FREE LGHT CHN: 26.1 mg/L — AB (ref 3.3–19.4)
Kappa, lambda light chain ratio: 1.21 (ref 0.26–1.65)
Lambda free light chains: 21.5 mg/L (ref 5.7–26.3)

## 2017-06-30 LAB — MULTIPLE MYELOMA PANEL, SERUM
ALBUMIN SERPL ELPH-MCNC: 3.7 g/dL (ref 2.9–4.4)
ALPHA 1: 0.3 g/dL (ref 0.0–0.4)
Albumin/Glob SerPl: 1.5 (ref 0.7–1.7)
Alpha2 Glob SerPl Elph-Mcnc: 0.7 g/dL (ref 0.4–1.0)
B-Globulin SerPl Elph-Mcnc: 1.1 g/dL (ref 0.7–1.3)
GLOBULIN, TOTAL: 2.6 g/dL (ref 2.2–3.9)
Gamma Glob SerPl Elph-Mcnc: 0.6 g/dL (ref 0.4–1.8)
IGA: 267 mg/dL (ref 64–422)
IGM (IMMUNOGLOBULIN M), SRM: 55 mg/dL (ref 26–217)
IgG (Immunoglobin G), Serum: 618 mg/dL — ABNORMAL LOW (ref 700–1600)
M Protein SerPl Elph-Mcnc: 0.6 g/dL — ABNORMAL HIGH
TOTAL PROTEIN ELP: 6.3 g/dL (ref 6.0–8.5)

## 2017-07-02 ENCOUNTER — Inpatient Hospital Stay (HOSPITAL_BASED_OUTPATIENT_CLINIC_OR_DEPARTMENT_OTHER): Payer: Medicare Other | Admitting: Hematology and Oncology

## 2017-07-02 ENCOUNTER — Telehealth: Payer: Self-pay | Admitting: Hematology and Oncology

## 2017-07-02 ENCOUNTER — Encounter: Payer: Self-pay | Admitting: Hematology and Oncology

## 2017-07-02 DIAGNOSIS — Z79899 Other long term (current) drug therapy: Secondary | ICD-10-CM

## 2017-07-02 DIAGNOSIS — Z7982 Long term (current) use of aspirin: Secondary | ICD-10-CM | POA: Diagnosis not present

## 2017-07-02 DIAGNOSIS — E1165 Type 2 diabetes mellitus with hyperglycemia: Secondary | ICD-10-CM | POA: Diagnosis not present

## 2017-07-02 DIAGNOSIS — Z85038 Personal history of other malignant neoplasm of large intestine: Secondary | ICD-10-CM

## 2017-07-02 DIAGNOSIS — E1122 Type 2 diabetes mellitus with diabetic chronic kidney disease: Secondary | ICD-10-CM | POA: Diagnosis not present

## 2017-07-02 DIAGNOSIS — N184 Chronic kidney disease, stage 4 (severe): Secondary | ICD-10-CM

## 2017-07-02 DIAGNOSIS — D472 Monoclonal gammopathy: Secondary | ICD-10-CM | POA: Diagnosis not present

## 2017-07-02 DIAGNOSIS — E1129 Type 2 diabetes mellitus with other diabetic kidney complication: Secondary | ICD-10-CM | POA: Insufficient documentation

## 2017-07-02 DIAGNOSIS — R531 Weakness: Secondary | ICD-10-CM | POA: Diagnosis not present

## 2017-07-02 NOTE — Telephone Encounter (Signed)
Gave patient AVs and calendar of upcoming April 2020 appointments.  °

## 2017-07-02 NOTE — Assessment & Plan Note (Addendum)
Serum immunofixation detected IgA kappa She has normal IgA level and with mildly elevated kappa light chain. Her chronic renal failure is unrelated. She has no signs of anemia, hypercalcemia or bone lesions. I recommend observation only with yearly visit The patient and family members were educated to watch out for signs and symptoms of disease progression

## 2017-07-02 NOTE — Assessment & Plan Note (Signed)
Her recent kidney function tests show chronic kidney disease stage IV. She is not symptomatic. This is likely related to old age, diabetes and dehydration. Recommend aggressive risk factor modification and increase oral fluid intake.

## 2017-07-02 NOTE — Progress Notes (Signed)
[facility] OFFICE PROGRESS NOTE  Patient Care Team: Panosh, Standley Brooking, MD as PCP - General (Internal Medicine) Paralee Cancel, MD (Orthopedic Surgery) Bensimhon, Shaune Pascal, MD (Cardiology) Burnell Blanks, MD as Attending Physician (Cardiology) Calvert Cantor, MD as Attending Physician (Ophthalmology) Tat, Eustace Quail, DO as Consulting Physician (Neurology)  ASSESSMENT & PLAN:  MGUS (monoclonal gammopathy of unknown significance) Serum immunofixation detected IgA kappa She has normal IgA level and with mildly elevated kappa light chain. Her chronic renal failure is unrelated. She has no signs of anemia, hypercalcemia or bone lesions. I recommend observation only with yearly visit The patient and family members were educated to watch out for signs and symptoms of disease progression  CKD (chronic kidney disease), stage IV ([place]) Her recent kidney function tests show chronic kidney disease stage IV. She is not symptomatic. This is likely related to old age, diabetes and dehydration. Recommend aggressive risk factor modification and increase oral fluid intake.  Poorly controlled type 2 diabetes mellitus with renal complication (HCC) She has poorly controlled diabetes that contributed to renal failure She is currently on medical management We discussed dietary modification   No orders of the defined types were placed in this encounter.   INTERVAL HISTORY: Please see below for problem oriented charting. She returns for further follow-up She denies recent infection No new bone pain Appetite is stable She is currently on aggressive medical management for diabetes  SUMMARY OF ONCOLOGIC HISTORY:  Audrey [name] returns for follow-up on MGUS She had recurrent falls and memory deficit. Last year, she had significant fall and fractured her clavicle, managed conservatively. She was evaluated by neurologist. As part of her workup, she was noted to have abnormal serum  protein electrophoresis. Blood work from March 25, 2016 showed abnormalitie. She also had mild renal failure. She was hence referred here for further evaluation. In 2018, she had workup for hemorrhage around the right eye. It does not affect her vision. She had easy bruising. She has weakness especially in her left leg and recurrent falls.  She denies history of abnormal bone pain now Patient denies history of recurrent infection or atypical infections such as shingles of meningitis. Denies chills, night sweats, anorexia or abnormal weight loss. She had remote history of colon cancer over 20 years ago, managed with surgery only without need for adjuvant treatment She has frequent surveillance colonoscopy without evidence of recurrence. Subsequent workup revealed IgA kappa MGUS, on observation  REVIEW OF SYSTEMS:   Constitutional: Denies fevers, chills or abnormal weight loss Eyes: Denies blurriness of vision Ears, nose, mouth, throat, and face: Denies mucositis or sore throat Respiratory: Denies cough, dyspnea or wheezes Cardiovascular: Denies palpitation, chest discomfort or lower extremity swelling Gastrointestinal:  Denies nausea, heartburn or change in bowel habits Skin: Denies abnormal skin rashes Lymphatics: Denies new lymphadenopathy or easy bruising Neurological:Denies numbness, tingling or new weaknesses Behavioral/Psych: Mood is stable, no new changes  All other systems were reviewed with the patient and are negative.  I have reviewed the past medical history, past surgical history, social history and family history with the patient and they are unchanged from previous note.  ALLERGIES:  is allergic to aricept [donepezil hcl].  MEDICATIONS:  Current Outpatient Medications  Medication Sig Dispense Refill  . AMBULATORY NON FORMULARY MEDICATION Lift chair 1 Device 0  . aspirin (ADULT ASPIRIN EC LOW STRENGTH) 81 MG EC tablet Take 81 mg by mouth daily.      . Cholecalciferol  (VITAMIN D3) 2000 units  TABS Take 2,000 Units by mouth 2 (two) times daily.    . clopidogrel (PLAVIX) 75 MG tablet Take 1 tablet (75 mg total) by mouth daily. 90 tablet 3  . ezetimibe (ZETIA) 10 MG tablet Take 1 tablet (10 mg total) by mouth daily. 90 tablet 3  . gabapentin (NEURONTIN) 100 MG capsule Take 1 capsule (100 mg total) by mouth at bedtime. 90 capsule 1  . ibuprofen (ADVIL,MOTRIN) 200 MG tablet Take 400 mg by mouth every 6 (six) hours as needed for mild pain.    . magnesium 30 MG tablet Take 30 mg by mouth at bedtime.    . metoprolol succinate (TOPROL-XL) 50 MG 24 hr tablet Take 1 tablet (50 mg total) by mouth daily. 90 tablet 3  . Multiple Vitamins-Minerals (ALIVE WOMENS 50+ PO) Take 1 tablet by mouth daily.    . QUEtiapine (SEROQUEL) 25 MG tablet 1/2 tablet twice per day 90 tablet 1  . rosuvastatin (CRESTOR) 40 MG tablet Take 1 tablet (40 mg total) every evening by mouth. 30 tablet 1  . sitaGLIPtin (JANUVIA) 50 MG tablet Take 0.5 tablets (25 mg total) by mouth daily. 30 tablet 3  . telmisartan (MICARDIS) 20 MG tablet Take 1 tablet (20 mg total) by mouth daily. 90 tablet 3  . vitamin C (ASCORBIC ACID) 500 MG tablet Take 500 mg by mouth daily.     No current facility-administered medications for this visit.     PHYSICAL EXAMINATION: ECOG PERFORMANCE STATUS: 2 - Symptomatic, <50% confined to bed  Vitals:   07/02/17 1434  BP: (!) 133/56  Pulse: 67  Resp: 17  Temp: (!) 97.5 F (36.4 C)  SpO2: 100%   Filed Weights   07/02/17 1434  Weight: 137 lb 12.8 oz (62.5 kg)    GENERAL:alert, no distress and comfortable NEURO: alert & oriented x 3 with fluent speech, no focal motor/sensory deficits  LABORATORY DATA:  I have reviewed the data as listed    Component Value Date/Time   NA 133 (L) 06/25/2017 1149   NA 141 04/24/2016 1535   K 5.0 06/25/2017 1149   K 4.7 04/24/2016 1535   CL 101 06/25/2017 1149   CO2 24 06/25/2017 1149   CO2 27 04/24/2016 1535   GLUCOSE 211 (H)  06/25/2017 1149   GLUCOSE 119 04/24/2016 1535   BUN 33 (H) 06/25/2017 1149   BUN 22.1 04/24/2016 1535   CREATININE 1.57 (H) 06/25/2017 1149   CREATININE 1.2 (H) 04/24/2016 1535   CALCIUM 9.7 06/25/2017 1149   CALCIUM 9.7 04/24/2016 1535   PROT 6.8 06/25/2017 1149   PROT 6.8 04/24/2016 1535   PROT 6.2 04/24/2016 1535   ALBUMIN 4.1 06/25/2017 1149   ALBUMIN 4.2 04/24/2016 1535   AST 33 06/25/2017 1149   AST 35 (H) 04/24/2016 1535   ALT 47 06/25/2017 1149   ALT 32 04/24/2016 1535   ALKPHOS 107 06/25/2017 1149   ALKPHOS 117 04/24/2016 1535   BILITOT 1.3 (H) 06/25/2017 1149   BILITOT 1.20 04/24/2016 1535   GFRNONAA 28 (L) 06/25/2017 1149   GFRAA 33 (L) 06/25/2017 1149    No results found for: SPEP, UPEP  Lab Results  Component Value Date   WBC 6.0 06/25/2017   NEUTROABS 4.3 06/25/2017   HGB 12.9 06/25/2017   HCT 38.6 06/25/2017   MCV 87.1 06/25/2017   PLT 162 06/25/2017      Chemistry      Component Value Date/Time   NA 133 (L) 06/25/2017 1149  NA 141 04/24/2016 1535   K 5.0 06/25/2017 1149   K 4.7 04/24/2016 1535   CL 101 06/25/2017 1149   CO2 24 06/25/2017 1149   CO2 27 04/24/2016 1535   BUN 33 (H) 06/25/2017 1149   BUN 22.1 04/24/2016 1535   CREATININE 1.57 (H) 06/25/2017 1149   CREATININE 1.2 (H) 04/24/2016 1535      Component Value Date/Time   CALCIUM 9.7 06/25/2017 1149   CALCIUM 9.7 04/24/2016 1535   ALKPHOS 107 06/25/2017 1149   ALKPHOS 117 04/24/2016 1535   AST 33 06/25/2017 1149   AST 35 (H) 04/24/2016 1535   ALT 47 06/25/2017 1149   ALT 32 04/24/2016 1535   BILITOT 1.3 (H) 06/25/2017 1149   BILITOT 1.20 04/24/2016 1535       All questions were answered. The patient knows to call the clinic with any problems, questions or concerns. No barriers to learning was detected.  I spent 15 minutes counseling the patient face to face. The total time spent in the appointment was 20 minutes and more than 50% was on counseling and review of test  results  Heath Lark, MD 07/02/2017 3:07 PM

## 2017-07-02 NOTE — Assessment & Plan Note (Signed)
She has poorly controlled diabetes that contributed to renal failure She is currently on medical management We discussed dietary modification
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
## 2017-07-28 ENCOUNTER — Ambulatory Visit (INDEPENDENT_AMBULATORY_CARE_PROVIDER_SITE_OTHER): Payer: Medicare Other | Admitting: Neurology

## 2017-07-28 ENCOUNTER — Encounter: Payer: Self-pay | Admitting: Neurology

## 2017-07-28 VITALS — BP 90/58 | HR 66 | Ht 60.0 in | Wt 136.5 lb

## 2017-07-28 DIAGNOSIS — G2581 Restless legs syndrome: Secondary | ICD-10-CM | POA: Diagnosis not present

## 2017-07-28 DIAGNOSIS — G301 Alzheimer's disease with late onset: Secondary | ICD-10-CM

## 2017-07-28 DIAGNOSIS — R441 Visual hallucinations: Secondary | ICD-10-CM | POA: Diagnosis not present

## 2017-07-28 DIAGNOSIS — F0280 Dementia in other diseases classified elsewhere without behavioral disturbance: Secondary | ICD-10-CM | POA: Diagnosis not present

## 2017-07-28 MED ORDER — RIVASTIGMINE 4.6 MG/24HR TD PT24: 4.6000 mg | MEDICATED_PATCH | Freq: Every day | TRANSDERMAL | 1 refills | Status: DC

## 2017-08-11 ENCOUNTER — Ambulatory Visit: Payer: Medicare Other | Admitting: Internal Medicine

## 2017-08-19 ENCOUNTER — Encounter: Payer: Self-pay | Admitting: Cardiovascular Disease

## 2017-08-19 ENCOUNTER — Ambulatory Visit (INDEPENDENT_AMBULATORY_CARE_PROVIDER_SITE_OTHER): Payer: Medicare Other | Admitting: Cardiovascular Disease

## 2017-08-19 ENCOUNTER — Telehealth: Payer: Self-pay | Admitting: Neurology

## 2017-08-19 VITALS — BP 128/60 | HR 63 | Ht 60.0 in | Wt 135.0 lb

## 2017-08-19 DIAGNOSIS — E7800 Pure hypercholesterolemia, unspecified: Secondary | ICD-10-CM | POA: Diagnosis not present

## 2017-08-19 DIAGNOSIS — I10 Essential (primary) hypertension: Secondary | ICD-10-CM | POA: Diagnosis not present

## 2017-08-19 DIAGNOSIS — I6523 Occlusion and stenosis of bilateral carotid arteries: Secondary | ICD-10-CM

## 2017-08-19 DIAGNOSIS — I2510 Atherosclerotic heart disease of native coronary artery without angina pectoris: Secondary | ICD-10-CM

## 2017-08-19 MED ORDER — RIVASTIGMINE 4.6 MG/24HR TD PT24: 4.6000 mg | MEDICATED_PATCH | Freq: Every day | TRANSDERMAL | 1 refills | Status: DC

## 2017-08-19 NOTE — Patient Instructions (Signed)
Medication Instructions:  Your physician has recommended you make the following change in your medication:  Stop Plavix (clopidogrel)   Labwork: none  Testing/Procedures: none  Follow-Up: Your physician recommends that you schedule a follow-up appointment in: 12 months. Please call our office in about 8 months to schedule this appointment    Any Other Special Instructions Will Be Listed Below (If Applicable).     If you need a refill on your cardiac medications before your next appointment, please call your pharmacy.

## 2017-08-19 NOTE — Telephone Encounter (Signed)
Spoke with patient's son and let him know we could write an RX for him to take to the [place] to see if they will fill the medication. RX written and mailed per request.

## 2017-08-19 NOTE — Telephone Encounter (Signed)
Patient's son Liliane Channel came by the office to see if the New Patch that Eleesha was put on could be filed through the [facility]? He said it seems to be working well for her, but might be cheaper through the [facility] [place]. Please Call. Thanks

## 2017-08-19 NOTE — Progress Notes (Signed)
Chief Complaint  Patient presents with  . Follow-up    CAD   History of Present Illness: 84 yo female with history of coronary artery disease s/p 2V CABG in December 2004, HTN, HLD, bilateral carotid artery stenosis, AAA, as well as elevated liver enzymes on Lipitor who is here today for cardiac follow up. She has been followed in the past by Dr. Haroldine Laws. Carotid u/s June 2018 with stable moderate bilateral ICA stenosis. Abdominal u/s 11/18/10 showed very small AAA 2.1x 2.1 cm, stable. Stress test January 2016 with no ischemia.   She is here today for follow up. The patient denies any chest pain, dyspnea, palpitations, lower extremity edema, orthopnea, PND, dizziness, near syncope or syncope. Her dementia is worsening. Her son is here with her today.   Primary Care Physician: Burnis Medin, MD  Past Medical History:  Diagnosis Date  . AAA (abdominal aortic aneurysm) (HCC)    small  . Bilateral carotid artery stenosis   . CAD (coronary artery disease)    including LM disease; s/p CABG  . Colon cancer ([place])   . History of chickenpox   . History of colon cancer    s/p resection in 2004  . History of colonoscopy   . HTN (hypertension)   . Hyperlipidemia   . OA (osteoarthritis)    severe; s/p bilateral hip replacement    Past Surgical History:  Procedure Laterality Date  . CATARACT EXTRACTION, BILATERAL    . CORONARY ARTERY BYPASS GRAFT  2004  . left hip replacement  2007  . PARTIAL COLECTOMY  2004   sigmoid resection  . REFRACTIVE SURGERY  2013   had a film over eye removed.  . right hip replacement  2008    Current Outpatient Medications  Medication Sig Dispense Refill  . AMBULATORY NON FORMULARY MEDICATION Lift chair 1 Device 0  . aspirin (ADULT ASPIRIN EC LOW STRENGTH) 81 MG EC tablet Take 81 mg by mouth daily.      . Cholecalciferol (VITAMIN D3) 2000 units TABS Take 2,000 Units by mouth 2 (two) times daily.    Marland Kitchen ezetimibe (ZETIA) 10 MG tablet Take 1 tablet (10 mg  total) by mouth daily. 90 tablet 3  . gabapentin (NEURONTIN) 100 MG capsule Take 1 capsule (100 mg total) by mouth at bedtime. 90 capsule 1  . ibuprofen (ADVIL,MOTRIN) 200 MG tablet Take 400 mg by mouth every 6 (six) hours as needed for mild pain.    . magnesium 30 MG tablet Take 30 mg by mouth at bedtime.    . metoprolol succinate (TOPROL-XL) 50 MG 24 hr tablet Take 1 tablet (50 mg total) by mouth daily. 90 tablet 3  . Multiple Vitamins-Minerals (ALIVE WOMENS 50+ PO) Take 1 tablet by mouth daily.    . rivastigmine (EXELON) 4.6 mg/24hr Place 1 patch (4.6 mg total) onto the skin daily. 30 patch 1  . rosuvastatin (CRESTOR) 40 MG tablet Take 1 tablet (40 mg total) every evening by mouth. 30 tablet 1  . sitaGLIPtin (JANUVIA) 50 MG tablet Take 0.5 tablets (25 mg total) by mouth daily. 30 tablet 3  . telmisartan (MICARDIS) 20 MG tablet Take 1 tablet (20 mg total) by mouth daily. 90 tablet 3  . vitamin C (ASCORBIC ACID) 500 MG tablet Take 500 mg by mouth daily.     No current facility-administered medications for this visit.     Allergies  Allergen Reactions  . Aricept [Donepezil Hcl] Diarrhea    Social History  Socioeconomic History  . Marital status: Widowed    Spouse name: Not on file  . Number of children: 3  . Years of education: Not on file  . Highest education level: Not on file  Occupational History  . 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 Financial resource strain: Not on file  . Food insecurity:    Worry: Not on file    Inability: Not on file  . Transportation needs:    Medical: Not on file    Non-medical: Not on file  Tobacco Use  . Smoking status: Never Smoker  . Smokeless tobacco: Never Used  Substance and Sexual Activity  . Alcohol use: No  . Drug use: No  . Sexual activity: Not on file  Lifestyle  . Physical activity:    Days per week: Not on file    Minutes per session: Not on file  .  Stress: Not on file  Relationships  . Social connections:    Talks on phone: Not on file    Gets together: Not on file    Attends religious service: Not on file    Active member of club or organization: Not on file    Attends meetings of clubs or organizations: Not on file    Relationship status: Not on file  . Intimate partner violence:    Fear of current or ex partner: Not on file    Emotionally abused: Not on file    Physically abused: Not on file    Forced sexual activity: Not on file  Other Topics Concern  . 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      g3p3  Children are well   Lives independently by herself no pets family daughter  lives close by 8 hours of sleep at least    Negative TAD   14 years of education      Hearing aid and dentures   No falls .  Has smoke detector and wears seat belts. . No excess sun exposure. . No depression          Family History  Problem Relation Age of Onset  . Cancer Mother        intra-abdominal/gallbladder  . Cholelithiasis Mother   . Heart attack Father        14  . Heart disease Father   . Diabetes Sister        38de  . Other Brother        GSW 89   . Heart attack Sister   . Colon cancer Neg Hx     Review of Systems:  As stated in the HPI and otherwise negative.   BP 128/60   Pulse 63   Ht 5' (1.524 m)   Wt 135 lb (61.2 kg)   SpO2 99%   BMI 26.37 kg/m   Physical Examination:  General: Well developed, well nourished, NAD  HEENT: OP clear, mucus membranes moist  SKIN: warm, dry. No rashes. Neuro: No focal deficits  Musculoskeletal: Muscle strength 5/5 all ext  Psychiatric: Mood and affect normal  Neck: No JVD, no carotid bruits, no thyromegaly, no lymphadenopathy.  Lungs:Clear bilaterally, no wheezes, rhonci, crackles Cardiovascular: Regular rate and rhythm. No murmurs, gallops or  rubs. Abdomen:Soft. Bowel sounds present. Non-tender.  Extremities: No lower extremity edema. Pulses are 2 + in the bilateral DP/PT.  EKG:  EKG is ordered today. The ekg ordered today demonstrates NSR, rate 63 bpm. Incomplete RBBB. T wave flattening diffusely.   Recent Labs: 10/07/2016: TSH 4.21 06/25/2017: ALT 47; BUN 33; Creatinine, Ser 1.57; Hemoglobin 12.9; Platelets 162; Potassium 5.0; Sodium 133   Lipid Panel    Component Value Date/Time   CHOL 114 10/03/2015 1104   TRIG 142.0 10/03/2015 1104   HDL 44.80 10/03/2015 1104   CHOLHDL 3 10/03/2015 1104   VLDL 28.4 10/03/2015 1104   LDLCALC 41 10/03/2015 1104   LDLDIRECT 89.5 08/06/2007 0951     Wt Readings from Last 3 Encounters:  08/19/17 135 lb (61.2 kg)  07/28/17 136 lb 8 oz (61.9 kg)  07/02/17 137 lb 12.8 oz (62.5 kg)     Other studies Reviewed: Additional studies/ records that were reviewed today include: . Review of the above records demonstrates:     Assessment and Plan:   1. CAD s/p CABG without angina: She has no chest pain. Lexiscan stress myoview January 2016 with no ischemia. Will continue ASA, Crestor, Toprol and Zetia. I think she can stop the Plavix.  2. CAROTID ARTERY DISEASE: She has moderate bilateral carotid artery disease, stable by dopplers June 2018. Will not repeat at this time.   3. HTN: BP is controlled.   4. HLD: Followed in primary care. Continue statin.   Current medicines are reviewed at length with the patient today.  The patient does not have concerns regarding medicines.  The following changes have been made:  no change  Labs/ tests ordered today include:   No orders of the defined types were placed in this encounter.   Disposition:   FU with me in 12 months  Signed, Lauree Chandler, MD 08/19/2017 11:55 AM    [facility] [place], [place], [place]  [address] Phone: [phone]; Fax: [phone]

## 2017-08-20 NOTE — Addendum Note (Signed)
Addended by: Mendel Ryder on: 08/20/2017 02:41 PM   Modules accepted: Orders

## 2017-09-01 ENCOUNTER — Other Ambulatory Visit: Payer: Self-pay | Admitting: Internal Medicine

## 2017-09-01 ENCOUNTER — Ambulatory Visit (INDEPENDENT_AMBULATORY_CARE_PROVIDER_SITE_OTHER): Payer: Medicare Other | Admitting: Internal Medicine

## 2017-09-01 ENCOUNTER — Encounter: Payer: Self-pay | Admitting: Internal Medicine

## 2017-09-01 VITALS — BP 118/62 | HR 62 | Temp 97.7°F | Ht 60.0 in | Wt 136.0 lb

## 2017-09-01 DIAGNOSIS — E118 Type 2 diabetes mellitus with unspecified complications: Secondary | ICD-10-CM

## 2017-09-01 DIAGNOSIS — R441 Visual hallucinations: Secondary | ICD-10-CM | POA: Diagnosis not present

## 2017-09-01 DIAGNOSIS — R54 Age-related physical debility: Secondary | ICD-10-CM

## 2017-09-01 DIAGNOSIS — Z79899 Other long term (current) drug therapy: Secondary | ICD-10-CM

## 2017-09-01 DIAGNOSIS — I6523 Occlusion and stenosis of bilateral carotid arteries: Secondary | ICD-10-CM | POA: Diagnosis not present

## 2017-09-01 LAB — POCT GLYCOSYLATED HEMOGLOBIN (HGB A1C): Hemoglobin A1C: 6.1 % — AB (ref 4.0–5.6)

## 2017-09-01 MED ORDER — SITAGLIPTIN PHOSPHATE 50 MG PO TABS
50.0000 mg | ORAL_TABLET | Freq: Every day | ORAL | 3 refills | Status: DC
Start: 2017-09-01 — End: 2017-11-26

## 2017-09-01 MED ORDER — GLUCOSE BLOOD VI STRP: ORAL_STRIP | 99 refills | Status: DC

## 2017-09-01 NOTE — Progress Notes (Signed)
Chief Complaint  Patient presents with  . Follow-up    HPI: Audrey [name] 84 y.o. come in for Chronic disease management  Here with family   FU dm and medication   Has dm underlying vascular disease stable and  ckd and dementia and stable cad  Hx colon cancer  And ht HLD and mgus  Since lat time bg in 120 - 140 ranges   All under 200  Not eating sugars .  Taking 50 of januvia . Needs refill strips   On exelon patch still seeing people  Not as many . numbness right index mid finger  No pain  cv eval stable no changes. No falling   New co  ROS: See pertinent positives and negatives per HPI.  Past Medical History:  Diagnosis Date  . AAA (abdominal aortic aneurysm) (HCC)    small  . Bilateral carotid artery stenosis   . CAD (coronary artery disease)    including LM disease; s/p CABG  . Colon cancer ([place])   . History of chickenpox   . History of colon cancer    s/p resection in 2004  . History of colonoscopy   . HTN (hypertension)   . Hyperlipidemia   . OA (osteoarthritis)    severe; s/p bilateral hip replacement    Family History  Problem Relation Age of Onset  . Cancer Mother        intra-abdominal/gallbladder  . Cholelithiasis Mother   . Heart attack Father        49  . Heart disease Father   . Diabetes Sister        21de  . Other Brother        GSW 50   . Heart attack Sister   . Colon cancer Neg Hx     Social History   Socioeconomic History  . Marital status: Widowed    Spouse name: Not on file  . Number of children: 3  . Years of education: Not on file  . Highest education level: Not on file  Occupational History  . 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 Financial resource strain: Not on file  . Food insecurity:    Worry: Not on file    Inability: Not on file  . Transportation needs:    Medical: Not on file    Non-medical: Not on file  Tobacco Use  . Smoking status:  Never Smoker  . Smokeless tobacco: Never Used  Substance and Sexual Activity  . Alcohol use: No  . Drug use: No  . Sexual activity: Not on file  Lifestyle  . Physical activity:    Days per week: Not on file    Minutes per session: Not on file  . Stress: Not on file  Relationships  . Social connections:    Talks on phone: Not on file    Gets together: Not on file    Attends religious service: Not on file    Active member of club or organization: Not on file    Attends meetings of clubs or organizations: Not on file    Relationship status: Not on file  Other Topics Concern  . 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      g3p3  Children are well  Lives independently by herself no pets family daughter  lives close by 8 hours of sleep at least    Negative TAD   14 years of education      Hearing aid and dentures   No falls .  Has smoke detector and wears seat belts. . No excess sun exposure. . No depression          Outpatient Medications Prior to Visit  Medication Sig Dispense Refill  . AMBULATORY NON FORMULARY MEDICATION Lift chair 1 Device 0  . aspirin (ADULT ASPIRIN EC LOW STRENGTH) 81 MG EC tablet Take 81 mg by mouth daily.      . Cholecalciferol (VITAMIN D3) 2000 units TABS Take 2,000 Units by mouth 2 (two) times daily.    Marland Kitchen ezetimibe (ZETIA) 10 MG tablet Take 1 tablet (10 mg total) by mouth daily. 90 tablet 3  . gabapentin (NEURONTIN) 100 MG capsule Take 1 capsule (100 mg total) by mouth at bedtime. 90 capsule 1  . ibuprofen (ADVIL,MOTRIN) 200 MG tablet Take 400 mg by mouth every 6 (six) hours as needed for mild pain.    . magnesium 30 MG tablet Take 30 mg by mouth at bedtime.    . metoprolol succinate (TOPROL-XL) 50 MG 24 hr tablet Take 1 tablet (50 mg total) by mouth daily. 90 tablet 3  . Multiple Vitamins-Minerals (ALIVE WOMENS 50+ PO) Take  1 tablet by mouth daily.    . rivastigmine (EXELON) 4.6 mg/24hr Place 1 patch (4.6 mg total) onto the skin daily. 90 patch 1  . rosuvastatin (CRESTOR) 40 MG tablet Take 1 tablet (40 mg total) every evening by mouth. 30 tablet 1  . telmisartan (MICARDIS) 20 MG tablet Take 1 tablet (20 mg total) by mouth daily. 90 tablet 3  . vitamin C (ASCORBIC ACID) 500 MG tablet Take 500 mg by mouth daily.    . sitaGLIPtin (JANUVIA) 50 MG tablet Take 0.5 tablets (25 mg total) by mouth daily. 30 tablet 3   No facility-administered medications prior to visit.      EXAM:  BP 118/62 (BP Location: Right Arm, Patient Position: Sitting, Cuff Size: Normal)   Pulse 62   Temp 97.7 F (36.5 C) (Oral)   Ht 5' (1.524 m)   Wt 136 lb (61.7 kg)   SpO2 97%   BMI 26.56 kg/m   Body mass index is 26.56 kg/m.  GENERAL: vitals reviewed and listed above, alert, verbal pleasant  appears well hydrated and in no acute distress in wc  HEENT: atraumatic, conjunctiva  clear, no obvious abnormalities on inspection of external nose and ears  NECK: no obvious masses on inspection palpation  LUNGS: clear to auscultation bilaterally, no wheezes, rales or rhonchi, CV: HRRR, no clubbing cyanosis or  peripheral edema nl cap refill  MS: moves all extremities   Some djd changes   Hands  PSYCH: pleasant and cooperative,  Lab Results  Component Value Date   WBC 6.0 06/25/2017   HGB 12.9 06/25/2017   HCT 38.6 06/25/2017   PLT 162 06/25/2017   GLUCOSE 211 (H) 06/25/2017   CHOL 114 10/03/2015   TRIG 142.0 10/03/2015   HDL 44.80 10/03/2015   LDLDIRECT 89.5 08/06/2007   LDLCALC 41 10/03/2015   ALT 47 06/25/2017   AST 33 06/25/2017   NA 133 (L) 06/25/2017   K 5.0 06/25/2017   CL 101 06/25/2017   CREATININE 1.57 (H) 06/25/2017   BUN 33 (H) 06/25/2017   CO2 24 06/25/2017   TSH 4.21  10/07/2016   HGBA1C 6.1 (A) 09/01/2017   BP Readings from Last 3 Encounters:  09/01/17 118/62  08/19/17 128/60  07/28/17 (!) 90/58     ASSESSMENT AND PLAN:  Discussed the following assessment and plan:  Type 2 diabetes mellitus with complication, without long-term current use of insulin (HCC) - much improved  - Plan: POC HgB A1c  Medication management  Advanced age  Visual hallucination Now controlled   On januvia   To minimize risk of lows and has had sig dietary changes .  Most problematic  at this time is the  dementia with hallucinations .  Continue  With cautions  ROV in 6 months  ( can cancel the august appt)  -Patient advised to return or notify health care team  if  new concerns arise.  Patient Instructions  Glad you are doing better      Plan OV in 6 months  Or as needed     Mariann Laster K. Panosh M.D.

## 2017-09-01 NOTE — Patient Instructions (Signed)
Glad you are doing better      Plan OV in 6 months  Or as needed

## 2017-09-11 ENCOUNTER — Other Ambulatory Visit: Payer: Self-pay | Admitting: Internal Medicine

## 2017-09-11 NOTE — Telephone Encounter (Signed)
Pt's daughter requesting generic brand of Januvia to be sent to [facility], which pt's daughter Rudy Jew she was getting previously from the [place]. Brand name of medication is expensive and cost $574.55 for a 30 day supply.

## 2017-09-11 NOTE — Telephone Encounter (Signed)
Copied from [place]. Topic: Quick Communication - Rx Refill/Question >> Sep 11, 2017 11:55 AM Antonieta Iba C wrote: Medication: sitaGLIPtin (JANUVIA) 50 MG tablet  Has the patient contacted their pharmacy? No  (Agent: If no, request that the patient contact the pharmacy for the refill.) (Agent: If yes, when and what did the pharmacy advise?)  Preferred Pharmacy (with phone number or street name): [place], [place]. [phone] (Phone) [phone] (Fax)      Agent: Please be advised that RX refills may take up to 3 business days. We ask that you follow-up with your pharmacy.

## 2017-09-11 NOTE — Telephone Encounter (Signed)
Left message for pt's daughter to return call to office regarding refill of Januvia 50mg  tab. [facility] called and pharmacist states that the pt had a prescription on file from 04/2017 that was not filled. Pharmacist states that out of pocket cost would be $574.55 for a 30 day supply. Pharmacist states that the pt does not have any prescription insurance on file.

## 2017-09-14 NOTE — Telephone Encounter (Signed)
Please advise Dr Panosh, thanks.   

## 2017-09-14 NOTE — Telephone Encounter (Signed)
If there is a generic please send it in    To [facility]

## 2017-09-18 MED ORDER — SITAGLIPTIN PHOSPHATE 50 MG PO TABS: 50.0000 mg | ORAL_TABLET | Freq: Every day | ORAL | 0 refills | Status: DC

## 2017-09-18 NOTE — Telephone Encounter (Signed)
Request for generic sent to pharmacy.  Pharmacy will contact office if unable to fill generic.

## 2017-09-18 NOTE — Telephone Encounter (Signed)
LM for pt daughter Neoma Laming to return call if medication is not able to be filled as generic

## 2017-09-29 ENCOUNTER — Telehealth: Payer: Self-pay | Admitting: Family Medicine

## 2017-09-29 NOTE — Telephone Encounter (Signed)
Received a fax from [facility] does not come in generic. I spoke with patient and the script is there, pt will have son call the office back if we need to change medications.

## 2017-10-13 ENCOUNTER — Telehealth: Payer: Self-pay | Admitting: Neurology

## 2017-10-13 MED ORDER — RIVASTIGMINE 9.5 MG/24HR TD PT24: 9.5000 mg | MEDICATED_PATCH | Freq: Every day | TRANSDERMAL | 1 refills | Status: DC

## 2017-10-13 NOTE — Telephone Encounter (Signed)
Increase patch to 9.6 mg

## 2017-10-13 NOTE — Telephone Encounter (Signed)
Tried to call son. No answer. VM is full.

## 2017-10-13 NOTE — Telephone Encounter (Signed)
Spoke with son. RX sent to [facility] per his request.

## 2017-10-13 NOTE — Telephone Encounter (Signed)
I do not have a release to speak with daughter-in-law.   I called patient's son. He states her hallucinations are getting worse than they have ever been. The exelon patch helped for 2 weeks but hallucinations now even worse than before. When I let him know that exelon usually helps with memory loss progression he stated her "memory is as sharp as a tack." No recent illnesses. No urinary symptoms. He asked about increasing patch. It looks like she is on 4.5 mg patch. She has been on Seroquel before with side effects. It looks like Nuplazid was discussed but wasn't tried.   Please advise.

## 2017-10-13 NOTE — Telephone Encounter (Signed)
Patient daughter in law Mortimer Fries called and needs to talk to someone about the dosage change on the patch medication. Please call

## 2017-10-20 ENCOUNTER — Other Ambulatory Visit: Payer: Self-pay

## 2017-10-20 MED ORDER — METOPROLOL SUCCINATE ER 50 MG PO TB24: 50.0000 mg | ORAL_TABLET | Freq: Every day | ORAL | 3 refills | Status: DC

## 2017-10-20 MED ORDER — TELMISARTAN 20 MG PO TABS: 20.0000 mg | ORAL_TABLET | Freq: Every day | ORAL | 3 refills | Status: DC

## 2017-10-20 MED ORDER — EZETIMIBE 10 MG PO TABS: 10.0000 mg | ORAL_TABLET | Freq: Every day | ORAL | 3 refills | Status: DC

## 2017-10-20 MED ORDER — ROSUVASTATIN CALCIUM 40 MG PO TABS: 40.0000 mg | ORAL_TABLET | Freq: Every evening | ORAL | 2 refills | Status: DC

## 2017-10-27 ENCOUNTER — Ambulatory Visit: Payer: Medicare Other | Admitting: Internal Medicine

## 2017-10-27 ENCOUNTER — Other Ambulatory Visit: Payer: Self-pay | Admitting: Neurology

## 2017-10-29 ENCOUNTER — Other Ambulatory Visit: Payer: Self-pay

## 2017-10-29 ENCOUNTER — Telehealth: Payer: Self-pay | Admitting: Internal Medicine

## 2017-10-29 MED ORDER — SITAGLIPTIN PHOSPHATE 50 MG PO TABS: 50.0000 mg | ORAL_TABLET | Freq: Every day | ORAL | 0 refills | Status: DC

## 2017-10-29 NOTE — Telephone Encounter (Signed)
Copied from [place] [id number]. Topic: Quick Communication - Rx Refill/Question >> Oct 29, 2017 10:33 AM Selinda Flavin B, NT wrote: **30 days supply request to go to [id number] - [place], [place] while waiting on the [facility] to fill the 90 day supply**  Medication: sitaGLIPtin (JANUVIA) 50 MG tablet (90 day supply)   Has the patient contacted their pharmacy? Yes.   (Agent: If no, request that the patient contact the pharmacy for the refill.) (Agent: If yes, when and what did the pharmacy advise?)  Preferred Pharmacy (with phone number or street name): [facility] - [place], [place] - [address]  Agent: Please be advised that RX refills may take up to 3 business days. We ask that you follow-up with your pharmacy.

## 2017-11-18 ENCOUNTER — Telehealth: Payer: Self-pay | Admitting: Internal Medicine

## 2017-11-18 NOTE — Telephone Encounter (Signed)
Copied from [place] [id number]. Topic: Quick Communication - Rx Refill/Question >> Nov 18, 2017  4:28 PM Bea Graff, NT wrote: Medication: sitaGLIPtin (JANUVIA) 50 MG tablet 90 day rx   Has the patient contacted their pharmacy? Yes.   (Agent: If no, request that the patient contact the pharmacy for the refill.) (Agent: If yes, when and what did the pharmacy advise?)  Preferred Pharmacy (with phone number or street name): [facility] - [place], [place] - [address] (Phone) [phone] (Fax)    Agent: Please be advised that RX refills may take up to 3 business days. We ask that you follow-up with your pharmacy.

## 2017-11-18 NOTE — Telephone Encounter (Signed)
Attempted to call patient's son regarding the refill on the prescription for Januvia. Left message that he can check with [facility] for refill on this med and to see if he is attempting to change his pharmacies. Requesting him to call back clarify this.

## 2017-11-19 NOTE — Telephone Encounter (Signed)
Noted  

## 2017-11-19 NOTE — Telephone Encounter (Signed)
Copied from [place] [id number]. Topic: Quick Communication - Rx Refill/Question >> Nov 18, 2017  5:26 PM Keene Breath wrote: Patient returned call and said that she would call [facility] and if she is going to change pharmacy she would call back and let us know.

## 2017-11-26 MED ORDER — SITAGLIPTIN PHOSPHATE 50 MG PO TABS: 50.0000 mg | ORAL_TABLET | Freq: Every day | ORAL | 1 refills | Status: DC

## 2017-11-26 NOTE — Telephone Encounter (Signed)
Daughter has called multiple times trying to get this Rx  sitaGLIPtin (JANUVIA) 50 MG tablet   90 day supply  Sent to   [facility] [place], [place] - [address] [phone] (Phone) [phone] (Fax)   Does not want to get at local pharmacy.  The only reason sent to local pharmacy is that [facility] never got and pt was out. Please send today in order for pt not to be out again. (Was $500 at local pharmacy)

## 2017-11-26 NOTE — Telephone Encounter (Signed)
Januvia has been sent to the [facility] Daughter Audrey [name] has been made aware via voicemail.  Nothing further needed.
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
## 2017-12-08 ENCOUNTER — Encounter: Payer: Self-pay | Admitting: Neurology

## 2017-12-08 ENCOUNTER — Ambulatory Visit (INDEPENDENT_AMBULATORY_CARE_PROVIDER_SITE_OTHER): Payer: Medicare Other | Admitting: Neurology

## 2017-12-08 VITALS — BP 134/64 | HR 60 | Ht 61.0 in | Wt 136.0 lb

## 2017-12-08 DIAGNOSIS — I6523 Occlusion and stenosis of bilateral carotid arteries: Secondary | ICD-10-CM | POA: Diagnosis not present

## 2017-12-08 DIAGNOSIS — R441 Visual hallucinations: Secondary | ICD-10-CM | POA: Diagnosis not present

## 2017-12-08 MED ORDER — PIMAVANSERIN TARTRATE 34 MG PO CAPS: 1.0000 | ORAL_CAPSULE | Freq: Every day | ORAL | 0 refills | Status: DC

## 2017-12-08 MED ORDER — PIMAVANSERIN TARTRATE 34 MG PO CAPS: 1.0000 | ORAL_CAPSULE | Freq: Every day | ORAL | 1 refills | Status: DC

## 2017-12-08 NOTE — Patient Instructions (Addendum)
Start nuplazid - 34 mg - 1 tablet daily.  Call me in a month to see how those are doing

## 2018-03-02 ENCOUNTER — Ambulatory Visit: Payer: Medicare Other | Admitting: Internal Medicine

## 2018-03-02 ENCOUNTER — Telehealth: Payer: Self-pay | Admitting: Neurology

## 2018-03-02 DIAGNOSIS — R441 Visual hallucinations: Secondary | ICD-10-CM

## 2018-03-02 MED ORDER — OLANZAPINE 2.5 MG PO TABS: 2.5000 mg | ORAL_TABLET | Freq: Every day | ORAL | 0 refills | Status: DC

## 2018-03-02 NOTE — Telephone Encounter (Signed)
Patient's daughter in law called in on behalf of the patient. The patient was instructed by Dr. Carles Collet to call in 6 weeks after being on a medication and tell how she was doing. She has not noticed any changes. She said Dr. Carles Collet had mentioned referring her to another Doctor but the patient wants to know if She thinks it will really help or be beneficial? Please Call. Thanks

## 2018-03-02 NOTE — Telephone Encounter (Signed)
Patient was started on Nuplazid.   From last office visit note: "Pt going to call me in a month to see how doing.  If still with hallucinations, will order neuroimaging and likely refer for another opinion as she has tried/failed many medications"  Please advise.

## 2018-03-02 NOTE — Telephone Encounter (Signed)
Spoke with patient's daughter-in-law. She states patient would be willing to undergo MR. Please advise on this (MR Brain w/o? Dx: hallucinations?)  They are really not interested in another opinion. She is doing well otherwise. She is willing to try another medication. Please advise on instructions for medication.

## 2018-03-02 NOTE — Telephone Encounter (Signed)
Stop nuplazid.   Start zyprexa, 2.5 mg daily.  Call me in 2 weeks.  May need to increase.  Risks are same as with nuplazid and seroquel.  Same black box warning that we discussed with the others

## 2018-03-02 NOTE — Telephone Encounter (Signed)
MR Brain entered.   Zyprexa sent to [facility]. Patient put in reminders to call in two weeks.   Patient's daughter-in-law made aware.

## 2018-03-02 NOTE — Telephone Encounter (Signed)
1.  Lets get the MRI if they are willing 2.  IF they don't want to get another opinion, we can try other meds, but I just thought that another opinion couldn't hurt (and may take a LONG time to get in as well).  In the meantime, we can still try other meds if they would like?

## 2018-03-08 ENCOUNTER — Ambulatory Visit: Payer: Medicare Other | Admitting: Internal Medicine

## 2018-03-22 ENCOUNTER — Telehealth: Payer: Self-pay | Admitting: Neurology

## 2018-03-22 NOTE — Telephone Encounter (Signed)
Left message on machine for patient's daughter to call back.   

## 2018-03-22 NOTE — Telephone Encounter (Signed)
Patient had a med change and is now drowsy with new meds. She stares into space; out of it. Wanting to know about MRI- wants to know exactly why the MRI is necessary. Please call her back at [phone]. Thanks!

## 2018-03-22 NOTE — Telephone Encounter (Signed)
Spoke with daughter-in-law and let her know what Dr. Carles Collet advised.

## 2018-03-22 NOTE — Telephone Encounter (Signed)
Spoke with daughter in Sports coach. She was reminded again that MR is to r/o any reason for hallucinations. They have this scheduled next week. She states since patient started Zyprexa she has been acting "drugged". She just stares into space. Still having hallucinations. She did cut the pill in half the last couple days, but not much better. Please advise.

## 2018-03-22 NOTE — Telephone Encounter (Signed)
Caretaker: She said that the 1/2 pill is working for the patient and MRI tomorrow AM at 9. Thanks! Just FYI

## 2018-03-22 NOTE — Telephone Encounter (Signed)
Can d/c it if making too drowsy

## 2018-03-23 ENCOUNTER — Telehealth: Payer: Self-pay | Admitting: Neurology

## 2018-03-23 ENCOUNTER — Ambulatory Visit
Admission: RE | Admit: 2018-03-23 | Discharge: 2018-03-23 | Disposition: A | Discharge status: Home / Self Care | Payer: Medicare Other | Source: Ambulatory Visit | Attending: Neurology | Admitting: Neurology

## 2018-03-23 DIAGNOSIS — R441 Visual hallucinations: Secondary | ICD-10-CM

## 2018-03-23 NOTE — Telephone Encounter (Signed)
Let pt/family know that nothing to explain the hallucinations on MRI.  Just the small vessel disease.  I have tried most of what I know to try for the hallucinations and pt either with SE or hasn't helped.  If agreeable, refer to Dr. Clovis Pu

## 2018-03-23 NOTE — Telephone Encounter (Signed)
Spoke with patient's daughter in law and son and made them aware of MR results. They will think about referral to Dr. Clovis Pu and discuss with patient. They will call back to let us know if that is something she is willing to do.

## 2018-03-30 ENCOUNTER — Encounter: Payer: Self-pay | Admitting: Internal Medicine

## 2018-03-30 ENCOUNTER — Ambulatory Visit (INDEPENDENT_AMBULATORY_CARE_PROVIDER_SITE_OTHER): Payer: Medicare Other | Admitting: Internal Medicine

## 2018-03-30 VITALS — BP 126/76 | HR 65 | Temp 98.2°F | Wt 136.0 lb

## 2018-03-30 DIAGNOSIS — Z79899 Other long term (current) drug therapy: Secondary | ICD-10-CM

## 2018-03-30 DIAGNOSIS — E118 Type 2 diabetes mellitus with unspecified complications: Secondary | ICD-10-CM

## 2018-03-30 DIAGNOSIS — N184 Chronic kidney disease, stage 4 (severe): Secondary | ICD-10-CM | POA: Diagnosis not present

## 2018-03-30 DIAGNOSIS — Z2821 Immunization not carried out because of patient refusal: Secondary | ICD-10-CM

## 2018-03-30 DIAGNOSIS — R54 Age-related physical debility: Secondary | ICD-10-CM

## 2018-03-30 LAB — POCT GLYCOSYLATED HEMOGLOBIN (HGB A1C): Hemoglobin A1C: 6 % — AB (ref 4.0–5.6)

## 2018-03-30 MED ORDER — SITAGLIPTIN PHOSPHATE 50 MG PO TABS: 50.0000 mg | ORAL_TABLET | Freq: Every day | ORAL | 0 refills | Status: DC

## 2018-03-30 NOTE — Progress Notes (Signed)
Chief Complaint  Patient presents with  . Follow-up    Pt is healthy and is doing well checks blood sugar everything 3 days     HPI: Audrey [name] 85 y.o. come in for Chronic disease management  For DM here with son. Taking januvia 50 per day and doing well no lows cut out bread and sugars  Getting at the [place]  BG125  145 range . No current fever swelling  New sx   Declines flu vaccine cause made her sick years ago      undercare from  Cards for bp and lipid meds  Hem for mgus  Neuro for memory and cns rls etc Using gaba for rls seem to help  ROS: See pertinent positives and negatives per HPI.  Past Medical History:  Diagnosis Date  . AAA (abdominal aortic aneurysm) (HCC)    small  . Bilateral carotid artery stenosis   . CAD (coronary artery disease)    including LM disease; s/p CABG  . Colon cancer ([place])   . History of chickenpox   . History of colon cancer    s/p resection in 2004  . History of colonoscopy   . HTN (hypertension)   . Hyperlipidemia   . OA (osteoarthritis)    severe; s/p bilateral hip replacement    Family History  Problem Relation Age of Onset  . Cancer Mother        intra-abdominal/gallbladder  . Cholelithiasis Mother   . Heart attack Father        97  . Heart disease Father   . Diabetes Sister        75de  . Other Brother        GSW 55   . Heart attack Sister   . Colon cancer Neg Hx     Social History   Socioeconomic History  . Marital status: Widowed    Spouse name: Not on file  . Number of children: 3  . Years of education: Not on file  . Highest education level: Not on file  Occupational History  . 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 Financial resource strain: Not on file  . Food insecurity:    Worry: Not on file    Inability: Not on file  . Transportation needs:    Medical: Not on file    Non-medical: Not on file  Tobacco Use  . Smoking status:  Never Smoker  . Smokeless tobacco: Never Used  Substance and Sexual Activity  . Alcohol use: No  . Drug use: No  . Sexual activity: Not on file  Lifestyle  . Physical activity:    Days per week: Not on file    Minutes per session: Not on file  . Stress: Not on file  Relationships  . Social connections:    Talks on phone: Not on file    Gets together: Not on file    Attends religious service: Not on file    Active member of club or organization: Not on file    Attends meetings of clubs or organizations: Not on file    Relationship status: Not on file  Other Topics Concern  . 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      g3p3  Children  are well   Lives independently by herself no pets family daughter  lives close by 8 hours of sleep at least    Negative TAD   14 years of education      Hearing aid and dentures   No falls .  Has smoke detector and wears seat belts. . No excess sun exposure. . No depression          Outpatient Medications Prior to Visit  Medication Sig Dispense Refill  . aspirin (ADULT ASPIRIN EC LOW STRENGTH) 81 MG EC tablet Take 81 mg by mouth daily.      . Cholecalciferol (VITAMIN D3) 2000 units TABS Take 2,000 Units by mouth 2 (two) times daily.    Marland Kitchen ezetimibe (ZETIA) 10 MG tablet Take 1 tablet (10 mg total) by mouth daily. 90 tablet 3  . gabapentin (NEURONTIN) 100 MG capsule TAKE 1 CAPSULE AT BEDTIME. 90 capsule 1  . ibuprofen (ADVIL,MOTRIN) 200 MG tablet Take 400 mg by mouth every 6 (six) hours as needed for mild pain.    . magnesium 30 MG tablet Take 30 mg by mouth at bedtime.    . metoprolol succinate (TOPROL-XL) 50 MG 24 hr tablet Take 1 tablet (50 mg total) by mouth daily. 90 tablet 3  . Multiple Vitamins-Minerals (ALIVE WOMENS 50+ PO) Take 1 tablet by mouth daily.    Marland Kitchen OLANZapine (ZYPREXA) 2.5 MG tablet Take 1 tablet (2.5  mg total) by mouth at bedtime. 30 tablet 0  . rivastigmine (EXELON) 9.5 mg/24hr Place 1 patch (9.5 mg total) onto the skin daily. 90 patch 1  . rosuvastatin (CRESTOR) 40 MG tablet Take 1 tablet (40 mg total) by mouth every evening. 90 tablet 2  . telmisartan (MICARDIS) 20 MG tablet Take 1 tablet (20 mg total) by mouth daily. 90 tablet 3  . TRUE METRIX BLOOD GLUCOSE TEST test strip CHECK BLOOD SUGAR ONCE OR TWICE A DAY OR AS DIRECTED BY MD. 50 each 11  . vitamin C (ASCORBIC ACID) 500 MG tablet Take 500 mg by mouth daily.    . sitaGLIPtin (JANUVIA) 50 MG tablet Take 1 tablet (50 mg total) by mouth daily. 30 tablet 0  . Pimavanserin Tartrate (NUPLAZID) 34 MG CAPS Take 1 tablet by mouth daily. (Patient not taking: Reported on 03/30/2018) 90 capsule 1   No facility-administered medications prior to visit.      EXAM:  BP 126/76 (BP Location: Right Arm, Patient Position: Sitting, Cuff Size: Normal)   Pulse 65   Temp 98.2 F (36.8 C) (Oral)   Wt 136 lb (61.7 kg)   BMI 25.70 kg/m   Body mass index is 25.7 kg/m.  GENERAL: vitals reviewed and listed above, alert, pleasant  appears well hydrated and in no acute distress in wc personable HEENT: atraumatic, conjunctiva  clear, no obvious abnormalities on inspection of external nose and ears  NECK: no obvious masses on inspection palpation  LUNGS: clear to auscultation bilaterally, no wheezes, rales or rhonchi,CV: HRRR, no clubbing cyanosis or  peripheral edema nl cap refill  MS: moves all extremities without noticeable focal  abnormality PSYCH: pleasant and cooperative,  No tremor  Lab Results  Component Value Date   WBC 6.0 06/25/2017   HGB 12.9 06/25/2017   HCT 38.6 06/25/2017   PLT 162 06/25/2017   GLUCOSE 211 (H) 06/25/2017   CHOL 114 10/03/2015   TRIG 142.0 10/03/2015   HDL 44.80 10/03/2015   LDLDIRECT 89.5 08/06/2007   LDLCALC 41 10/03/2015   ALT 47  06/25/2017   AST 33 06/25/2017   NA 133 (L) 06/25/2017   K 5.0 06/25/2017   CL  101 06/25/2017   CREATININE 1.57 (H) 06/25/2017   BUN 33 (H) 06/25/2017   CO2 24 06/25/2017   TSH 4.21 10/07/2016   HGBA1C 6.0 (A) 03/30/2018   BP Readings from Last 3 Encounters:  03/30/18 126/76  12/08/17 134/64  09/01/17 118/62   Wt Readings from Last 3 Encounters:  03/30/18 136 lb (61.7 kg)  12/08/17 136 lb (61.7 kg)  09/01/17 136 lb (61.7 kg)   ASSESSMENT AND PLAN:  Discussed the following assessment and plan:  Type 2 diabetes mellitus with complication, without long-term current use of insulin (HCC) - Plan: POC HgB A1c  Medication management - Plan: POC HgB A1c  Advanced age  CKD (chronic kidney disease), stage IV ([place])  Influenza vaccination declined by patient  is excellent  Since her last gfr was low and doing well dec to 25 mg per day  [place] and consider off at next visit if controlled   Declined flu vaccine and offered to do blood monitoring but can do this at her speciality office as planned  -Patient advised to return or notify health care team  if  new concerns arise.  Patient Instructions  Glad you are doing well.  Due for cholesterol and  Chemistry panel  In April   a1c is   Very good  And we can decrease the dose  To 25 mg per day     And then  Check hg a1c in 6 months     There may come a time we could even try off medication      Mariann Laster K. Ethelyn Cerniglia M.D.

## 2018-03-30 NOTE — Patient Instructions (Addendum)
Glad you are doing well.  Due for cholesterol and  Chemistry panel  In April   a1c is   Very good  And we can decrease the dose  To 25 mg per day     And then  Check hg a1c in 6 months     There may come a time we could even try off medication

## 2018-04-08 ENCOUNTER — Other Ambulatory Visit: Payer: Self-pay | Admitting: Neurology

## 2018-04-11 ENCOUNTER — Other Ambulatory Visit: Payer: Self-pay | Admitting: Neurology

## 2018-04-13 ENCOUNTER — Telehealth: Payer: Self-pay | Admitting: Neurology

## 2018-04-13 MED ORDER — RIVASTIGMINE 9.5 MG/24HR TD PT24: 9.5000 mg | MEDICATED_PATCH | Freq: Every day | TRANSDERMAL | 1 refills | Status: DC

## 2018-04-13 NOTE — Telephone Encounter (Signed)
No refills on patch and gabapentin. Patch needs to go to the [place] and then the gabapentin needs to go [facility]. Please send these in. Thanks!

## 2018-04-13 NOTE — Telephone Encounter (Signed)
Gabapentin was sent to [facility] on 04/11/2018 when the request was sent over from the pharmacy.  Exelon sent to [facility].

## 2018-05-17 ENCOUNTER — Other Ambulatory Visit: Payer: Self-pay | Admitting: Internal Medicine

## 2018-05-17 MED ORDER — SITAGLIPTIN PHOSPHATE 50 MG PO TABS: 50.0000 mg | ORAL_TABLET | Freq: Every day | ORAL | 2 refills | Status: DC

## 2018-05-17 NOTE — Telephone Encounter (Signed)
Copied from [place] [id number]. Topic: Quick Communication - Rx Refill/Question >> May 17, 2018  3:53 PM Oneta Rack wrote:  Medication: sitaGLIPtin (JANUVIA) 50 MG tablet   Has the patient contacted their pharmacy? Yes   (Agent: If yes, when and what did the pharmacy advise?) 2 request was sent over last week.   Preferred Pharmacy (with phone number or street name):  [facility] - [place], [place] - [address]  Agent: Please be advised that RX refills may take up to 3 business days. We ask that you follow-up with your pharmacy.

## 2018-05-27 ENCOUNTER — Telehealth: Payer: Self-pay | Admitting: Neurology

## 2018-05-27 DIAGNOSIS — F0280 Dementia in other diseases classified elsewhere without behavioral disturbance: Secondary | ICD-10-CM

## 2018-05-27 DIAGNOSIS — G301 Alzheimer's disease with late onset: Principal | ICD-10-CM

## 2018-05-27 MED ORDER — AMBULATORY NON FORMULARY MEDICATION: 0 refills | Status: DC

## 2018-05-27 NOTE — Telephone Encounter (Signed)
Okay to write for lift chair?

## 2018-05-27 NOTE — Telephone Encounter (Signed)
ok 

## 2018-05-27 NOTE — Telephone Encounter (Signed)
Prescription for a lift chair. Please write and let her know when it's ready if possible. Thanks!

## 2018-05-27 NOTE — Telephone Encounter (Signed)
LMOM letting Bobbie know RX is written and at the front for pick up.

## 2018-06-21 ENCOUNTER — Telehealth: Payer: Self-pay

## 2018-06-21 NOTE — Telephone Encounter (Signed)
Called and spoke with Ms. Leonides Schanz. She was unable to hear. Spoke with sitter, she will give message to her children. Scheduling message sent for July appt.

## 2018-06-21 NOTE — Telephone Encounter (Signed)
-----   Message from Heath Lark, MD sent at 06/21/2018  2:35 PM EDT ----- Regarding: reschedule to July I recommend reschedule her appt to July Labs to be done 1 week prior

## 2018-06-23 ENCOUNTER — Telehealth: Payer: Self-pay | Admitting: Hematology and Oncology

## 2018-06-23 NOTE — Telephone Encounter (Signed)
R/s appt per 3/30 sch message - pt aware of new apt

## 2018-06-24 ENCOUNTER — Other Ambulatory Visit: Payer: Medicare Other

## 2018-07-01 ENCOUNTER — Ambulatory Visit: Payer: Medicare Other | Admitting: Hematology and Oncology

## 2018-07-12 ENCOUNTER — Other Ambulatory Visit: Payer: Self-pay | Admitting: Neurology

## 2018-07-12 ENCOUNTER — Other Ambulatory Visit: Payer: Self-pay | Admitting: Cardiovascular Disease

## 2018-07-12 ENCOUNTER — Other Ambulatory Visit: Payer: Self-pay | Admitting: Internal Medicine

## 2018-07-12 DIAGNOSIS — G301 Alzheimer's disease with late onset: Principal | ICD-10-CM

## 2018-07-12 DIAGNOSIS — F0280 Dementia in other diseases classified elsewhere without behavioral disturbance: Secondary | ICD-10-CM

## 2018-07-12 MED ORDER — SITAGLIPTIN PHOSPHATE 50 MG PO TABS: 50.0000 mg | ORAL_TABLET | Freq: Every day | ORAL | 2 refills | Status: DC

## 2018-07-12 MED ORDER — METOPROLOL SUCCINATE ER 50 MG PO TB24: 50.0000 mg | ORAL_TABLET | Freq: Every day | ORAL | 0 refills | Status: DC

## 2018-07-12 MED ORDER — TELMISARTAN 20 MG PO TABS: 20.0000 mg | ORAL_TABLET | Freq: Every day | ORAL | 0 refills | Status: DC

## 2018-07-12 MED ORDER — EZETIMIBE 10 MG PO TABS: 10.0000 mg | ORAL_TABLET | Freq: Every day | ORAL | 0 refills | Status: DC

## 2018-07-12 NOTE — Telephone Encounter (Signed)
Pt's medications were sent to pt's pharmacy as requested. Confirmation received.  

## 2018-07-12 NOTE — Telephone Encounter (Signed)
Patient's daughter in law called Mortimer Fries) regarding her needing a refill for her Rivastigmine 905 MG. The refill needs to be sent through the [place].  Thanks

## 2018-07-12 NOTE — Telephone Encounter (Signed)
Patient has appointment in July for follow up.  Requested Prescriptions  Pending Prescriptions Disp Refills  . sitaGLIPtin (JANUVIA) 50 MG tablet 30 tablet 2    Sig: Take 1 tablet (50 mg total) by mouth daily.     Endocrinology:  Diabetes - DPP-4 Inhibitors Failed - 07/12/2018  2:24 PM      Failed - Cr in normal range and within 360 days    Creatinine  Date Value Ref Range Status  04/24/2016 1.2 (H) 0.6 - 1.1 mg/dL Final   Creatinine, Ser  Date Value Ref Range Status  06/25/2017 1.57 (H) 0.60 - 1.10 mg/dL Final         Passed - HBA1C is between 0 and 7.9 and within 180 days    Hemoglobin A1C  Date Value Ref Range Status  03/30/2018 6.0 (A) 4.0 - 5.6 % Final   Hgb A1c MFr Bld  Date Value Ref Range Status  04/29/2017 8.4 (H) 4.6 - 6.5 % Final    Comment:    Glycemic Control Guidelines for People with Diabetes:Non Diabetic:  <6%Goal of Therapy: <7%Additional Action Suggested:  >8%          Passed - Valid encounter within last 6 months    Recent Outpatient Visits          3 months ago Type 2 diabetes mellitus with complication, without long-term current use of insulin ([place])   [place] [facility], Standley Brooking, MD   10 months ago Type 2 diabetes mellitus with complication, without long-term current use of insulin ([place])   [facility], Standley Brooking, MD   1 year ago Uncontrolled type 2 diabetes mellitus with hyperglycemia ([place])   [facility], Standley Brooking, MD   1 year ago Uncontrolled type 2 diabetes mellitus with hyperglycemia ([place])   [facility], Standley Brooking, MD   1 year ago Essential hypertension   [facility], Standley Brooking, MD      Future Appointments            In 2 months [place], Standley Brooking, MD [place] at [place], [facility]

## 2018-07-13 MED ORDER — RIVASTIGMINE 9.5 MG/24HR TD PT24: 9.5000 mg | MEDICATED_PATCH | Freq: Every day | TRANSDERMAL | 0 refills | Status: DC

## 2018-07-13 NOTE — Telephone Encounter (Signed)
You may try a 90 day supply but she doesn't have a follow up here at all and hasnt' been seen in a while.  Go ahead and send 90 days no refills.  They can schedule evisit if they would like or they can just have PCP refill in the future since I am not really changing any meds.

## 2018-07-13 NOTE — Telephone Encounter (Signed)
I spoke with the patients son and explained to him that the rx is not able to be e-scribed. He stated the last rx was e-scribed and refuses for [place] not to try and e-scribe. Expressed to him we can try but should it fail the only option is to pick up an rx.

## 2018-07-13 NOTE — Telephone Encounter (Signed)
[facility] [place]  We generally have to write a RX, give it to the family, they have to take it to the [place] and a physician there will re-write it (if they will and are established there).  We can send it to a local pharmacy or give the family a hand written RX for them to take.

## 2018-07-25 ENCOUNTER — Other Ambulatory Visit: Payer: Self-pay | Admitting: Neurology

## 2018-07-25 ENCOUNTER — Telehealth: Payer: Self-pay | Admitting: Neurology

## 2018-07-25 NOTE — Telephone Encounter (Signed)
Let pt/family know that I received a RF request for gabapentin and will refill x 30 days but she needs evisit (has no appt on my schedule at all and not seen since sept).

## 2018-07-26 ENCOUNTER — Telehealth: Payer: Self-pay | Admitting: *Deleted

## 2018-07-26 NOTE — Telephone Encounter (Signed)
Called and spoke with patient to let her know about the refill and appt. She states that I would have to call Liliane Channel ( son) @ [phone] I left him a message to please call us back. I will follow up on this if we have not heard from him

## 2018-07-26 NOTE — Telephone Encounter (Signed)
I placed call to pt regarding upcoming appointment on 5/8.  I spoke with pt and her sitter.  Pt does not have smart phone so is aware this will be phone call visit. Pt asked I contact [name] [name] to discuss appointment.  Phone number is [phone].  Alternate number for Audrey [name] [name] is [phone].   Appt time is 5/8 at 2:30     Virtual Visit Pre-Appointment Phone Call  "(Name), I am calling you today to discuss your upcoming appointment. We are currently trying to limit exposure to the virus that causes COVID-19 by seeing patients at home rather than in the office."  "What is the BEST phone number to call the day of the visit?" -[phone]  This is her son's number.  He will give phone to pt.  1. Do you have or have access to (through a family member/friend) a smartphone with video capability that we can use for your visit?" a. If yes - list this number in appt notes as cell (if different from BEST phone #) and list the appointment type as a VIDEO visit in appointment notes b. If no - list the appointment type as a PHONE visit in appointment notes  Pt does not have smart phone  2. Confirm consent - "In the setting of the current Covid19 crisis, you are scheduled for a (phone or video) visit with your provider on (date) at (time).  Just as we do with many in-office visits, in order for you to participate in this visit, we must obtain consent.  If you'd like, I can send this to your mychart (if signed up) or email for you to review.  Otherwise, I can obtain your verbal consent now.  All virtual visits are billed to your insurance company just like a normal visit would be.  By agreeing to a virtual visit, we'd like you to understand that the technology does not allow for your provider to perform an examination, and thus may limit your provider's ability to fully assess your condition. If your provider identifies any concerns that need to be evaluated in person, we will make arrangements to do so.   Finally, though the technology is pretty good, we cannot assure that it will always work on either your or our end, and in the setting of a video visit, we may have to convert it to a phone-only visit.  In either situation, we cannot ensure that we have a secure connection.  Are you willing to proceed?" STAFF: Did the patient verbally acknowledge consent to telehealth visit? Document YES/NO here: yes.  Pt's son gave consent.   3. Advise patient to be prepared - "Two hours prior to your appointment, go ahead and check your blood pressure, pulse, oxygen saturation, and your weight (if you have the equipment to check those) and write them all down. When your visit starts, your provider will ask you for this information. If you have an Apple Watch or Kardia device, please plan to have heart rate information ready on the day of your appointment. Please have a pen and paper handy nearby the day of the visit as well."  4. Give patient instructions for MyChart download to smartphone OR [email or web address] as below if video visit (depending on what platform provider is using)  5. Inform patient they will receive a phone call 15 minutes prior to their appointment time (may be from unknown caller ID) so they should be prepared to answer    Audrey [name]  [name] has been deemed a candidate for a follow-up tele-health visit to limit community exposure during the Covid-19 pandemic. I spoke with the patient via phone to ensure availability of phone/video source, confirm preferred email & phone number, and discuss instructions and expectations.  I reminded Audrey [name] [name] to be prepared with any vital sign and/or heart rhythm information that could potentially be obtained via home monitoring, at the time of her visit. I reminded Audrey [name] [name] to expect a phone call prior to her visit.  Information discussed with pt's son Audrey [name] Quale, RN 07/26/2018 11:14 AM      FULL LENGTH CONSENT FOR  TELE-HEALTH VISIT   I hereby voluntarily request, consent and authorize [facility] and its employed or contracted physicians, physician assistants, nurse practitioners or other licensed health care professionals (the Practitioner), to provide me with telemedicine health care services (the Services") as deemed necessary by the treating Practitioner. I acknowledge and consent to receive the Services by the Practitioner via telemedicine. I understand that the telemedicine visit will involve communicating with the Practitioner through live audiovisual communication technology and the disclosure of certain medical information by electronic transmission. I acknowledge that I have been given the opportunity to request an in-person assessment or other available alternative prior to the telemedicine visit and am voluntarily participating in the telemedicine visit.  I understand that I have the right to withhold or withdraw my consent to the use of telemedicine in the course of my care at any time, without affecting my right to future care or treatment, and that the Practitioner or I may terminate the telemedicine visit at any time. I understand that I have the right to inspect all information obtained and/or recorded in the course of the telemedicine visit and may receive copies of available information for a reasonable fee.  I understand that some of the potential risks of receiving the Services via telemedicine include:   Delay or interruption in medical evaluation due to technological equipment failure or disruption;  Information transmitted may not be sufficient (e.g. poor resolution of images) to allow for appropriate medical decision making by the Practitioner; and/or   In rare instances, security protocols could fail, causing a breach of personal health information.  Furthermore, I acknowledge that it is my responsibility to provide information about my medical history, conditions and care that is  complete and accurate to the best of my ability. I acknowledge that Practitioner's advice, recommendations, and/or decision may be based on factors not within their control, such as incomplete or inaccurate data provided by me or distortions of diagnostic images or specimens that may result from electronic transmissions. I understand that the practice of medicine is not an exact science and that Practitioner makes no warranties or guarantees regarding treatment outcomes. I acknowledge that I will receive a copy of this consent concurrently upon execution via email to the email address I last provided but may also request a printed copy by calling the office of [place].    I understand that my insurance will be billed for this visit.   I have read or had this consent read to me.  I understand the contents of this consent, which adequately explains the benefits and risks of the Services being provided via telemedicine.   I have been provided ample opportunity to ask questions regarding this consent and the Services and have had my questions answered to my satisfaction.  I give my informed consent for the services to be provided  through the use of telemedicine in my medical care  By participating in this telemedicine visit I agree to the above.

## 2018-07-30 ENCOUNTER — Telehealth (INDEPENDENT_AMBULATORY_CARE_PROVIDER_SITE_OTHER): Payer: Medicare Other | Admitting: Cardiovascular Disease

## 2018-07-30 ENCOUNTER — Other Ambulatory Visit: Payer: Self-pay

## 2018-07-30 ENCOUNTER — Encounter: Payer: Self-pay | Admitting: Cardiovascular Disease

## 2018-07-30 VITALS — Ht 61.0 in | Wt 136.0 lb

## 2018-07-30 DIAGNOSIS — I6523 Occlusion and stenosis of bilateral carotid arteries: Secondary | ICD-10-CM | POA: Diagnosis not present

## 2018-07-30 DIAGNOSIS — E7800 Pure hypercholesterolemia, unspecified: Secondary | ICD-10-CM

## 2018-07-30 DIAGNOSIS — I2510 Atherosclerotic heart disease of native coronary artery without angina pectoris: Secondary | ICD-10-CM

## 2018-07-30 DIAGNOSIS — I10 Essential (primary) hypertension: Secondary | ICD-10-CM | POA: Diagnosis not present

## 2018-07-30 NOTE — Patient Instructions (Signed)
Medication Instructions:  Your provider recommends that you continue on your current medications as directed. Please refer to the Current Medication list given to you today.    Follow-Up: At [facility], you and your health needs are our priority.  As part of our continuing mission to provide you with exceptional heart care, we have created designated Provider Care Teams.  These Care Teams include your primary Cardiologist (physician) and Advanced Practice Providers (APPs -  Physician Assistants and Nurse Practitioners) who all work together to provide you with the care you need, when you need it. You will need a follow up appointment in 6 months.  Please call our office 2 months in advance to schedule this appointment.  You may see Lauree Chandler, MD or one of the following Advanced Practice Providers on your designated Care Team:   [place], PA-C Melina Copa, PA-C . Ermalinda Barrios, PA-C

## 2018-07-30 NOTE — Progress Notes (Signed)
Virtual Visit via Video Note   This visit type was conducted due to national recommendations for restrictions regarding the COVID-19 Pandemic (e.g. social distancing) in an effort to limit this patient's exposure and mitigate transmission in our community.  Due to her co-morbid illnesses, this patient is at least at moderate risk for complications without adequate follow up.  This format is felt to be most appropriate for this patient at this time.  All issues noted in this document were discussed and addressed.  A limited physical exam was performed with this format.  Please refer to the patient's chart for her consent to telehealth for [facility].   Date:  07/30/2018   ID:  Audrey [name], DOB 01-16-1927, MRN [id number]  Patient Location: Home Provider Location: Office  PCP:  Burnis Medin, MD  Cardiologist:  Lauree Chandler, MD  Electrophysiologist:  None   Evaluation Performed:  Follow-Up Visit  Chief Complaint:  Follow up- CAD  History of Present Illness:    Audrey [name] is a 85 y.o. female with history of coronary artery disease s/p 2V CABG in December 2004, HTN, HLD, bilateral carotid artery stenosis, AAA, as well as elevated liver enzymes on Lipitor who is being seen today by virtual e-visit due to the [place] pandemic. She has been followed in the past by Dr. Haroldine Laws. Carotid u/s June 2018 with stable moderate bilateral ICA stenosis. Abdominal u/s 11/18/10 showed very small AAA 2.1x 2.1 cm, stable. Stress test January 2016 with no ischemia.   The patient denies chest pain, dyspnea, palpitations, dizziness, near syncope or syncope. No lower extremity edema.   The patient does not have symptoms concerning for COVID-19 infection (fever, chills, cough, or new shortness of breath).    Past Medical History:  Diagnosis Date  . AAA (abdominal aortic aneurysm) (HCC)    small  . Bilateral carotid artery stenosis   . CAD (coronary artery disease)    including LM  disease; s/p CABG  . Colon cancer ([place])   . History of chickenpox   . History of colon cancer    s/p resection in 2004  . History of colonoscopy   . HTN (hypertension)   . Hyperlipidemia   . OA (osteoarthritis)    severe; s/p bilateral hip replacement   Past Surgical History:  Procedure Laterality Date  . CATARACT EXTRACTION, BILATERAL    . CORONARY ARTERY BYPASS GRAFT  2004  . left hip replacement  2007  . PARTIAL COLECTOMY  2004   sigmoid resection  . REFRACTIVE SURGERY  2013   had a film over eye removed.  . right hip replacement  2008     Current Meds  Medication Sig  . AMBULATORY NON FORMULARY MEDICATION Lift Chair DX:g30.1/f02.80  . aspirin (ADULT ASPIRIN EC LOW STRENGTH) 81 MG EC tablet Take 81 mg by mouth daily.    . Cholecalciferol (VITAMIN D3) 2000 units TABS Take 2,000 Units by mouth 2 (two) times daily.  Marland Kitchen ezetimibe (ZETIA) 10 MG tablet Take 1 tablet (10 mg total) by mouth daily. Please keep upcoming appt with Dr. Angelena Form in May for future refills. Thank you  . gabapentin (NEURONTIN) 100 MG capsule TAKE 1 CAPSULE AT BEDTIME.  . magnesium 30 MG tablet Take 30 mg by mouth at bedtime.  . metoprolol succinate (TOPROL-XL) 50 MG 24 hr tablet Take 1 tablet (50 mg total) by mouth daily. Please keep upcoming appt with Dr. Angelena Form in May for future refills. Thank you  .  Multiple Vitamins-Minerals (ALIVE WOMENS 50+ PO) Take 1 tablet by mouth daily.  . rivastigmine (EXELON) 9.5 mg/24hr Place 1 patch (9.5 mg total) onto the skin daily.  . rosuvastatin (CRESTOR) 40 MG tablet Take 1 tablet (40 mg total) by mouth every evening.  . sitaGLIPtin (JANUVIA) 50 MG tablet Take 1 tablet (50 mg total) by mouth daily.  Marland Kitchen telmisartan (MICARDIS) 20 MG tablet Take 1 tablet (20 mg total) by mouth daily. Please keep upcoming appt with Dr. Angelena Form in May for future refills. Thank you  . TRUE METRIX BLOOD GLUCOSE TEST test strip CHECK BLOOD SUGAR ONCE OR TWICE A DAY OR AS DIRECTED BY MD.  .  vitamin C (ASCORBIC ACID) 500 MG tablet Take 500 mg by mouth daily.  . [DISCONTINUED] OLANZapine (ZYPREXA) 2.5 MG tablet Take 0.5 tablets (1.25 mg total) by mouth at bedtime.     Allergies:   Aricept [donepezil hcl]   Social History   Tobacco Use  . Smoking status: Never Smoker  . Smokeless tobacco: Never Used  Substance Use Topics  . Alcohol use: No  . Drug use: No     Family Hx: The patient's family history includes Cancer in her mother; Cholelithiasis in her mother; Diabetes in her sister; Heart attack in her father and sister; Heart disease in her father; Other in her brother. There is no history of Colon cancer.  ROS:   Please see the history of present illness.    All other systems reviewed and are negative.   Prior CV studies:   The following studies were reviewed today:   Labs/Other Tests and Data Reviewed:    EKG:  No ECG reviewed.  Recent Labs: No results found for requested labs within last 8760 hours.   Recent Lipid Panel Lab Results  Component Value Date/Time   CHOL 114 10/03/2015 11:04 AM   TRIG 142.0 10/03/2015 11:04 AM   HDL 44.80 10/03/2015 11:04 AM   CHOLHDL 3 10/03/2015 11:04 AM   LDLCALC 41 10/03/2015 11:04 AM   LDLDIRECT 89.5 08/06/2007 09:51 AM    Wt Readings from Last 3 Encounters:  07/30/18 136 lb (61.7 kg)  03/30/18 136 lb (61.7 kg)  12/08/17 136 lb (61.7 kg)     Objective:    Vital Signs:  Ht 5\' 1"  (1.549 m)   Wt 136 lb (61.7 kg)   BMI 25.70 kg/m    No exam Phone visit  ASSESSMENT & PLAN:    1. CAD s/p CABG without angina: No chest pain. Lexiscan stress myoview January 2016 with no ischemia. Continue ASA, statin and Toprol.   2. CAROTID ARTERY DISEASE: She has moderate bilateral carotid artery disease, stable by dopplers June 2018. Will not repeat due to advanced age.   3. HTN: BP cannot be checked at home  4. HLD: Followed in primary care. Will continue statin.   COVID-19 Education: The signs and symptoms of  COVID-19 were discussed with the patient and how to seek care for testing (follow up with PCP or arrange E-visit).  The importance of social distancing was discussed today.  Time:   Today, I have spent 15 minutes with the patient with telehealth technology discussing the above problems.     Medication Adjustments/Labs and Tests Ordered: Current medicines are reviewed at length with the patient today.  Concerns regarding medicines are outlined above.   Tests Ordered: No orders of the defined types were placed in this encounter.   Medication Changes: No orders of the defined types were placed  in this encounter.   Disposition:  Follow up in 6 month(s)  Signed, Lauree Chandler, MD  07/30/2018 2:28 PM    [place]

## 2018-08-02 NOTE — Progress Notes (Signed)
Virtual Visit via Telephone Note The purpose of this virtual visit is to provide medical care while limiting exposure to the novel coronavirus.    Consent was obtained for phone visit:  Yes.   Answered questions that patient had about telehealth interaction:  Yes.   I discussed the limitations, risks, security and privacy concerns of performing an evaluation and management service by telephone. I also discussed with the patient that there may be a patient responsible charge related to this service. The patient expressed understanding and agreed to proceed.  Pt location: Home Physician Location: office Name of referring provider:  Burnis Medin, MD I connected with .Audrey [name] at patients initiation/request on 08/03/2018 at  9:30 AM EDT by telephone and verified that I am speaking with the correct person using two identifiers.  Pt MRN:  [id number] Pt DOB:  11-04-1926 Telephone participants:  Audrey Quarry Pastrana;  son Liliane Channel)  History of Present Illness:  Patient seen today in follow-up for dementia with prominent hallucinations.  After 6 weeks on nuplazid, no benefit was noted.  Zyprexa was started.  That made her too sleepy.  Recommendation was made to follow-up with psychiatry and I recommended a referral to Dr. Clovis Pu.  They did not follow through with that.  She is still on the Exelon patch, 9.5 mg daily.  She isn't sure that it is helping the hallucinations. She is on a very low-dose of gabapentin, 100 mg nightly, for restless leg which has seemed to help in the past but she thinks that tonic water helps more than the gabapentin.   Records have been reviewed.  Patient saw cardiology on May 8 through telemedicine visit.  No changes were made.   Observations/Objective:   There were no vitals filed for this visit. [facility] BLIND was 9/22.  Assessment and Plan:   1.  Dementia with hallucination, likely DLB                       -diarrhea resolved with d/c aricept.               -Have tried many  things for the hallucinations.  Quetiapine and Zyprexa caused side effects.  Nuplazid was not effective, nor has the exelon patch been.  Decided not to discontinue the Exelon patch yet, as am changing other things today.  Recommended referral to psychiatry.  They declined previously but are willing now.  They want me to send the referral, but will wait until things open up and they can go in person.  2.  Peripheral neuropathy             -The patient has clinical examination evidence of a diffuse peripheral neuropathy, which certainly can affect gait and balance.  We discussed safety associated with peripheral neuropathy.  We discussed balance therapy and the importance of ambulatory assistive device for balance assistance.  Family is noticing some more balance issues, but I think some of this is apraxia and difficulty using the walker because of this as well as because of vision issues.  I am not sure physical therapy will be helpful because of trouble learning.  She also has refused it.               -has IgA gammopathy.  Under surveillance with Dr. Alvy Bimler  3.  RLS             -we decided to hold the gabapentin, 100 mg , for a week.  They will then email me and let me know how the restless leg was without the medication.  They will let me know if they state off of the gabapentin or went back on it.  The patient thinks that her tonic water is really helping as much as the gabapentin.  If we can minimize medication, that would be great.  Fortunately, the gabapentin has not picked up more hallucinations.    Follow Up Instructions:    -I discussed the assessment and treatment plan with the patient. The patient was provided an opportunity to ask questions and all were answered. The patient agreed with the plan and demonstrated an understanding of the instructions.   The patient was advised to call back or seek an in-person evaluation if the symptoms worsen or if the condition fails to improve as  anticipated.    Total Time spent in visit with the patient was:  12 min (not including MOCA time), of which 100% of the time was spent in counseling and/or coordinating care on safety).   Pt understands and agrees with the plan of care outlined.     Alonza Bogus, DO

## 2018-08-03 ENCOUNTER — Other Ambulatory Visit: Payer: Self-pay

## 2018-08-03 ENCOUNTER — Encounter: Payer: Self-pay | Admitting: Neurology

## 2018-08-03 ENCOUNTER — Telehealth (INDEPENDENT_AMBULATORY_CARE_PROVIDER_SITE_OTHER): Payer: Medicare Other | Admitting: Neurology

## 2018-08-03 ENCOUNTER — Encounter (INDEPENDENT_AMBULATORY_CARE_PROVIDER_SITE_OTHER): Payer: Medicare Other | Admitting: Neurology

## 2018-08-03 ENCOUNTER — Telehealth: Payer: Self-pay | Admitting: *Deleted

## 2018-08-03 DIAGNOSIS — G9009 Other idiopathic peripheral autonomic neuropathy: Secondary | ICD-10-CM

## 2018-08-03 DIAGNOSIS — G2581 Restless legs syndrome: Secondary | ICD-10-CM

## 2018-08-03 DIAGNOSIS — F0391 Unspecified dementia with behavioral disturbance: Secondary | ICD-10-CM | POA: Diagnosis not present

## 2018-08-03 DIAGNOSIS — R442 Other hallucinations: Secondary | ICD-10-CM

## 2018-08-03 NOTE — Telephone Encounter (Signed)
-----   Message from [place], DO sent at 08/03/2018  9:34 AM EDT ----- Please send a referral to Dr. Lynder Parents for the Diagnosis of hallucinations associated with dementia with lewy body

## 2018-08-03 NOTE — Telephone Encounter (Signed)
Referral sent to Dr. Clovis Pu.

## 2018-08-23 ENCOUNTER — Ambulatory Visit: Payer: Medicare Other | Admitting: Neurology

## 2018-08-29 ENCOUNTER — Other Ambulatory Visit: Payer: Self-pay | Admitting: Neurology

## 2018-08-30 NOTE — Telephone Encounter (Signed)
Requested Prescriptions   Pending Prescriptions Disp Refills  . gabapentin (NEURONTIN) 100 MG capsule [Pharmacy Med Name: GABAPENTIN 100 MG CAPSULE] 30 capsule 0    Sig: TAKE 1 CAPSULE AT BEDTIME.   Rx last filled:07/25/18 #30 0 refills  Pt last seen:08/03/18  Follow up appt scheduled:none

## 2018-09-07 ENCOUNTER — Other Ambulatory Visit: Payer: Self-pay | Admitting: Cardiovascular Disease

## 2018-09-07 MED ORDER — ROSUVASTATIN CALCIUM 40 MG PO TABS: 40.0000 mg | ORAL_TABLET | Freq: Every evening | ORAL | 3 refills | Status: DC

## 2018-09-07 MED ORDER — EZETIMIBE 10 MG PO TABS: 10.0000 mg | ORAL_TABLET | Freq: Every day | ORAL | 3 refills | Status: DC

## 2018-09-07 MED ORDER — TELMISARTAN 20 MG PO TABS: 20.0000 mg | ORAL_TABLET | Freq: Every day | ORAL | 3 refills | Status: DC

## 2018-09-07 MED ORDER — METOPROLOL SUCCINATE ER 50 MG PO TB24: 50.0000 mg | ORAL_TABLET | Freq: Every day | ORAL | 3 refills | Status: DC

## 2018-09-07 NOTE — Telephone Encounter (Signed)
Pt's medications were sent to pt's pharmacy as requested. Confirmation received.  

## 2018-09-10 ENCOUNTER — Telehealth: Payer: Self-pay | Admitting: Cardiovascular Disease

## 2018-09-10 NOTE — Telephone Encounter (Signed)
No message needed °

## 2018-09-22 ENCOUNTER — Other Ambulatory Visit: Payer: Self-pay

## 2018-09-22 DIAGNOSIS — D472 Monoclonal gammopathy: Secondary | ICD-10-CM

## 2018-09-23 ENCOUNTER — Inpatient Hospital Stay: Payer: Medicare Other | Attending: Hematology and Oncology

## 2018-09-27 ENCOUNTER — Other Ambulatory Visit: Payer: Self-pay | Admitting: Neurology

## 2018-09-27 NOTE — Telephone Encounter (Signed)
Requested Prescriptions   Pending Prescriptions Disp Refills  . gabapentin (NEURONTIN) 100 MG capsule [Pharmacy Med Name: GABAPENTIN 100 MG CAPSULE] 30 capsule 0    Sig: TAKE 1 CAPSULE AT BEDTIME.   Rx last filled:08/30/18 #30 0 REFILLS  Pt last seen:08/03/18  Follow up appt scheduled:NONE

## 2018-09-28 ENCOUNTER — Ambulatory Visit: Payer: Medicare Other | Admitting: Internal Medicine

## 2018-09-29 ENCOUNTER — Other Ambulatory Visit: Payer: Self-pay | Admitting: Internal Medicine

## 2018-09-30 ENCOUNTER — Inpatient Hospital Stay: Payer: Medicare Other | Admitting: Hematology and Oncology

## 2018-10-22 NOTE — Progress Notes (Signed)
[place] TEST PERFORMED BY DANA CHAMERLAIN ON 08/03/18

## 2018-10-28 ENCOUNTER — Telehealth: Payer: Self-pay | Admitting: Neurology

## 2018-10-28 ENCOUNTER — Other Ambulatory Visit: Payer: Self-pay | Admitting: Neurology

## 2018-10-28 MED ORDER — GABAPENTIN 100 MG PO CAPS: 100.0000 mg | ORAL_CAPSULE | Freq: Every day | ORAL | 0 refills | Status: DC

## 2018-10-28 NOTE — Telephone Encounter (Signed)
Requested Prescriptions   Pending Prescriptions Disp Refills  . gabapentin (NEURONTIN) 100 MG capsule [Pharmacy Med Name: GABAPENTIN 100 MG CAPSULE] 30 capsule 0    Sig: TAKE 1 CAPSULE AT BEDTIME.   Rx last filled: 09/27/18 #30 refills  Pt last seen:08/03/18  3. RLS -we decided to hold the gabapentin, 100 mg , for a week.  They will then email me and let me know how the restless leg was without the medication.  They will let me know if they state off of the gabapentin or went back on it.  The patient thinks that her tonic water is really helping as much as the gabapentin.  If we can minimize medication, that would be great.  Fortunately, the gabapentin has not picked up more hallucinations.    Follow up appt scheduled:none  ~called spoke with patient regarding her restless legs and if she was still taken medication. She states she is not taken medication and is doing ok without it. Will cancel Rx refill request.

## 2018-10-28 NOTE — Telephone Encounter (Signed)
Patient son needs a refill on the gabapentin called into the [facility]

## 2018-10-28 NOTE — Telephone Encounter (Signed)
There is some confusion with medication management from last office visit back in May Review last office note with son Liliane Channel he states that patient is still on gabapentin. She has Stop Exelon patch for about a week now.  Requesting refill for gabapentin to be sent to pharmacy. Rx sent.  See refill request note regarding this medication

## 2018-11-01 ENCOUNTER — Other Ambulatory Visit: Payer: Self-pay

## 2018-11-01 ENCOUNTER — Telehealth: Payer: Self-pay | Admitting: *Deleted

## 2018-11-01 ENCOUNTER — Other Ambulatory Visit: Payer: Self-pay | Admitting: Neurology

## 2018-11-01 ENCOUNTER — Telehealth: Payer: Self-pay | Admitting: Internal Medicine

## 2018-11-01 ENCOUNTER — Telehealth (INDEPENDENT_AMBULATORY_CARE_PROVIDER_SITE_OTHER): Payer: Medicare Other | Admitting: Family Medicine

## 2018-11-01 ENCOUNTER — Encounter: Payer: Self-pay | Admitting: Family Medicine

## 2018-11-01 DIAGNOSIS — I6523 Occlusion and stenosis of bilateral carotid arteries: Secondary | ICD-10-CM

## 2018-11-01 DIAGNOSIS — R300 Dysuria: Secondary | ICD-10-CM

## 2018-11-01 DIAGNOSIS — N390 Urinary tract infection, site not specified: Secondary | ICD-10-CM | POA: Diagnosis not present

## 2018-11-01 LAB — URINALYSIS, ROUTINE W REFLEX MICROSCOPIC
Bilirubin Urine: NEGATIVE
Ketones, ur: NEGATIVE
Nitrite: NEGATIVE
Specific Gravity, Urine: 1.01 (ref 1.000–1.030)
Urine Glucose: NEGATIVE
Urobilinogen, UA: 0.2 (ref 0.0–1.0)
pH: 6.5 (ref 5.0–8.0)

## 2018-11-01 MED ORDER — SITAGLIPTIN PHOSPHATE 50 MG PO TABS: 50.0000 mg | ORAL_TABLET | Freq: Every day | ORAL | 4 refills | Status: DC

## 2018-11-01 MED ORDER — RIVASTIGMINE 9.5 MG/24HR TD PT24: 9.5000 mg | MEDICATED_PATCH | Freq: Every day | TRANSDERMAL | 1 refills | Status: DC

## 2018-11-01 MED ORDER — SULFAMETHOXAZOLE-TRIMETHOPRIM 800-160 MG PO TABS: 1.0000 | ORAL_TABLET | Freq: Every day | ORAL | 0 refills | Status: AC

## 2018-11-01 NOTE — Telephone Encounter (Signed)
Pt had appt with Dr.Jordan today

## 2018-11-01 NOTE — Telephone Encounter (Signed)
Pt has been off of Exelon patches for about 2 weeks not patient has increase hallucinations. Son requesting to start back on patches and refill to [facility].   Pt called with c/o:  confusion/hallucinations New medications?  No. When did they start?  progressively getting worst over the last week If hallucinations are new, has patient been checked for infection, including UTI?  No. Pt son has called PCP waiting on call back for appt or to get urine sample for lab. Pt also c/o of dysuria, itching Current medications prescribed by Dr. Carles Collet and TIMES taking the medications: GABAPENTIN 100 MG at bedtime, Exelon 9.5 mg patches

## 2018-11-01 NOTE — Telephone Encounter (Signed)
The urine may be the issue for the hallucinations but okay to fill the exelon patches.  Also what happened with referral to psychiatry/Dr. Clovis Pu?

## 2018-11-01 NOTE — Telephone Encounter (Signed)
Dr. Clovis Pu office called to schedule appt and they decline. They both felt like at the time she didn't need it.  Refill sent

## 2018-11-01 NOTE — Telephone Encounter (Signed)
Refill sent in

## 2018-11-01 NOTE — Telephone Encounter (Signed)
lvm for pt to call back.  

## 2018-11-01 NOTE — Telephone Encounter (Signed)
Patient's daughter in law calling to inquire if they can drop off a urine sample. She states that patient has been experiencing burning with urination and increased frequency. She declined appointment at this time.

## 2018-11-01 NOTE — Telephone Encounter (Signed)
See note

## 2018-11-01 NOTE — Telephone Encounter (Signed)
Dr. Martinique was running behind, but did connect with patient at 1 pm. Nothing further needed at this time.  Copied from [place] [id number]. Topic: Appointment Scheduling - Scheduling Inquiry for Clinic >> Nov 01, 2018 12:56 PM Audrey [name], Marland Kitchen wrote: Reason for CRM: pt had appt for 1200 today, it is now 6754 and she has not gotten her text yet.  Son took off work to help pt with her appt.  Tried calling the office 4 times, 3 no answer, and 1 time hung up on during hold.   Please call back to let the son know status  [phone]

## 2018-11-01 NOTE — Progress Notes (Signed)
Virtual Visit via Video Note   I connected with [name] on 11/01/18 by a video enabled telemedicine application and verified that I am speaking with the correct person using two identifiers.  Location patient: home Location provider:home office Persons participating in the virtual visit: patient, provider  I discussed the limitations of evaluation and management by telemedicine and the availability of in person appointments. The patient expressed understanding and agreed to proceed.   HPI: [name] is a 85 years old female who has history of PAD, hypertension, AAA,CKD IV, and MGUS who is complaining about 4 days of dysuria, cloudy and odorous urine. Increasing urine frequency, nocturia x3-4.  Her son helps with providing hx.  She has had UTIs in the past,years ago.  She denies chills, fever, changes in appetite, unusual fatigue, unusual back pain,or MS changes. Negative for abdominal pain, nausea, vomiting, gross hematuria, or decreased urine output.  Problem seems to be stable.  She has not tried OTC medications.  ROS: See pertinent positives and negatives per HPI.  Past Medical History:  Diagnosis Date  . AAA (abdominal aortic aneurysm) (HCC)    small  . Bilateral carotid artery stenosis   . CAD (coronary artery disease)    including LM disease; s/p CABG  . Colon cancer ([place])   . History of chickenpox   . History of colon cancer    s/p resection in 2004  . History of colonoscopy   . HTN (hypertension)   . Hyperlipidemia   . OA (osteoarthritis)    severe; s/p bilateral hip replacement    Past Surgical History:  Procedure Laterality Date  . CATARACT EXTRACTION, BILATERAL    . CORONARY ARTERY BYPASS GRAFT  2004  . left hip replacement  2007  . PARTIAL COLECTOMY  2004   sigmoid resection  . REFRACTIVE SURGERY  2013   had a film over eye removed.  . right hip replacement  2008    Family History  Problem Relation Age of Onset  . Cancer Mother    intra-abdominal/gallbladder  . Cholelithiasis Mother   . Heart attack Father        27  . Heart disease Father   . Diabetes Sister        12de  . Other Brother        GSW 34   . Heart attack Sister   . Colon cancer Neg Hx     Social History   Socioeconomic History  . Marital status: Widowed    Spouse name: Not on file  . Number of children: 3  . Years of education: Not on file  . Highest education level: Not on file  Occupational History  . 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 Financial resource strain: Not on file  . Food insecurity    Worry: Not on file    Inability: Not on file  . Transportation needs    Medical: Not on file    Non-medical: Not on file  Tobacco Use  . Smoking status: Never Smoker  . Smokeless tobacco: Never Used  Substance and Sexual Activity  . Alcohol use: No  . Drug use: No  . Sexual activity: Not on file  Lifestyle  . Physical activity    Days per week: Not on file    Minutes per session: Not on file  . Stress: Not on file  Relationships  . Social connections  Talks on phone: Not on file    Gets together: Not on file    Attends religious service: Not on file    Active member of club or organization: Not on file    Attends meetings of clubs or organizations: Not on file    Relationship status: Not on file  . Intimate partner violence    Fear of current or ex partner: Not on file    Emotionally abused: Not on file    Physically abused: Not on file    Forced sexual activity: Not on file  Other Topics Concern  . 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      g3p3  Children are well   Lives independently by herself no pets family daughter  lives close by 8 hours of sleep at least    Negative TAD   14 years of education      Hearing  aid and dentures   No falls .  Has smoke detector and wears seat belts. . No excess sun exposure. . No depression          Current Outpatient Medications:  .  AMBULATORY NON FORMULARY MEDICATION, Lift Chair DX:g30.1/f02.80, Disp: 1 Device, Rfl: 0 .  aspirin (ADULT ASPIRIN EC LOW STRENGTH) 81 MG EC tablet, Take 81 mg by mouth daily.  , Disp: , Rfl:  .  Cholecalciferol (VITAMIN D3) 2000 units TABS, Take 2,000 Units by mouth 2 (two) times daily., Disp: , Rfl:  .  ezetimibe (ZETIA) 10 MG tablet, Take 1 tablet (10 mg total) by mouth daily., Disp: 90 tablet, Rfl: 3 .  gabapentin (NEURONTIN) 100 MG capsule, Take 1 capsule (100 mg total) by mouth at bedtime., Disp: 90 capsule, Rfl: 0 .  magnesium 30 MG tablet, Take 30 mg by mouth at bedtime., Disp: , Rfl:  .  metoprolol succinate (TOPROL-XL) 50 MG 24 hr tablet, Take 1 tablet (50 mg total) by mouth daily., Disp: 90 tablet, Rfl: 3 .  Multiple Vitamins-Minerals (ALIVE WOMENS 50+ PO), Take 1 tablet by mouth daily., Disp: , Rfl:  .  rivastigmine (EXELON) 9.5 mg/24hr, Place 1 patch (9.5 mg total) onto the skin daily., Disp: 90 patch, Rfl: 1 .  rosuvastatin (CRESTOR) 40 MG tablet, Take 1 tablet (40 mg total) by mouth every evening., Disp: 90 tablet, Rfl: 3 .  sitaGLIPtin (JANUVIA) 50 MG tablet, Take 1 tablet (50 mg total) by mouth daily., Disp: 30 tablet, Rfl: 2 .  telmisartan (MICARDIS) 20 MG tablet, Take 1 tablet (20 mg total) by mouth daily., Disp: 90 tablet, Rfl: 3 .  TRUE METRIX BLOOD GLUCOSE TEST test strip, CHECK BLOOD SUGAR ONCE OR TWICE A DAY OR AS DIRECTED BY MD., Disp: 50 strip, Rfl: 0 .  vitamin C (ASCORBIC ACID) 500 MG tablet, Take 500 mg by mouth daily., Disp: , Rfl:   EXAM:  VITALS per patient if applicable:N/A ,she does not have BP monitor.  GENERAL: alert, oriented, appears well and in no acute distress  HEENT: atraumatic, conjunctiva clear, no obvious facial abnormalities on inspection.  LUNGS: on inspection no signs of respiratory  distress, breathing rate appears normal, no obvious gross SOB, gasping or wheezing  CV: no obvious cyanosis  PSYCH/NEURO: pleasant and cooperative, no obvious depression or anxiety, speech and thought processing grossly intact  ASSESSMENT AND PLAN:  Discussed the following assessment and plan:  Dysuria - Plan: Urinalysis, Routine w reflex microscopic, Culture, Urine, Culture, Urine, Urinalysis, Routine w reflex microscopic We discussed possible etiologies. We will follow UA and urine culture.  Urinary tract infection without hematuria, site unspecified - Plan: Empiric abx treatment started today, Bactrim DS once daily (adjusted based on renal function).  Treatment will be tailored according to Ucx results and susceptibility report.  Clearly instructed about warning signs. F/U with PCP if symptoms persist.  Her son is coming to the lab to pick lab sterile containers to collect urine. Commend starting antibiotics after urine sample is collected.    I discussed the assessment and treatment plan with the patient. She was provided an opportunity to ask questions and all were answered.She agreed with the plan and demonstrated an understanding of the instructions.   The patient was advised to call back or seek an in-person evaluation if the symptoms worsen or if the condition fails to improve as anticipated.  Follow-up as needed.  Cylinda Santoli Martinique, MD

## 2018-11-01 NOTE — Telephone Encounter (Signed)
Son is wanting to talk to you about the patches that she uses. He said he talked to you the other day and there was some confusion. Please call him back. Thanks!

## 2018-11-01 NOTE — Telephone Encounter (Signed)
Yes   Lets get UA  and urine culture   ( here or [facility] is ok   Advise relex micro if done at [facility])   and then have a telephone or video visit

## 2018-11-01 NOTE — Telephone Encounter (Signed)
Patient needs a refill on her   sitaGLIPtin (JANUVIA) 50 MG tablet      Sent to:    Pharmacy:  [facility] [place] [place] - [address]

## 2018-11-03 LAB — URINE CULTURE
MICRO NUMBER:: 755171
SPECIMEN QUALITY:: ADEQUATE

## 2018-11-08 ENCOUNTER — Telehealth: Payer: Self-pay

## 2018-11-08 ENCOUNTER — Telehealth: Payer: Self-pay | Admitting: Neurology

## 2018-11-08 NOTE — Telephone Encounter (Signed)
Patient's daughter in law is calling in about trying to find a Hospital bed for patient. She said if she isn't on file to talk to then please call her husband and leave him a VM as he will be in meetings all day. Thanks!

## 2018-11-08 NOTE — Telephone Encounter (Signed)
i'm not sure that she will meet criteria for hospital bed via medicare.  They usually require that pts are in the bed the majority of the day (like 15+ hours I think) and not sure that her diagnosis will pay for it either

## 2018-11-08 NOTE — Telephone Encounter (Signed)
Copied from [place] [phone]. Topic: General - Other >> Nov 08, 2018 10:43 AM Sheran Luz wrote: Patient's daughter in law calling to request a hospital bed for patient. She is requesting a call back from [facility]

## 2018-11-08 NOTE — Telephone Encounter (Signed)
Called spoke with spouse he was informed of provider response aware and understands. He will contact PCP to see if the can get her approved for hospital bed

## 2018-11-10 NOTE — Telephone Encounter (Signed)
Do not know if this will be covered . Ok to   [employer] in request for hospital bed. But please  Please ascertain from family  Diagnosis/ reason  hospital bed.

## 2018-11-12 NOTE — Telephone Encounter (Signed)
Lvm for pt to call back to find out why hospital bed is needed for reason and that we are not sure it will be covered. Okay to disclose

## 2018-11-22 ENCOUNTER — Telehealth: Payer: Self-pay | Admitting: Internal Medicine

## 2018-11-22 MED ORDER — CEFDINIR 300 MG PO CAPS: 300.0000 mg | ORAL_CAPSULE | Freq: Two times a day (BID) | ORAL | 0 refills | Status: DC

## 2018-11-22 NOTE — Telephone Encounter (Signed)
Please advise 

## 2018-11-22 NOTE — Telephone Encounter (Signed)
See request °

## 2018-11-22 NOTE — Telephone Encounter (Signed)
FYI  Spoke to pt son Audrey [name] and he stated that the pt was not feeling better over the weekend. They made the call to [place] to see if pt needed another round of antibiotics. After speaking to [place] today he stated that the pt has gotten a lot better but unsure of pt current sx. I advise rick to check pt temp and to call us back with pt current sx. Audrey [name] stated he is at work but will call us after checking with pt.

## 2018-11-22 NOTE — Telephone Encounter (Signed)
Pt was seen by Dr. Martinique for a UTI and was prescribed an antibiotic but the UTI has come back and Pt needs another round of medication/ please advise

## 2018-11-22 NOTE — Telephone Encounter (Signed)
If no fever  Systemic symptoms ( please make sure )  And that she never got better  Please send in  omnicef 300  1 po bid for 7 days  And if not better will need another viist virutal and repeat urine etc

## 2019-01-23 ENCOUNTER — Other Ambulatory Visit: Payer: Self-pay | Admitting: Neurology

## 2019-01-26 ENCOUNTER — Other Ambulatory Visit: Payer: Self-pay

## 2019-01-26 ENCOUNTER — Telehealth: Payer: Self-pay | Admitting: Neurology

## 2019-01-26 MED ORDER — GABAPENTIN 100 MG PO CAPS: 100.0000 mg | ORAL_CAPSULE | Freq: Every day | ORAL | 0 refills | Status: DC

## 2019-01-26 NOTE — Telephone Encounter (Signed)
Sent for 90 days no refill patient needs appointment

## 2019-01-26 NOTE — Telephone Encounter (Signed)
Son is calling in about the gabapentin refill to the pharm on file. Usually does 30 days supply and he was wanting to do more if he can. He has already spoken with the pharm. Thanks!

## 2019-02-07 ENCOUNTER — Other Ambulatory Visit: Payer: Self-pay | Admitting: Neurology

## 2019-02-07 ENCOUNTER — Telehealth: Payer: Self-pay | Admitting: Neurology

## 2019-02-07 ENCOUNTER — Other Ambulatory Visit: Payer: Self-pay

## 2019-02-07 MED ORDER — RIVASTIGMINE 9.5 MG/24HR TD PT24: 9.5000 mg | MEDICATED_PATCH | Freq: Every day | TRANSDERMAL | 0 refills | Status: DC

## 2019-02-07 NOTE — Telephone Encounter (Signed)
Okay to send 90 day refill to the [place] and 15 days of exelon patch to [place]

## 2019-02-07 NOTE — Telephone Encounter (Signed)
How would you like me to split the medication since her son states she needs it as soon as possible

## 2019-02-07 NOTE — Telephone Encounter (Signed)
Patient's son Rick [phone] called regarding Kimberlyann's "Patch" Medication. She is needing a refill sent to the [place]. He is requesting a couple be sen to [facility] until she can get the remaining from the [place]. She was seen last in 10/2018 and scheduled for 04/2019. Please Call. Thank you

## 2019-02-07 NOTE — Telephone Encounter (Signed)
Done, son on dpr and left message for him about medications

## 2019-02-14 ENCOUNTER — Other Ambulatory Visit: Payer: Self-pay

## 2019-02-22 ENCOUNTER — Other Ambulatory Visit: Payer: Self-pay | Admitting: Internal Medicine

## 2019-03-07 ENCOUNTER — Other Ambulatory Visit: Payer: Self-pay

## 2019-03-07 ENCOUNTER — Telehealth: Payer: Self-pay | Admitting: Neurology

## 2019-03-07 MED ORDER — RIVASTIGMINE 13.3 MG/24HR TD PT24: 13.3000 mg | MEDICATED_PATCH | Freq: Every day | TRANSDERMAL | 0 refills | Status: DC

## 2019-03-07 NOTE — Telephone Encounter (Signed)
Left message to call office back

## 2019-03-07 NOTE — Telephone Encounter (Signed)
Per my last note in May: Have tried many things for the hallucinations.  Quetiapine and Zyprexa caused side effects.  Nuplazid was not effective, nor has the exelon patch been.  Decided not to discontinue the Exelon patch yet, as am changing other things today.  Recommended referral to psychiatry.  They declined previously but are willing now.   Not sure that the psychiatry referral got sent as they wanted to wait until things opened up and they could go in person.  Find out if still willing as I don't know what else to offer.  If willing, call Dr. Casimiro Needle office and see when she can be seen by a physician

## 2019-03-07 NOTE — Telephone Encounter (Signed)
You can give her RX for the 13.3 mg exelon patch but that is not going to solve this issue I am not providing sleeping pill as can make her confusion worse.  Have tried several without success and others are more addictive and don't feel comfortable.  This is another reason I think psychiatry would be good.  Have discussed with them many times.

## 2019-03-07 NOTE — Telephone Encounter (Signed)
Patient is on the rivastigmine patch for dementia and she is currently seeing thousands of people a day that are not there. Is there a way you can increase the dosage on the patch to help with the hallucinations. She is also not sleeping. They are wanting something to help her sleep. Thanks!

## 2019-03-07 NOTE — Telephone Encounter (Signed)
Rick returned call to office. Thank you

## 2019-03-07 NOTE — Telephone Encounter (Signed)
Audrey [name] is aware and is calling the PCP. He did want the higher dosage sent to the [place]

## 2019-03-07 NOTE — Telephone Encounter (Signed)
Liliane Channel would like the patch mg increased,and he would like a sleeping aide to be sent. Please advise

## 2019-03-15 ENCOUNTER — Other Ambulatory Visit: Payer: Self-pay

## 2019-03-15 ENCOUNTER — Telehealth: Payer: Self-pay | Admitting: Internal Medicine

## 2019-03-15 MED ORDER — SITAGLIPTIN PHOSPHATE 50 MG PO TABS: 50.0000 mg | ORAL_TABLET | Freq: Every day | ORAL | 4 refills | Status: DC

## 2019-03-15 NOTE — Telephone Encounter (Signed)
Medication Refill - Medication: sitaGLIPtin (JANUVIA) 50 MG tablet    Has the patient contacted their pharmacy? No. (Agent: If no, request that the patient contact the pharmacy for the refill.) (Agent: If yes, when and what did the pharmacy advise?)  Preferred Pharmacy (with phone number or street name):  [facility] [address] Phone:  [phone]  Fax:  [phone]      Agent: Please be advised that RX refills may take up to 3 business days. We ask that you follow-up with your pharmacy.

## 2019-03-15 NOTE — Telephone Encounter (Signed)
Medication sent in. 

## 2019-03-15 NOTE — Telephone Encounter (Signed)
Message Routed to PCP CMA 

## 2019-03-21 ENCOUNTER — Telehealth: Payer: Self-pay | Admitting: Neurology

## 2019-03-21 NOTE — Telephone Encounter (Signed)
Patient's son called requesting a call back. He is requesting a recommendation for hospice care and evaluation.

## 2019-03-21 NOTE — Telephone Encounter (Signed)
Please contact patient back and let them know, Dr. Carles Collet will not be here till Thursday, thanks for any recommendations to call Thursday thanks

## 2019-03-21 NOTE — Telephone Encounter (Signed)
Patient's son called again to check on the status of the request.

## 2019-03-22 ENCOUNTER — Ambulatory Visit: Payer: Self-pay | Admitting: *Deleted

## 2019-03-22 ENCOUNTER — Other Ambulatory Visit: Payer: Self-pay

## 2019-03-22 DIAGNOSIS — N184 Chronic kidney disease, stage 4 (severe): Secondary | ICD-10-CM

## 2019-03-22 DIAGNOSIS — F0280 Dementia in other diseases classified elsewhere without behavioral disturbance: Secondary | ICD-10-CM

## 2019-03-22 NOTE — Telephone Encounter (Signed)
Spoke with son.  About 2 weeks ago pt was not able to walk. Since then she has been confined to the bed. She does not eat or drink much. Does not speak clearly. Pt does not perform ADL's. There are two sitters that come in and help.  Son would like for Hospice to come in to the home. He does not want to take pt to the ER.  He is also contacting pts PCP.

## 2019-03-22 NOTE — Telephone Encounter (Signed)
Ok to do a hospice referral for them .   ( If need more information then do a virtual or phone visit about this )

## 2019-03-22 NOTE — Telephone Encounter (Signed)
Incoming call from Patient son requesting a referral referral from Dr.  Regis Bill for hopice.  Son states that Patient has become bedfast.  Unable to ambulate.  Patient son can be reached at [phone].

## 2019-03-22 NOTE — Telephone Encounter (Signed)
Referral faxed to [facility] ( at sons request). He did not want to have labs/UA performed because pt would be unable to give sample or do the labwork.   [phone]

## 2019-03-22 NOTE — Telephone Encounter (Signed)
Ok to send referral to hospice. With sudden changes, there may be an ongoing infection, can try visiting nurse to do bloodwork and try urinalysis if son is agreeable?

## 2019-03-22 NOTE — Telephone Encounter (Signed)
Referral was already placed by another doctor

## 2019-03-22 NOTE — Telephone Encounter (Signed)
Patient's son called again and said his mom is in "dire straits." Routing call to Ashley--said she would notify the doctor on call yesterday.

## 2019-03-22 NOTE — Telephone Encounter (Signed)
Please advise 

## 2019-03-28 ENCOUNTER — Other Ambulatory Visit: Payer: Self-pay | Admitting: Neurology

## 2019-03-28 DIAGNOSIS — E118 Type 2 diabetes mellitus with unspecified complications: Secondary | ICD-10-CM | POA: Diagnosis not present

## 2019-03-28 DIAGNOSIS — M545 Low back pain: Secondary | ICD-10-CM | POA: Diagnosis not present

## 2019-03-28 DIAGNOSIS — Z0000 Encounter for general adult medical examination without abnormal findings: Secondary | ICD-10-CM | POA: Diagnosis not present

## 2019-03-28 DIAGNOSIS — Z7189 Other specified counseling: Secondary | ICD-10-CM | POA: Diagnosis not present

## 2019-03-28 DIAGNOSIS — N184 Chronic kidney disease, stage 4 (severe): Secondary | ICD-10-CM | POA: Diagnosis not present

## 2019-03-28 DIAGNOSIS — I10 Essential (primary) hypertension: Secondary | ICD-10-CM | POA: Diagnosis not present

## 2019-04-08 ENCOUNTER — Other Ambulatory Visit: Payer: Self-pay | Admitting: Neurology

## 2019-04-08 ENCOUNTER — Other Ambulatory Visit: Payer: Self-pay

## 2019-04-08 MED ORDER — RIVASTIGMINE 13.3 MG/24HR TD PT24: 13.3000 mg | MEDICATED_PATCH | Freq: Every day | TRANSDERMAL | 3 refills | Status: DC

## 2019-04-08 NOTE — Telephone Encounter (Signed)
Exelon patches refilled. Sent to [facility].

## 2019-04-08 NOTE — Telephone Encounter (Signed)
Son is asking about the patches that patient takes with the new increased dose of the 13 mg and he was wanting to know if a refill can be sent to the [facility] like last time- she had just gotten like a 30 days supply last time to try the new dose. Thanks!

## 2019-04-12 ENCOUNTER — Other Ambulatory Visit: Payer: Self-pay | Admitting: Internal Medicine

## 2019-04-22 ENCOUNTER — Encounter: Payer: Self-pay | Admitting: Neurology

## 2019-04-22 NOTE — Progress Notes (Signed)
Virtual Visit via Video Note The purpose of this virtual visit is to provide medical care while limiting exposure to the novel coronavirus.    Consent was obtained for video visit:  Yes.   Answered questions that patient had about telehealth interaction:  Yes.   I discussed the limitations, risks, security and privacy concerns of performing an evaluation and management service by telemedicine. I also discussed with the patient that there may be a patient responsible charge related to this service. The patient expressed understanding and agreed to proceed.  Pt location: Home Physician Location: home Name of referring provider:  Burnis Medin, MD I connected with Audrey [name] at patients initiation/request on 04/25/2019 at  1:30 PM EST by video enabled telemedicine application and verified that I am speaking with the correct person using two identifiers. Pt MRN:  [id number] Pt DOB:  1926/07/20 Video Participants:  Audrey [name]; son and daughter-in-law supplement the history    History of Present Illness:  Patient seen today in follow-up for dementia with Lewy bodies.  We have tried multiple medications for very vivid hallucinations.  Medications have mostly had side effects.  She is on the Exelon patch.  We increased it, and family states that the increase did help.  However, she ran out of the medication and "all of the people returned."  She was out for several weeks and while she was out of the medication, they used the lower dose of the patch that they had at home.  When she got the refills of the 13.3 mg, she continued to have hallucinations.  However, she has only been back on it for about 5 days.  Last visit, we decided on a referral to Dr. Clovis Pu but they did not want to go during the pandemic.  She has become more debilitated.  She was completely bedbound.  She had an episode where her blood sugar had gotten very low and really has not been able to walk since then.  Over the last few  weeks, her appetite has returned.  She just started being able to feed herself.  She was having difficulty sleeping and hospice increased her gabapentin.  They do not think that increased her hallucinations.  Current movement d/o meds:  Exelon patch, 13.3 mg    Current Outpatient Medications on File Prior to Visit  Medication Sig Dispense Refill  . AMBULATORY NON FORMULARY MEDICATION Lift Chair DX:g30.1/f02.80 1 Device 0  . aspirin (ADULT ASPIRIN EC LOW STRENGTH) 81 MG EC tablet Take 81 mg by mouth daily.      . cefdinir (OMNICEF) 300 MG capsule Take 1 capsule (300 mg total) by mouth 2 (two) times daily. For  uti 14 capsule 0  . Cholecalciferol (VITAMIN D3) 2000 units TABS Take 2,000 Units by mouth 2 (two) times daily.    Marland Kitchen ezetimibe (ZETIA) 10 MG tablet Take 1 tablet (10 mg total) by mouth daily. 90 tablet 3  . gabapentin (NEURONTIN) 100 MG capsule TAKE 1 CAPSULE AT BEDTIME. 90 capsule 0  . magnesium 30 MG tablet Take 30 mg by mouth at bedtime.    . metoprolol succinate (TOPROL-XL) 50 MG 24 hr tablet Take 1 tablet (50 mg total) by mouth daily. 90 tablet 3  . Multiple Vitamins-Minerals (ALIVE WOMENS 50+ PO) Take 1 tablet by mouth daily.    . rivastigmine (EXELON) 13.3 MG/24HR Place 1 patch (13.3 mg total) onto the skin daily. 30 patch 3  . rosuvastatin (CRESTOR) 40 MG tablet Take  1 tablet (40 mg total) by mouth every evening. 90 tablet 3  . sitaGLIPtin (JANUVIA) 50 MG tablet Take 1 tablet (50 mg total) by mouth daily. 30 tablet 4  . telmisartan (MICARDIS) 20 MG tablet Take 1 tablet (20 mg total) by mouth daily. 90 tablet 3  . TRUE METRIX BLOOD GLUCOSE TEST test strip CHECK BLOOD SUGAR ONCE OR TWICE A DAY OR AS DIRECTED BY MD. 100 strip 1  . vitamin C (ASCORBIC ACID) 500 MG tablet Take 500 mg by mouth daily.     No current facility-administered medications on file prior to visit.     Observations/Objective:   There were no vitals filed for this visit. GEN:  The patient appears stated  age and is in NAD.  Neurological examination:  Orientation: The patient is alert and oriented x3. Cranial nerves: There is good facial symmetry. There is no significant facial hypomimia.  The speech is fluent and clear. Soft palate rises symmetrically and there is no tongue deviation. Hearing is intact to conversational tone. Motor: Strength is at least antigravity x 4.   Shoulder shrug is equal and symmetric.  There is no pronator drift.  Movement examination: Tone: unable Abnormal movements: None Coordination: She has no trouble with finger-to-nose bilaterally. Gait and Station: Patient is no longer able to ambulate per family.    Assessment and Plan:   1.  DLB  -have been through many meds without success.  Aricept caused diarrhea.  Quetiapine and Zyprexa have had side effects.  Nuplazid was not effective.  According to family, patient was doing well on Exelon patch, 13.3 mg, but when she ran out of it she went back to the lower dose and had hallucinations.  She is just back on the 13.3 mg for about 5 days.  I told them to be patient.  If she continued to have hallucinations over the next month or 2, then I told them they really needed to seek consultation with Dr. Clovis Pu.  I told them that they would probably be able to do that online.  They expressed understanding. 2.  Gait trouble  -Multifactorial, due to peripheral neuropathy as well as apraxia of gait.  Patient no longer ambulating. 3.  Insomnia  -Hospice is treating that with gabapentin, 300 mg at night.  They do not think that increased hallucinations.  I told them that they would have to get that refilled from the hospice physician.  Follow Up Instructions:    -I discussed the assessment and treatment plan with the patient. The patient was provided an opportunity to ask questions and all were answered. The patient agreed with the plan and demonstrated an understanding of the instructions.   The patient was advised to call back  or seek an in-person evaluation if the symptoms worsen or if the condition fails to improve as anticipated.    Total time spent on today's visit was 25 minutes, including both face-to-face time and nonface-to-face time.  Time included that spent on review of records (prior notes available to me/labs/imaging if pertinent), discussing treatment and goals, answering patient's questions and coordinating care.   Alonza Bogus, DO

## 2019-04-25 ENCOUNTER — Other Ambulatory Visit: Payer: Self-pay

## 2019-04-25 ENCOUNTER — Telehealth (INDEPENDENT_AMBULATORY_CARE_PROVIDER_SITE_OTHER): Payer: Medicare Other | Admitting: Neurology

## 2019-04-25 DIAGNOSIS — G3183 Dementia with Lewy bodies: Secondary | ICD-10-CM | POA: Diagnosis not present

## 2019-04-25 DIAGNOSIS — F0280 Dementia in other diseases classified elsewhere without behavioral disturbance: Secondary | ICD-10-CM | POA: Diagnosis not present

## 2019-04-25 MED ORDER — RIVASTIGMINE 13.3 MG/24HR TD PT24: 13.3000 mg | MEDICATED_PATCH | Freq: Every day | TRANSDERMAL | 3 refills | Status: DC

## 2019-04-25 NOTE — Addendum Note (Signed)
Addended by: Drucilla Schmidt on: 04/25/2019 03:16 PM   Modules accepted: Orders

## 2019-04-26 ENCOUNTER — Telehealth: Payer: Medicare Other | Admitting: Neurology

## 2019-05-03 ENCOUNTER — Telehealth: Payer: Self-pay | Admitting: Neurology

## 2019-05-03 DIAGNOSIS — R441 Visual hallucinations: Secondary | ICD-10-CM

## 2019-05-03 NOTE — Telephone Encounter (Signed)
Patient's daughter-in-law called requesting the referral to Dr. Clovis Pu, Psychiatrist for the patient. She said the patient is ready to do this now and on-board.

## 2019-05-04 NOTE — Telephone Encounter (Signed)
Referral placed and daughter aware

## 2019-05-04 NOTE — Telephone Encounter (Signed)
I see patient has been sent to Hospice, is referral ok

## 2019-05-04 NOTE — Telephone Encounter (Signed)
Sure.  Dx: visual hallucinations

## 2019-05-05 ENCOUNTER — Telehealth: Payer: Self-pay | Admitting: Neurology

## 2019-05-05 NOTE — Telephone Encounter (Signed)
Patients daughter is aware

## 2019-05-05 NOTE — Telephone Encounter (Signed)
Patient's daughter in law called regarding Audrey [name]. She said that Dr. Beryle Quant office has not received the referral as of Yesterday. Also, they told her it would be 2 to 3 months out that is if he agreed to see Audrey [name]. Thank you

## 2019-05-05 NOTE — Telephone Encounter (Signed)
Yes, this didn't start yesterday (been going on for years, although getting worse).  Could also try and see if Dr. Casimiro Needle is faster.  Re: referral, you can call.  The referral should have been received immediately but psychiatry often doesn't require referral either.

## 2019-05-05 NOTE — Telephone Encounter (Signed)
Do you still want to refer to [place]

## 2019-05-21 DIAGNOSIS — M545 Low back pain: Secondary | ICD-10-CM | POA: Diagnosis not present

## 2019-05-21 DIAGNOSIS — F0390 Unspecified dementia without behavioral disturbance: Secondary | ICD-10-CM | POA: Diagnosis not present

## 2019-05-21 DIAGNOSIS — G4700 Insomnia, unspecified: Secondary | ICD-10-CM | POA: Diagnosis not present

## 2019-05-21 DIAGNOSIS — I10 Essential (primary) hypertension: Secondary | ICD-10-CM | POA: Diagnosis not present

## 2019-07-02 DIAGNOSIS — M792 Neuralgia and neuritis, unspecified: Secondary | ICD-10-CM | POA: Diagnosis not present

## 2019-07-02 DIAGNOSIS — F0390 Unspecified dementia without behavioral disturbance: Secondary | ICD-10-CM | POA: Diagnosis not present

## 2019-07-02 DIAGNOSIS — G4700 Insomnia, unspecified: Secondary | ICD-10-CM | POA: Diagnosis not present

## 2019-07-02 DIAGNOSIS — G894 Chronic pain syndrome: Secondary | ICD-10-CM | POA: Diagnosis not present

## 2019-07-02 DIAGNOSIS — R441 Visual hallucinations: Secondary | ICD-10-CM | POA: Diagnosis not present

## 2019-07-02 DIAGNOSIS — F5104 Psychophysiologic insomnia: Secondary | ICD-10-CM | POA: Diagnosis not present

## 2019-08-01 NOTE — Progress Notes (Signed)
Virtual Visit via Video Note   This visit type was conducted due to national recommendations for restrictions regarding the COVID-19 Pandemic (e.g. social distancing) in an effort to limit this patient's exposure and mitigate transmission in our community.  Due to her co-morbid illnesses, this patient is at least at moderate risk for complications without adequate follow up.  This format is felt to be most appropriate for this patient at this time.  All issues noted in this document were discussed and addressed.  A limited physical exam was performed with this format.  Please refer to the patient's chart for her consent to telehealth for [facility].   The patient was identified using 2 identifiers.  Date:  08/02/2019   ID:  Audrey [name], DOB Nov 20, 1926, MRN [id number]  Patient Location: Home Provider Location: Home  PCP:  Burnis Medin, MD  Cardiologist:  Lauree Chandler, MD  Electrophysiologist:  None   Evaluation Performed:  Follow-Up Visit  Chief Complaint:     History of Present Illness:    Audrey [name] is a 86 y.o. female with  history of coronary artery disease s/p 2V CABG in December 2004, HTN, HLD, bilateral carotid artery stenosis, AAA, as well as elevated liver enzymes on Lipitor   Patient had telemedicine visit with Dr. Angelena Form 07/2018 at which time she was doing well.  Patient visit with son and patient. Denies chest pain, dyspnea, dizziness or presyncope. On hospice care since December.  BP usually 129-135/80. Dementia has worsened-has a lot of illusions.  The patient does not have symptoms concerning for COVID-19 infection (fever, chills, cough, or new shortness of breath).    Past Medical History:  Diagnosis Date  . AAA (abdominal aortic aneurysm) (HCC)    small  . Bilateral carotid artery stenosis   . CAD (coronary artery disease)    including LM disease; s/p CABG  . Colon cancer ([place])   . History of chickenpox   . History of colon cancer    s/p  resection in 2004  . History of colonoscopy   . HTN (hypertension)   . Hyperlipidemia   . OA (osteoarthritis)    severe; s/p bilateral hip replacement   Past Surgical History:  Procedure Laterality Date  . CATARACT EXTRACTION, BILATERAL    . CORONARY ARTERY BYPASS GRAFT  2004  . left hip replacement  2007  . PARTIAL COLECTOMY  2004   sigmoid resection  . REFRACTIVE SURGERY  2013   had a film over eye removed.  . right hip replacement  2008     Current Meds  Medication Sig  . acetaminophen (TYLENOL) 500 MG tablet Take 500 mg by mouth every 6 (six) hours as needed for mild pain.  Marland Kitchen AMBULATORY NON FORMULARY MEDICATION Lift Chair DX:g30.1/f02.80  . aspirin EC 81 MG tablet Take 81 mg by mouth daily.  . Cholecalciferol (VITAMIN D3) 2000 units TABS Take 2,000 Units by mouth 2 (two) times daily.  Marland Kitchen gabapentin (NEURONTIN) 100 MG capsule TAKE 1 CAPSULE AT BEDTIME. (Patient taking differently: Take 300 mg by mouth at bedtime. )  . magnesium 30 MG tablet Take 30 mg by mouth at bedtime.  . melatonin 5 MG TABS Take 5 mg by mouth at bedtime.  . metoprolol succinate (TOPROL-XL) 50 MG 24 hr tablet Take 1 tablet (50 mg total) by mouth daily.  . Multiple Vitamins-Minerals (ALIVE WOMENS 50+ PO) Take 1 tablet by mouth daily.  . rivastigmine (EXELON) 13.3 MG/24HR Place 1 patch (13.3 mg  total) onto the skin daily.  Marland Kitchen telmisartan (MICARDIS) 20 MG tablet Take 1 tablet (20 mg total) by mouth daily.  . traMADol (ULTRAM) 50 MG tablet Take 100 mg by mouth at bedtime.  . traZODone (DESYREL) 100 MG tablet Take 100 mg by mouth at bedtime.  . TRUE METRIX BLOOD GLUCOSE TEST test strip CHECK BLOOD SUGAR ONCE OR TWICE A DAY OR AS DIRECTED BY MD.  . vitamin C (ASCORBIC ACID) 500 MG tablet Take 500 mg by mouth daily.  . [DISCONTINUED] aspirin (ADULT ASPIRIN EC LOW STRENGTH) 81 MG EC tablet Take 81 mg by mouth daily.    . [DISCONTINUED] metoprolol succinate (TOPROL-XL) 50 MG 24 hr tablet Take 1 tablet (50 mg total) by  mouth daily.  . [DISCONTINUED] telmisartan (MICARDIS) 20 MG tablet Take 1 tablet (20 mg total) by mouth daily.     Allergies:   Aricept [donepezil hcl]   Social History   Tobacco Use  . Smoking status: Never Smoker  . Smokeless tobacco: Never Used  Substance Use Topics  . Alcohol use: No  . Drug use: No     Family Hx: The patient's family history includes Cancer in her mother; Cholelithiasis in her mother; Diabetes in her sister; Heart attack in her father and sister; Heart disease in her father; Other in her brother. There is no history of Colon cancer.  ROS:   Please see the history of present illness.      All other systems reviewed and are negative.   Prior CV studies:   The following studies were reviewed today:  Carotid u/s June 2018 with stable moderate bilateral ICA stenosis. Abdominal u/s 11/18/10 showed very small AAA 2.1x 2.1 cm, stable. Stress test January 2016 with no ischemia.     Labs/Other Tests and Data Reviewed:    EKG:  No ECG reviewed.  Recent Labs: No results found for requested labs within last 8760 hours.   Recent Lipid Panel Lab Results  Component Value Date/Time   CHOL 114 10/03/2015 11:04 AM   TRIG 142.0 10/03/2015 11:04 AM   HDL 44.80 10/03/2015 11:04 AM   CHOLHDL 3 10/03/2015 11:04 AM   LDLCALC 41 10/03/2015 11:04 AM   LDLDIRECT 89.5 08/06/2007 09:51 AM    Wt Readings from Last 3 Encounters:  08/02/19 130 lb (59 kg)  08/03/18 137 lb (62.1 kg)  07/30/18 136 lb (61.7 kg)     Objective:    Vital Signs:  BP (!) 152/78   Pulse 63   Temp (!) 92 F (33.3 C)   Ht 5' (1.524 m)   Wt 130 lb (59 kg)   BMI 25.39 kg/m    VITAL SIGNS:  reviewed GEN:  no acute distress RESPIRATORY:  normal respiratory effort, symmetric expansion CARDIOVASCULAR:  no peripheral edema  ASSESSMENT & PLAN:    CAD status post CABG, Lexiscan 2016 no ischemia on aspirin  and Toprol, hospice stopped crestor and zetia. No angina  Essential hypertension  stable on telmisartan 20 mg daily and metoprolol 50 mg daily  Carotid artery disease with moderate bilateral carotid stenosis 08/2016 will not repeat due to advanced age  Hyperlipidemia managed by PCP-off crestor and zetia per hospice.  COVID-19 Education: The signs and symptoms of COVID-19 were discussed with the patient and how to seek care for testing (follow up with PCP or arrange E-visit).   The importance of social distancing was discussed today.  Time:   Today, I have spent 15  minutes with the patient with  telehealth technology discussing the above problems.     Medication Adjustments/Labs and Tests Ordered: Current medicines are reviewed at length with the patient today.  Concerns regarding medicines are outlined above.   Tests Ordered: No orders of the defined types were placed in this encounter.   Medication Changes: Meds ordered this encounter  Medications  . telmisartan (MICARDIS) 20 MG tablet    Sig: Take 1 tablet (20 mg total) by mouth daily.    Dispense:  90 tablet    Refill:  3  . metoprolol succinate (TOPROL-XL) 50 MG 24 hr tablet    Sig: Take 1 tablet (50 mg total) by mouth daily.    Dispense:  90 tablet    Refill:  3    Follow Up:  Either In Person or Virtual in 1 year(s) Dr. Angelena Form  Signed, Ermalinda Barrios, PA-C  08/02/2019 11:25 AM    [place]

## 2019-08-02 ENCOUNTER — Encounter: Payer: Self-pay | Admitting: Physician Assistant

## 2019-08-02 ENCOUNTER — Telehealth: Payer: Self-pay | Admitting: *Deleted

## 2019-08-02 ENCOUNTER — Telehealth (INDEPENDENT_AMBULATORY_CARE_PROVIDER_SITE_OTHER): Payer: Medicare Other | Admitting: Physician Assistant

## 2019-08-02 ENCOUNTER — Other Ambulatory Visit: Payer: Self-pay

## 2019-08-02 VITALS — BP 152/78 | HR 63 | Temp 92.0°F | Ht 60.0 in | Wt 130.0 lb

## 2019-08-02 DIAGNOSIS — I6523 Occlusion and stenosis of bilateral carotid arteries: Secondary | ICD-10-CM

## 2019-08-02 DIAGNOSIS — I10 Essential (primary) hypertension: Secondary | ICD-10-CM

## 2019-08-02 DIAGNOSIS — E785 Hyperlipidemia, unspecified: Secondary | ICD-10-CM | POA: Diagnosis not present

## 2019-08-02 DIAGNOSIS — I25810 Atherosclerosis of coronary artery bypass graft(s) without angina pectoris: Secondary | ICD-10-CM | POA: Diagnosis not present

## 2019-08-02 MED ORDER — TELMISARTAN 20 MG PO TABS: 20.0000 mg | ORAL_TABLET | Freq: Every day | ORAL | 3 refills | Status: DC

## 2019-08-02 MED ORDER — METOPROLOL SUCCINATE ER 50 MG PO TB24: 50.0000 mg | ORAL_TABLET | Freq: Every day | ORAL | 3 refills | Status: DC

## 2019-08-02 NOTE — Telephone Encounter (Signed)
  Patient Consent for Virtual Visit         Audrey [name] has provided verbal consent on 08/02/2019 for a virtual visit (video or telephone).   CONSENT FOR VIRTUAL VISIT FOR:  Audrey [name]  By participating in this virtual visit I agree to the following:  I hereby voluntarily request, consent and authorize [facility] and its employed or contracted physicians, physician assistants, nurse practitioners or other licensed health care professionals (the Practitioner), to provide me with telemedicine health care services (the "Services") as deemed necessary by the treating Practitioner. I acknowledge and consent to receive the Services by the Practitioner via telemedicine. I understand that the telemedicine visit will involve communicating with the Practitioner through live audiovisual communication technology and the disclosure of certain medical information by electronic transmission. I acknowledge that I have been given the opportunity to request an in-person assessment or other available alternative prior to the telemedicine visit and am voluntarily participating in the telemedicine visit.  I understand that I have the right to withhold or withdraw my consent to the use of telemedicine in the course of my care at any time, without affecting my right to future care or treatment, and that the Practitioner or I may terminate the telemedicine visit at any time. I understand that I have the right to inspect all information obtained and/or recorded in the course of the telemedicine visit and may receive copies of available information for a reasonable fee.  I understand that some of the potential risks of receiving the Services via telemedicine include:  Marland Kitchen Delay or interruption in medical evaluation due to technological equipment failure or disruption; . Information transmitted may not be sufficient (e.g. poor resolution of images) to allow for appropriate medical decision making by the Practitioner;  and/or  . In rare instances, security protocols could fail, causing a breach of personal health information.  Furthermore, I acknowledge that it is my responsibility to provide information about my medical history, conditions and care that is complete and accurate to the best of my ability. I acknowledge that Practitioner's advice, recommendations, and/or decision may be based on factors not within their control, such as incomplete or inaccurate data provided by me or distortions of diagnostic images or specimens that may result from electronic transmissions. I understand that the practice of medicine is not an exact science and that Practitioner makes no warranties or guarantees regarding treatment outcomes. I acknowledge that a copy of this consent can be made available to me via my patient portal ([facility]), or I can request a printed copy by calling the office of [facility].    I understand that my insurance will be billed for this visit.   I have read or had this consent read to me. . I understand the contents of this consent, which adequately explains the benefits and risks of the Services being provided via telemedicine.  . I have been provided ample opportunity to ask questions regarding this consent and the Services and have had my questions answered to my satisfaction. . I give my informed consent for the services to be provided through the use of telemedicine in my medical care

## 2019-08-02 NOTE — Patient Instructions (Signed)
Medication Instructions:  Your physician recommends that you continue on your current medications as directed. Please refer to the Current Medication list given to you today.  *If you need a refill on your cardiac medications before your next appointment, please call your pharmacy*   Lab Work: None ordered  If you have labs (blood work) drawn today and your tests are completely normal, you will receive your results only by: Marland Kitchen MyChart Message (if you have MyChart) OR . A paper copy in the mail If you have any lab test that is abnormal or we need to change your treatment, we will call you to review the results.   Testing/Procedures: None ordered   Follow-Up: At [facility], you and your health needs are our priority.  As part of our continuing mission to provide you with exceptional heart care, we have created designated Provider Care Teams.  These Care Teams include your primary Cardiologist (physician) and Advanced Practice Providers (APPs -  Physician Assistants and Nurse Practitioners) who all work together to provide you with the care you need, when you need it.  We recommend signing up for the patient portal called "MyChart".  Sign up information is provided on this After Visit Summary.  MyChart is used to connect with patients for Virtual Visits (Telemedicine).  Patients are able to view lab/test results, encounter notes, upcoming appointments, etc.  Non-urgent messages can be sent to your provider as well.   To learn more about what you can do with MyChart, go to [email or web address].    Your next appointment:   12 month(s)  The format for your next appointment:   Virtual Visit   Provider:   You may see Lauree Chandler, MD or one of the following Advanced Practice Providers on your designated Care Team:    Melina Copa, PA-C  Ermalinda Barrios, PA-C    Other Instructions

## 2019-08-18 DIAGNOSIS — M1711 Unilateral primary osteoarthritis, right knee: Secondary | ICD-10-CM | POA: Diagnosis not present

## 2019-08-18 DIAGNOSIS — E138 Other specified diabetes mellitus with unspecified complications: Secondary | ICD-10-CM | POA: Diagnosis not present

## 2019-08-18 DIAGNOSIS — G894 Chronic pain syndrome: Secondary | ICD-10-CM | POA: Diagnosis not present

## 2019-08-18 DIAGNOSIS — M8000XS Age-related osteoporosis with current pathological fracture, unspecified site, sequela: Secondary | ICD-10-CM | POA: Diagnosis not present

## 2019-08-18 DIAGNOSIS — M48061 Spinal stenosis, lumbar region without neurogenic claudication: Secondary | ICD-10-CM | POA: Diagnosis not present

## 2019-08-18 DIAGNOSIS — C189 Malignant neoplasm of colon, unspecified: Secondary | ICD-10-CM | POA: Diagnosis not present

## 2019-08-23 ENCOUNTER — Telehealth: Payer: Self-pay

## 2019-08-23 MED ORDER — RIVASTIGMINE 13.3 MG/24HR TD PT24: 13.3000 mg | MEDICATED_PATCH | Freq: Every day | TRANSDERMAL | 3 refills | Status: DC

## 2019-08-23 NOTE — Telephone Encounter (Signed)
Received call from patients son requesting a refill on the Exelon patches sent to [facility].  Informed son that I would send rx to pharmacy. He voiced understanding.   Rx(s) sent to pharmacy electronically.

## 2019-09-22 DIAGNOSIS — M1711 Unilateral primary osteoarthritis, right knee: Secondary | ICD-10-CM | POA: Diagnosis not present

## 2019-09-22 DIAGNOSIS — E138 Other specified diabetes mellitus with unspecified complications: Secondary | ICD-10-CM | POA: Diagnosis not present

## 2019-09-22 DIAGNOSIS — M48061 Spinal stenosis, lumbar region without neurogenic claudication: Secondary | ICD-10-CM | POA: Diagnosis not present

## 2019-09-22 DIAGNOSIS — G894 Chronic pain syndrome: Secondary | ICD-10-CM | POA: Diagnosis not present

## 2019-09-22 DIAGNOSIS — M8000XS Age-related osteoporosis with current pathological fracture, unspecified site, sequela: Secondary | ICD-10-CM | POA: Diagnosis not present

## 2019-09-22 DIAGNOSIS — C189 Malignant neoplasm of colon, unspecified: Secondary | ICD-10-CM | POA: Diagnosis not present

## 2019-10-15 DIAGNOSIS — G894 Chronic pain syndrome: Secondary | ICD-10-CM | POA: Diagnosis not present

## 2019-10-15 DIAGNOSIS — F5104 Psychophysiologic insomnia: Secondary | ICD-10-CM | POA: Diagnosis not present

## 2019-10-15 DIAGNOSIS — B372 Candidiasis of skin and nail: Secondary | ICD-10-CM | POA: Diagnosis not present

## 2019-10-15 DIAGNOSIS — I10 Essential (primary) hypertension: Secondary | ICD-10-CM | POA: Diagnosis not present

## 2019-10-15 DIAGNOSIS — F0390 Unspecified dementia without behavioral disturbance: Secondary | ICD-10-CM | POA: Diagnosis not present

## 2019-10-21 DIAGNOSIS — Z131 Encounter for screening for diabetes mellitus: Secondary | ICD-10-CM | POA: Diagnosis not present

## 2019-10-21 DIAGNOSIS — I10 Essential (primary) hypertension: Secondary | ICD-10-CM | POA: Diagnosis not present

## 2019-12-01 DIAGNOSIS — M1711 Unilateral primary osteoarthritis, right knee: Secondary | ICD-10-CM | POA: Diagnosis not present

## 2019-12-01 DIAGNOSIS — M8000XS Age-related osteoporosis with current pathological fracture, unspecified site, sequela: Secondary | ICD-10-CM | POA: Diagnosis not present

## 2019-12-01 DIAGNOSIS — C189 Malignant neoplasm of colon, unspecified: Secondary | ICD-10-CM | POA: Diagnosis not present

## 2019-12-01 DIAGNOSIS — M48061 Spinal stenosis, lumbar region without neurogenic claudication: Secondary | ICD-10-CM | POA: Diagnosis not present

## 2019-12-01 DIAGNOSIS — G894 Chronic pain syndrome: Secondary | ICD-10-CM | POA: Diagnosis not present

## 2019-12-01 DIAGNOSIS — E138 Other specified diabetes mellitus with unspecified complications: Secondary | ICD-10-CM | POA: Diagnosis not present

## 2019-12-06 DIAGNOSIS — E138 Other specified diabetes mellitus with unspecified complications: Secondary | ICD-10-CM | POA: Diagnosis not present

## 2019-12-06 DIAGNOSIS — G894 Chronic pain syndrome: Secondary | ICD-10-CM | POA: Diagnosis not present

## 2019-12-06 DIAGNOSIS — M1711 Unilateral primary osteoarthritis, right knee: Secondary | ICD-10-CM | POA: Diagnosis not present

## 2019-12-07 DIAGNOSIS — E138 Other specified diabetes mellitus with unspecified complications: Secondary | ICD-10-CM | POA: Diagnosis not present

## 2019-12-07 DIAGNOSIS — G894 Chronic pain syndrome: Secondary | ICD-10-CM | POA: Diagnosis not present

## 2019-12-07 DIAGNOSIS — M1711 Unilateral primary osteoarthritis, right knee: Secondary | ICD-10-CM | POA: Diagnosis not present

## 2019-12-13 ENCOUNTER — Telehealth: Payer: Self-pay | Admitting: Neurology

## 2019-12-13 ENCOUNTER — Other Ambulatory Visit: Payer: Self-pay

## 2019-12-13 DIAGNOSIS — R441 Visual hallucinations: Secondary | ICD-10-CM

## 2019-12-13 NOTE — Telephone Encounter (Signed)
I contacted [facility] and left message, will send referral.

## 2019-12-13 NOTE — Telephone Encounter (Signed)
Please advise 

## 2019-12-13 NOTE — Telephone Encounter (Signed)
Referral to Dr. Casimiro Needle is fine (for visual hallucinations in patient with Lewy Body Dementia)

## 2019-12-13 NOTE — Telephone Encounter (Signed)
Almyra Free with [facility] called to see if Dr. Carles Collet recommends a referral to Dr. Casimiro Needle for this patient.

## 2019-12-13 NOTE — Telephone Encounter (Signed)
Referral placed.

## 2019-12-17 DIAGNOSIS — M1711 Unilateral primary osteoarthritis, right knee: Secondary | ICD-10-CM | POA: Diagnosis not present

## 2019-12-17 DIAGNOSIS — G894 Chronic pain syndrome: Secondary | ICD-10-CM | POA: Diagnosis not present

## 2019-12-17 DIAGNOSIS — E138 Other specified diabetes mellitus with unspecified complications: Secondary | ICD-10-CM | POA: Diagnosis not present

## 2019-12-30 DIAGNOSIS — F0390 Unspecified dementia without behavioral disturbance: Secondary | ICD-10-CM | POA: Diagnosis not present

## 2019-12-30 DIAGNOSIS — I10 Essential (primary) hypertension: Secondary | ICD-10-CM | POA: Diagnosis not present

## 2019-12-30 DIAGNOSIS — G894 Chronic pain syndrome: Secondary | ICD-10-CM | POA: Diagnosis not present

## 2019-12-30 DIAGNOSIS — F5104 Psychophysiologic insomnia: Secondary | ICD-10-CM | POA: Diagnosis not present

## 2020-01-02 DIAGNOSIS — N184 Chronic kidney disease, stage 4 (severe): Secondary | ICD-10-CM | POA: Diagnosis not present

## 2020-01-02 DIAGNOSIS — R410 Disorientation, unspecified: Secondary | ICD-10-CM | POA: Diagnosis not present

## 2020-01-02 DIAGNOSIS — I2510 Atherosclerotic heart disease of native coronary artery without angina pectoris: Secondary | ICD-10-CM | POA: Diagnosis not present

## 2020-01-02 DIAGNOSIS — E1140 Type 2 diabetes mellitus with diabetic neuropathy, unspecified: Secondary | ICD-10-CM | POA: Diagnosis not present

## 2020-01-02 DIAGNOSIS — H3530 Unspecified macular degeneration: Secondary | ICD-10-CM | POA: Diagnosis not present

## 2020-01-02 DIAGNOSIS — M4800 Spinal stenosis, site unspecified: Secondary | ICD-10-CM | POA: Diagnosis not present

## 2020-01-02 DIAGNOSIS — F0280 Dementia in other diseases classified elsewhere without behavioral disturbance: Secondary | ICD-10-CM | POA: Diagnosis not present

## 2020-01-02 DIAGNOSIS — I6523 Occlusion and stenosis of bilateral carotid arteries: Secondary | ICD-10-CM | POA: Diagnosis not present

## 2020-01-02 DIAGNOSIS — M1990 Unspecified osteoarthritis, unspecified site: Secondary | ICD-10-CM | POA: Diagnosis not present

## 2020-01-02 DIAGNOSIS — I129 Hypertensive chronic kidney disease with stage 1 through stage 4 chronic kidney disease, or unspecified chronic kidney disease: Secondary | ICD-10-CM | POA: Diagnosis not present

## 2020-01-02 DIAGNOSIS — M549 Dorsalgia, unspecified: Secondary | ICD-10-CM | POA: Diagnosis not present

## 2020-01-02 DIAGNOSIS — G3183 Dementia with Lewy bodies: Secondary | ICD-10-CM | POA: Diagnosis not present

## 2020-01-02 DIAGNOSIS — R634 Abnormal weight loss: Secondary | ICD-10-CM | POA: Diagnosis not present

## 2020-01-03 DIAGNOSIS — I129 Hypertensive chronic kidney disease with stage 1 through stage 4 chronic kidney disease, or unspecified chronic kidney disease: Secondary | ICD-10-CM | POA: Diagnosis not present

## 2020-01-03 DIAGNOSIS — G3183 Dementia with Lewy bodies: Secondary | ICD-10-CM | POA: Diagnosis not present

## 2020-01-03 DIAGNOSIS — I2510 Atherosclerotic heart disease of native coronary artery without angina pectoris: Secondary | ICD-10-CM | POA: Diagnosis not present

## 2020-01-03 DIAGNOSIS — R634 Abnormal weight loss: Secondary | ICD-10-CM | POA: Diagnosis not present

## 2020-01-03 DIAGNOSIS — F0280 Dementia in other diseases classified elsewhere without behavioral disturbance: Secondary | ICD-10-CM | POA: Diagnosis not present

## 2020-01-03 DIAGNOSIS — N184 Chronic kidney disease, stage 4 (severe): Secondary | ICD-10-CM | POA: Diagnosis not present

## 2020-01-04 DIAGNOSIS — F0280 Dementia in other diseases classified elsewhere without behavioral disturbance: Secondary | ICD-10-CM | POA: Diagnosis not present

## 2020-01-04 DIAGNOSIS — G3183 Dementia with Lewy bodies: Secondary | ICD-10-CM | POA: Diagnosis not present

## 2020-01-04 DIAGNOSIS — I2510 Atherosclerotic heart disease of native coronary artery without angina pectoris: Secondary | ICD-10-CM | POA: Diagnosis not present

## 2020-01-04 DIAGNOSIS — N184 Chronic kidney disease, stage 4 (severe): Secondary | ICD-10-CM | POA: Diagnosis not present

## 2020-01-04 DIAGNOSIS — R634 Abnormal weight loss: Secondary | ICD-10-CM | POA: Diagnosis not present

## 2020-01-04 DIAGNOSIS — I129 Hypertensive chronic kidney disease with stage 1 through stage 4 chronic kidney disease, or unspecified chronic kidney disease: Secondary | ICD-10-CM | POA: Diagnosis not present

## 2020-01-05 DIAGNOSIS — F0280 Dementia in other diseases classified elsewhere without behavioral disturbance: Secondary | ICD-10-CM | POA: Diagnosis not present

## 2020-01-05 DIAGNOSIS — I2510 Atherosclerotic heart disease of native coronary artery without angina pectoris: Secondary | ICD-10-CM | POA: Diagnosis not present

## 2020-01-05 DIAGNOSIS — R634 Abnormal weight loss: Secondary | ICD-10-CM | POA: Diagnosis not present

## 2020-01-05 DIAGNOSIS — G3183 Dementia with Lewy bodies: Secondary | ICD-10-CM | POA: Diagnosis not present

## 2020-01-05 DIAGNOSIS — I129 Hypertensive chronic kidney disease with stage 1 through stage 4 chronic kidney disease, or unspecified chronic kidney disease: Secondary | ICD-10-CM | POA: Diagnosis not present

## 2020-01-05 DIAGNOSIS — N184 Chronic kidney disease, stage 4 (severe): Secondary | ICD-10-CM | POA: Diagnosis not present

## 2020-01-06 DIAGNOSIS — G3183 Dementia with Lewy bodies: Secondary | ICD-10-CM | POA: Diagnosis not present

## 2020-01-06 DIAGNOSIS — I2510 Atherosclerotic heart disease of native coronary artery without angina pectoris: Secondary | ICD-10-CM | POA: Diagnosis not present

## 2020-01-06 DIAGNOSIS — F0280 Dementia in other diseases classified elsewhere without behavioral disturbance: Secondary | ICD-10-CM | POA: Diagnosis not present

## 2020-01-06 DIAGNOSIS — N184 Chronic kidney disease, stage 4 (severe): Secondary | ICD-10-CM | POA: Diagnosis not present

## 2020-01-06 DIAGNOSIS — I129 Hypertensive chronic kidney disease with stage 1 through stage 4 chronic kidney disease, or unspecified chronic kidney disease: Secondary | ICD-10-CM | POA: Diagnosis not present

## 2020-01-06 DIAGNOSIS — R634 Abnormal weight loss: Secondary | ICD-10-CM | POA: Diagnosis not present

## 2020-01-09 DIAGNOSIS — I2510 Atherosclerotic heart disease of native coronary artery without angina pectoris: Secondary | ICD-10-CM | POA: Diagnosis not present

## 2020-01-09 DIAGNOSIS — N184 Chronic kidney disease, stage 4 (severe): Secondary | ICD-10-CM | POA: Diagnosis not present

## 2020-01-09 DIAGNOSIS — I129 Hypertensive chronic kidney disease with stage 1 through stage 4 chronic kidney disease, or unspecified chronic kidney disease: Secondary | ICD-10-CM | POA: Diagnosis not present

## 2020-01-09 DIAGNOSIS — R634 Abnormal weight loss: Secondary | ICD-10-CM | POA: Diagnosis not present

## 2020-01-09 DIAGNOSIS — G3183 Dementia with Lewy bodies: Secondary | ICD-10-CM | POA: Diagnosis not present

## 2020-01-09 DIAGNOSIS — F0280 Dementia in other diseases classified elsewhere without behavioral disturbance: Secondary | ICD-10-CM | POA: Diagnosis not present

## 2020-01-10 DIAGNOSIS — R634 Abnormal weight loss: Secondary | ICD-10-CM | POA: Diagnosis not present

## 2020-01-10 DIAGNOSIS — N184 Chronic kidney disease, stage 4 (severe): Secondary | ICD-10-CM | POA: Diagnosis not present

## 2020-01-10 DIAGNOSIS — I2510 Atherosclerotic heart disease of native coronary artery without angina pectoris: Secondary | ICD-10-CM | POA: Diagnosis not present

## 2020-01-10 DIAGNOSIS — G3183 Dementia with Lewy bodies: Secondary | ICD-10-CM | POA: Diagnosis not present

## 2020-01-10 DIAGNOSIS — I129 Hypertensive chronic kidney disease with stage 1 through stage 4 chronic kidney disease, or unspecified chronic kidney disease: Secondary | ICD-10-CM | POA: Diagnosis not present

## 2020-01-10 DIAGNOSIS — F0280 Dementia in other diseases classified elsewhere without behavioral disturbance: Secondary | ICD-10-CM | POA: Diagnosis not present

## 2020-01-13 DIAGNOSIS — N184 Chronic kidney disease, stage 4 (severe): Secondary | ICD-10-CM | POA: Diagnosis not present

## 2020-01-13 DIAGNOSIS — R634 Abnormal weight loss: Secondary | ICD-10-CM | POA: Diagnosis not present

## 2020-01-13 DIAGNOSIS — I129 Hypertensive chronic kidney disease with stage 1 through stage 4 chronic kidney disease, or unspecified chronic kidney disease: Secondary | ICD-10-CM | POA: Diagnosis not present

## 2020-01-13 DIAGNOSIS — I2510 Atherosclerotic heart disease of native coronary artery without angina pectoris: Secondary | ICD-10-CM | POA: Diagnosis not present

## 2020-01-13 DIAGNOSIS — F0280 Dementia in other diseases classified elsewhere without behavioral disturbance: Secondary | ICD-10-CM | POA: Diagnosis not present

## 2020-01-13 DIAGNOSIS — G3183 Dementia with Lewy bodies: Secondary | ICD-10-CM | POA: Diagnosis not present

## 2020-01-16 DIAGNOSIS — G3183 Dementia with Lewy bodies: Secondary | ICD-10-CM | POA: Diagnosis not present

## 2020-01-16 DIAGNOSIS — N184 Chronic kidney disease, stage 4 (severe): Secondary | ICD-10-CM | POA: Diagnosis not present

## 2020-01-16 DIAGNOSIS — I129 Hypertensive chronic kidney disease with stage 1 through stage 4 chronic kidney disease, or unspecified chronic kidney disease: Secondary | ICD-10-CM | POA: Diagnosis not present

## 2020-01-16 DIAGNOSIS — I2510 Atherosclerotic heart disease of native coronary artery without angina pectoris: Secondary | ICD-10-CM | POA: Diagnosis not present

## 2020-01-16 DIAGNOSIS — R634 Abnormal weight loss: Secondary | ICD-10-CM | POA: Diagnosis not present

## 2020-01-16 DIAGNOSIS — F0280 Dementia in other diseases classified elsewhere without behavioral disturbance: Secondary | ICD-10-CM | POA: Diagnosis not present

## 2020-01-21 DIAGNOSIS — F0280 Dementia in other diseases classified elsewhere without behavioral disturbance: Secondary | ICD-10-CM | POA: Diagnosis not present

## 2020-01-21 DIAGNOSIS — R634 Abnormal weight loss: Secondary | ICD-10-CM | POA: Diagnosis not present

## 2020-01-21 DIAGNOSIS — I2510 Atherosclerotic heart disease of native coronary artery without angina pectoris: Secondary | ICD-10-CM | POA: Diagnosis not present

## 2020-01-21 DIAGNOSIS — I129 Hypertensive chronic kidney disease with stage 1 through stage 4 chronic kidney disease, or unspecified chronic kidney disease: Secondary | ICD-10-CM | POA: Diagnosis not present

## 2020-01-21 DIAGNOSIS — G3183 Dementia with Lewy bodies: Secondary | ICD-10-CM | POA: Diagnosis not present

## 2020-01-21 DIAGNOSIS — N184 Chronic kidney disease, stage 4 (severe): Secondary | ICD-10-CM | POA: Diagnosis not present

## 2020-01-23 DIAGNOSIS — E1140 Type 2 diabetes mellitus with diabetic neuropathy, unspecified: Secondary | ICD-10-CM | POA: Diagnosis not present

## 2020-01-23 DIAGNOSIS — I2510 Atherosclerotic heart disease of native coronary artery without angina pectoris: Secondary | ICD-10-CM | POA: Diagnosis not present

## 2020-01-23 DIAGNOSIS — M4800 Spinal stenosis, site unspecified: Secondary | ICD-10-CM | POA: Diagnosis not present

## 2020-01-23 DIAGNOSIS — R410 Disorientation, unspecified: Secondary | ICD-10-CM | POA: Diagnosis not present

## 2020-01-23 DIAGNOSIS — G3183 Dementia with Lewy bodies: Secondary | ICD-10-CM | POA: Diagnosis not present

## 2020-01-23 DIAGNOSIS — H3530 Unspecified macular degeneration: Secondary | ICD-10-CM | POA: Diagnosis not present

## 2020-01-23 DIAGNOSIS — N184 Chronic kidney disease, stage 4 (severe): Secondary | ICD-10-CM | POA: Diagnosis not present

## 2020-01-23 DIAGNOSIS — F0280 Dementia in other diseases classified elsewhere without behavioral disturbance: Secondary | ICD-10-CM | POA: Diagnosis not present

## 2020-01-23 DIAGNOSIS — I6523 Occlusion and stenosis of bilateral carotid arteries: Secondary | ICD-10-CM | POA: Diagnosis not present

## 2020-01-23 DIAGNOSIS — R634 Abnormal weight loss: Secondary | ICD-10-CM | POA: Diagnosis not present

## 2020-01-23 DIAGNOSIS — M549 Dorsalgia, unspecified: Secondary | ICD-10-CM | POA: Diagnosis not present

## 2020-01-23 DIAGNOSIS — M1990 Unspecified osteoarthritis, unspecified site: Secondary | ICD-10-CM | POA: Diagnosis not present

## 2020-01-23 DIAGNOSIS — I129 Hypertensive chronic kidney disease with stage 1 through stage 4 chronic kidney disease, or unspecified chronic kidney disease: Secondary | ICD-10-CM | POA: Diagnosis not present

## 2020-01-24 DIAGNOSIS — N184 Chronic kidney disease, stage 4 (severe): Secondary | ICD-10-CM | POA: Diagnosis not present

## 2020-01-24 DIAGNOSIS — F0280 Dementia in other diseases classified elsewhere without behavioral disturbance: Secondary | ICD-10-CM | POA: Diagnosis not present

## 2020-01-24 DIAGNOSIS — G3183 Dementia with Lewy bodies: Secondary | ICD-10-CM | POA: Diagnosis not present

## 2020-01-24 DIAGNOSIS — I129 Hypertensive chronic kidney disease with stage 1 through stage 4 chronic kidney disease, or unspecified chronic kidney disease: Secondary | ICD-10-CM | POA: Diagnosis not present

## 2020-01-24 DIAGNOSIS — R634 Abnormal weight loss: Secondary | ICD-10-CM | POA: Diagnosis not present

## 2020-01-24 DIAGNOSIS — I2510 Atherosclerotic heart disease of native coronary artery without angina pectoris: Secondary | ICD-10-CM | POA: Diagnosis not present

## 2020-01-25 DIAGNOSIS — G3183 Dementia with Lewy bodies: Secondary | ICD-10-CM | POA: Diagnosis not present

## 2020-01-25 DIAGNOSIS — F0280 Dementia in other diseases classified elsewhere without behavioral disturbance: Secondary | ICD-10-CM | POA: Diagnosis not present

## 2020-01-25 DIAGNOSIS — I2510 Atherosclerotic heart disease of native coronary artery without angina pectoris: Secondary | ICD-10-CM | POA: Diagnosis not present

## 2020-01-25 DIAGNOSIS — N184 Chronic kidney disease, stage 4 (severe): Secondary | ICD-10-CM | POA: Diagnosis not present

## 2020-01-25 DIAGNOSIS — R634 Abnormal weight loss: Secondary | ICD-10-CM | POA: Diagnosis not present

## 2020-01-25 DIAGNOSIS — I129 Hypertensive chronic kidney disease with stage 1 through stage 4 chronic kidney disease, or unspecified chronic kidney disease: Secondary | ICD-10-CM | POA: Diagnosis not present

## 2020-01-27 DIAGNOSIS — R634 Abnormal weight loss: Secondary | ICD-10-CM | POA: Diagnosis not present

## 2020-01-27 DIAGNOSIS — I2510 Atherosclerotic heart disease of native coronary artery without angina pectoris: Secondary | ICD-10-CM | POA: Diagnosis not present

## 2020-01-27 DIAGNOSIS — I129 Hypertensive chronic kidney disease with stage 1 through stage 4 chronic kidney disease, or unspecified chronic kidney disease: Secondary | ICD-10-CM | POA: Diagnosis not present

## 2020-01-27 DIAGNOSIS — G3183 Dementia with Lewy bodies: Secondary | ICD-10-CM | POA: Diagnosis not present

## 2020-01-27 DIAGNOSIS — F0280 Dementia in other diseases classified elsewhere without behavioral disturbance: Secondary | ICD-10-CM | POA: Diagnosis not present

## 2020-01-27 DIAGNOSIS — N184 Chronic kidney disease, stage 4 (severe): Secondary | ICD-10-CM | POA: Diagnosis not present

## 2020-01-31 DIAGNOSIS — I2510 Atherosclerotic heart disease of native coronary artery without angina pectoris: Secondary | ICD-10-CM | POA: Diagnosis not present

## 2020-01-31 DIAGNOSIS — I129 Hypertensive chronic kidney disease with stage 1 through stage 4 chronic kidney disease, or unspecified chronic kidney disease: Secondary | ICD-10-CM | POA: Diagnosis not present

## 2020-01-31 DIAGNOSIS — G3183 Dementia with Lewy bodies: Secondary | ICD-10-CM | POA: Diagnosis not present

## 2020-01-31 DIAGNOSIS — R634 Abnormal weight loss: Secondary | ICD-10-CM | POA: Diagnosis not present

## 2020-01-31 DIAGNOSIS — F0280 Dementia in other diseases classified elsewhere without behavioral disturbance: Secondary | ICD-10-CM | POA: Diagnosis not present

## 2020-01-31 DIAGNOSIS — N184 Chronic kidney disease, stage 4 (severe): Secondary | ICD-10-CM | POA: Diagnosis not present

## 2020-02-03 DIAGNOSIS — I129 Hypertensive chronic kidney disease with stage 1 through stage 4 chronic kidney disease, or unspecified chronic kidney disease: Secondary | ICD-10-CM | POA: Diagnosis not present

## 2020-02-03 DIAGNOSIS — R634 Abnormal weight loss: Secondary | ICD-10-CM | POA: Diagnosis not present

## 2020-02-03 DIAGNOSIS — F0280 Dementia in other diseases classified elsewhere without behavioral disturbance: Secondary | ICD-10-CM | POA: Diagnosis not present

## 2020-02-03 DIAGNOSIS — N184 Chronic kidney disease, stage 4 (severe): Secondary | ICD-10-CM | POA: Diagnosis not present

## 2020-02-03 DIAGNOSIS — I2510 Atherosclerotic heart disease of native coronary artery without angina pectoris: Secondary | ICD-10-CM | POA: Diagnosis not present

## 2020-02-03 DIAGNOSIS — G3183 Dementia with Lewy bodies: Secondary | ICD-10-CM | POA: Diagnosis not present

## 2020-02-07 DIAGNOSIS — I129 Hypertensive chronic kidney disease with stage 1 through stage 4 chronic kidney disease, or unspecified chronic kidney disease: Secondary | ICD-10-CM | POA: Diagnosis not present

## 2020-02-07 DIAGNOSIS — R634 Abnormal weight loss: Secondary | ICD-10-CM | POA: Diagnosis not present

## 2020-02-07 DIAGNOSIS — I10 Essential (primary) hypertension: Secondary | ICD-10-CM | POA: Diagnosis not present

## 2020-02-07 DIAGNOSIS — G894 Chronic pain syndrome: Secondary | ICD-10-CM | POA: Diagnosis not present

## 2020-02-07 DIAGNOSIS — I2510 Atherosclerotic heart disease of native coronary artery without angina pectoris: Secondary | ICD-10-CM | POA: Diagnosis not present

## 2020-02-07 DIAGNOSIS — F0390 Unspecified dementia without behavioral disturbance: Secondary | ICD-10-CM | POA: Diagnosis not present

## 2020-02-07 DIAGNOSIS — F0280 Dementia in other diseases classified elsewhere without behavioral disturbance: Secondary | ICD-10-CM | POA: Diagnosis not present

## 2020-02-07 DIAGNOSIS — N184 Chronic kidney disease, stage 4 (severe): Secondary | ICD-10-CM | POA: Diagnosis not present

## 2020-02-07 DIAGNOSIS — F5104 Psychophysiologic insomnia: Secondary | ICD-10-CM | POA: Diagnosis not present

## 2020-02-07 DIAGNOSIS — G3183 Dementia with Lewy bodies: Secondary | ICD-10-CM | POA: Diagnosis not present

## 2020-02-10 DIAGNOSIS — F0280 Dementia in other diseases classified elsewhere without behavioral disturbance: Secondary | ICD-10-CM | POA: Diagnosis not present

## 2020-02-10 DIAGNOSIS — I2510 Atherosclerotic heart disease of native coronary artery without angina pectoris: Secondary | ICD-10-CM | POA: Diagnosis not present

## 2020-02-10 DIAGNOSIS — N184 Chronic kidney disease, stage 4 (severe): Secondary | ICD-10-CM | POA: Diagnosis not present

## 2020-02-10 DIAGNOSIS — R634 Abnormal weight loss: Secondary | ICD-10-CM | POA: Diagnosis not present

## 2020-02-10 DIAGNOSIS — I129 Hypertensive chronic kidney disease with stage 1 through stage 4 chronic kidney disease, or unspecified chronic kidney disease: Secondary | ICD-10-CM | POA: Diagnosis not present

## 2020-02-10 DIAGNOSIS — G3183 Dementia with Lewy bodies: Secondary | ICD-10-CM | POA: Diagnosis not present

## 2020-02-12 DIAGNOSIS — I2510 Atherosclerotic heart disease of native coronary artery without angina pectoris: Secondary | ICD-10-CM | POA: Diagnosis not present

## 2020-02-12 DIAGNOSIS — N184 Chronic kidney disease, stage 4 (severe): Secondary | ICD-10-CM | POA: Diagnosis not present

## 2020-02-12 DIAGNOSIS — G3183 Dementia with Lewy bodies: Secondary | ICD-10-CM | POA: Diagnosis not present

## 2020-02-12 DIAGNOSIS — R634 Abnormal weight loss: Secondary | ICD-10-CM | POA: Diagnosis not present

## 2020-02-12 DIAGNOSIS — I129 Hypertensive chronic kidney disease with stage 1 through stage 4 chronic kidney disease, or unspecified chronic kidney disease: Secondary | ICD-10-CM | POA: Diagnosis not present

## 2020-02-12 DIAGNOSIS — F0280 Dementia in other diseases classified elsewhere without behavioral disturbance: Secondary | ICD-10-CM | POA: Diagnosis not present

## 2020-02-14 DIAGNOSIS — R634 Abnormal weight loss: Secondary | ICD-10-CM | POA: Diagnosis not present

## 2020-02-14 DIAGNOSIS — N184 Chronic kidney disease, stage 4 (severe): Secondary | ICD-10-CM | POA: Diagnosis not present

## 2020-02-14 DIAGNOSIS — I2510 Atherosclerotic heart disease of native coronary artery without angina pectoris: Secondary | ICD-10-CM | POA: Diagnosis not present

## 2020-02-14 DIAGNOSIS — I129 Hypertensive chronic kidney disease with stage 1 through stage 4 chronic kidney disease, or unspecified chronic kidney disease: Secondary | ICD-10-CM | POA: Diagnosis not present

## 2020-02-14 DIAGNOSIS — F0280 Dementia in other diseases classified elsewhere without behavioral disturbance: Secondary | ICD-10-CM | POA: Diagnosis not present

## 2020-02-14 DIAGNOSIS — G3183 Dementia with Lewy bodies: Secondary | ICD-10-CM | POA: Diagnosis not present

## 2020-02-15 DIAGNOSIS — I2510 Atherosclerotic heart disease of native coronary artery without angina pectoris: Secondary | ICD-10-CM | POA: Diagnosis not present

## 2020-02-15 DIAGNOSIS — I129 Hypertensive chronic kidney disease with stage 1 through stage 4 chronic kidney disease, or unspecified chronic kidney disease: Secondary | ICD-10-CM | POA: Diagnosis not present

## 2020-02-15 DIAGNOSIS — R634 Abnormal weight loss: Secondary | ICD-10-CM | POA: Diagnosis not present

## 2020-02-15 DIAGNOSIS — G3183 Dementia with Lewy bodies: Secondary | ICD-10-CM | POA: Diagnosis not present

## 2020-02-15 DIAGNOSIS — N184 Chronic kidney disease, stage 4 (severe): Secondary | ICD-10-CM | POA: Diagnosis not present

## 2020-02-15 DIAGNOSIS — F0280 Dementia in other diseases classified elsewhere without behavioral disturbance: Secondary | ICD-10-CM | POA: Diagnosis not present

## 2020-02-17 DIAGNOSIS — I2510 Atherosclerotic heart disease of native coronary artery without angina pectoris: Secondary | ICD-10-CM | POA: Diagnosis not present

## 2020-02-17 DIAGNOSIS — G3183 Dementia with Lewy bodies: Secondary | ICD-10-CM | POA: Diagnosis not present

## 2020-02-17 DIAGNOSIS — I129 Hypertensive chronic kidney disease with stage 1 through stage 4 chronic kidney disease, or unspecified chronic kidney disease: Secondary | ICD-10-CM | POA: Diagnosis not present

## 2020-02-17 DIAGNOSIS — R634 Abnormal weight loss: Secondary | ICD-10-CM | POA: Diagnosis not present

## 2020-02-17 DIAGNOSIS — F0280 Dementia in other diseases classified elsewhere without behavioral disturbance: Secondary | ICD-10-CM | POA: Diagnosis not present

## 2020-02-17 DIAGNOSIS — N184 Chronic kidney disease, stage 4 (severe): Secondary | ICD-10-CM | POA: Diagnosis not present

## 2020-02-21 ENCOUNTER — Telehealth: Payer: Self-pay | Admitting: Neurology

## 2020-02-21 DIAGNOSIS — N184 Chronic kidney disease, stage 4 (severe): Secondary | ICD-10-CM | POA: Diagnosis not present

## 2020-02-21 DIAGNOSIS — I129 Hypertensive chronic kidney disease with stage 1 through stage 4 chronic kidney disease, or unspecified chronic kidney disease: Secondary | ICD-10-CM | POA: Diagnosis not present

## 2020-02-21 DIAGNOSIS — R634 Abnormal weight loss: Secondary | ICD-10-CM | POA: Diagnosis not present

## 2020-02-21 DIAGNOSIS — G3183 Dementia with Lewy bodies: Secondary | ICD-10-CM | POA: Diagnosis not present

## 2020-02-21 DIAGNOSIS — F0280 Dementia in other diseases classified elsewhere without behavioral disturbance: Secondary | ICD-10-CM | POA: Diagnosis not present

## 2020-02-21 DIAGNOSIS — I2510 Atherosclerotic heart disease of native coronary artery without angina pectoris: Secondary | ICD-10-CM | POA: Diagnosis not present

## 2020-02-21 MED ORDER — RIVASTIGMINE 13.3 MG/24HR TD PT24: 13.3000 mg | MEDICATED_PATCH | Freq: Every day | TRANSDERMAL | 0 refills | Status: DC

## 2020-02-21 NOTE — Telephone Encounter (Signed)
Pt daughter called no answer left a voice mail per DPR letting her know that script was sent to [facility]

## 2020-02-21 NOTE — Telephone Encounter (Signed)
Patient's daughter called in stating that hospice nurses have been coming into the patient's home and they did not realize that the patient is out of her rivastigmine patch. She is wondering if we might have one in the office they could come get quickly? They normally get them through the [facility], but that takes several weeks, so they would like to get a prescription at [facility] so they can get some quickly until they can get it through [facility].

## 2020-02-21 NOTE — Telephone Encounter (Signed)
This drug isn't sampled.  You can send a RX to [facility] if they would like

## 2020-02-22 ENCOUNTER — Telehealth: Payer: Self-pay | Admitting: Neurology

## 2020-02-22 DIAGNOSIS — M1990 Unspecified osteoarthritis, unspecified site: Secondary | ICD-10-CM | POA: Diagnosis not present

## 2020-02-22 DIAGNOSIS — E1140 Type 2 diabetes mellitus with diabetic neuropathy, unspecified: Secondary | ICD-10-CM | POA: Diagnosis not present

## 2020-02-22 DIAGNOSIS — N184 Chronic kidney disease, stage 4 (severe): Secondary | ICD-10-CM | POA: Diagnosis not present

## 2020-02-22 DIAGNOSIS — M4800 Spinal stenosis, site unspecified: Secondary | ICD-10-CM | POA: Diagnosis not present

## 2020-02-22 DIAGNOSIS — H3530 Unspecified macular degeneration: Secondary | ICD-10-CM | POA: Diagnosis not present

## 2020-02-22 DIAGNOSIS — F0280 Dementia in other diseases classified elsewhere without behavioral disturbance: Secondary | ICD-10-CM | POA: Diagnosis not present

## 2020-02-22 DIAGNOSIS — I129 Hypertensive chronic kidney disease with stage 1 through stage 4 chronic kidney disease, or unspecified chronic kidney disease: Secondary | ICD-10-CM | POA: Diagnosis not present

## 2020-02-22 DIAGNOSIS — G3183 Dementia with Lewy bodies: Secondary | ICD-10-CM | POA: Diagnosis not present

## 2020-02-22 DIAGNOSIS — R410 Disorientation, unspecified: Secondary | ICD-10-CM | POA: Diagnosis not present

## 2020-02-22 DIAGNOSIS — I2510 Atherosclerotic heart disease of native coronary artery without angina pectoris: Secondary | ICD-10-CM | POA: Diagnosis not present

## 2020-02-22 DIAGNOSIS — R634 Abnormal weight loss: Secondary | ICD-10-CM | POA: Diagnosis not present

## 2020-02-22 DIAGNOSIS — I6523 Occlusion and stenosis of bilateral carotid arteries: Secondary | ICD-10-CM | POA: Diagnosis not present

## 2020-02-22 DIAGNOSIS — M549 Dorsalgia, unspecified: Secondary | ICD-10-CM | POA: Diagnosis not present

## 2020-02-22 MED ORDER — RIVASTIGMINE 13.3 MG/24HR TD PT24: 13.3000 mg | MEDICATED_PATCH | Freq: Every day | TRANSDERMAL | 1 refills | Status: DC

## 2020-02-22 NOTE — Telephone Encounter (Signed)
Patient's daughter called in wanting to double check that the prescription was also sent to the [facility] for the rivastigmine patch? They did already pick up the ones sent to [facility].

## 2020-02-22 NOTE — Telephone Encounter (Signed)
Rx(s) sent to pharmacy electronically.  Patients daughter directly notified and voiced understanding.

## 2020-02-24 DIAGNOSIS — I2510 Atherosclerotic heart disease of native coronary artery without angina pectoris: Secondary | ICD-10-CM | POA: Diagnosis not present

## 2020-02-24 DIAGNOSIS — G3183 Dementia with Lewy bodies: Secondary | ICD-10-CM | POA: Diagnosis not present

## 2020-02-24 DIAGNOSIS — I129 Hypertensive chronic kidney disease with stage 1 through stage 4 chronic kidney disease, or unspecified chronic kidney disease: Secondary | ICD-10-CM | POA: Diagnosis not present

## 2020-02-24 DIAGNOSIS — F0280 Dementia in other diseases classified elsewhere without behavioral disturbance: Secondary | ICD-10-CM | POA: Diagnosis not present

## 2020-02-24 DIAGNOSIS — R634 Abnormal weight loss: Secondary | ICD-10-CM | POA: Diagnosis not present

## 2020-02-24 DIAGNOSIS — N184 Chronic kidney disease, stage 4 (severe): Secondary | ICD-10-CM | POA: Diagnosis not present

## 2020-02-28 DIAGNOSIS — F0280 Dementia in other diseases classified elsewhere without behavioral disturbance: Secondary | ICD-10-CM | POA: Diagnosis not present

## 2020-02-28 DIAGNOSIS — I2510 Atherosclerotic heart disease of native coronary artery without angina pectoris: Secondary | ICD-10-CM | POA: Diagnosis not present

## 2020-02-28 DIAGNOSIS — I129 Hypertensive chronic kidney disease with stage 1 through stage 4 chronic kidney disease, or unspecified chronic kidney disease: Secondary | ICD-10-CM | POA: Diagnosis not present

## 2020-02-28 DIAGNOSIS — N184 Chronic kidney disease, stage 4 (severe): Secondary | ICD-10-CM | POA: Diagnosis not present

## 2020-02-28 DIAGNOSIS — G3183 Dementia with Lewy bodies: Secondary | ICD-10-CM | POA: Diagnosis not present

## 2020-02-28 DIAGNOSIS — R634 Abnormal weight loss: Secondary | ICD-10-CM | POA: Diagnosis not present

## 2020-02-29 DIAGNOSIS — I129 Hypertensive chronic kidney disease with stage 1 through stage 4 chronic kidney disease, or unspecified chronic kidney disease: Secondary | ICD-10-CM | POA: Diagnosis not present

## 2020-02-29 DIAGNOSIS — R634 Abnormal weight loss: Secondary | ICD-10-CM | POA: Diagnosis not present

## 2020-02-29 DIAGNOSIS — N184 Chronic kidney disease, stage 4 (severe): Secondary | ICD-10-CM | POA: Diagnosis not present

## 2020-02-29 DIAGNOSIS — G3183 Dementia with Lewy bodies: Secondary | ICD-10-CM | POA: Diagnosis not present

## 2020-02-29 DIAGNOSIS — F0280 Dementia in other diseases classified elsewhere without behavioral disturbance: Secondary | ICD-10-CM | POA: Diagnosis not present

## 2020-02-29 DIAGNOSIS — I2510 Atherosclerotic heart disease of native coronary artery without angina pectoris: Secondary | ICD-10-CM | POA: Diagnosis not present

## 2020-03-02 DIAGNOSIS — I129 Hypertensive chronic kidney disease with stage 1 through stage 4 chronic kidney disease, or unspecified chronic kidney disease: Secondary | ICD-10-CM | POA: Diagnosis not present

## 2020-03-02 DIAGNOSIS — F0280 Dementia in other diseases classified elsewhere without behavioral disturbance: Secondary | ICD-10-CM | POA: Diagnosis not present

## 2020-03-02 DIAGNOSIS — I2510 Atherosclerotic heart disease of native coronary artery without angina pectoris: Secondary | ICD-10-CM | POA: Diagnosis not present

## 2020-03-02 DIAGNOSIS — G3183 Dementia with Lewy bodies: Secondary | ICD-10-CM | POA: Diagnosis not present

## 2020-03-02 DIAGNOSIS — N184 Chronic kidney disease, stage 4 (severe): Secondary | ICD-10-CM | POA: Diagnosis not present

## 2020-03-02 DIAGNOSIS — R634 Abnormal weight loss: Secondary | ICD-10-CM | POA: Diagnosis not present

## 2020-03-06 DIAGNOSIS — G3183 Dementia with Lewy bodies: Secondary | ICD-10-CM | POA: Diagnosis not present

## 2020-03-06 DIAGNOSIS — I2510 Atherosclerotic heart disease of native coronary artery without angina pectoris: Secondary | ICD-10-CM | POA: Diagnosis not present

## 2020-03-06 DIAGNOSIS — I129 Hypertensive chronic kidney disease with stage 1 through stage 4 chronic kidney disease, or unspecified chronic kidney disease: Secondary | ICD-10-CM | POA: Diagnosis not present

## 2020-03-06 DIAGNOSIS — F0280 Dementia in other diseases classified elsewhere without behavioral disturbance: Secondary | ICD-10-CM | POA: Diagnosis not present

## 2020-03-06 DIAGNOSIS — R634 Abnormal weight loss: Secondary | ICD-10-CM | POA: Diagnosis not present

## 2020-03-06 DIAGNOSIS — N184 Chronic kidney disease, stage 4 (severe): Secondary | ICD-10-CM | POA: Diagnosis not present

## 2020-03-07 DIAGNOSIS — N184 Chronic kidney disease, stage 4 (severe): Secondary | ICD-10-CM | POA: Diagnosis not present

## 2020-03-07 DIAGNOSIS — F0280 Dementia in other diseases classified elsewhere without behavioral disturbance: Secondary | ICD-10-CM | POA: Diagnosis not present

## 2020-03-07 DIAGNOSIS — G3183 Dementia with Lewy bodies: Secondary | ICD-10-CM | POA: Diagnosis not present

## 2020-03-07 DIAGNOSIS — R634 Abnormal weight loss: Secondary | ICD-10-CM | POA: Diagnosis not present

## 2020-03-07 DIAGNOSIS — I2510 Atherosclerotic heart disease of native coronary artery without angina pectoris: Secondary | ICD-10-CM | POA: Diagnosis not present

## 2020-03-07 DIAGNOSIS — I129 Hypertensive chronic kidney disease with stage 1 through stage 4 chronic kidney disease, or unspecified chronic kidney disease: Secondary | ICD-10-CM | POA: Diagnosis not present

## 2020-03-09 DIAGNOSIS — I2510 Atherosclerotic heart disease of native coronary artery without angina pectoris: Secondary | ICD-10-CM | POA: Diagnosis not present

## 2020-03-09 DIAGNOSIS — G3183 Dementia with Lewy bodies: Secondary | ICD-10-CM | POA: Diagnosis not present

## 2020-03-09 DIAGNOSIS — I129 Hypertensive chronic kidney disease with stage 1 through stage 4 chronic kidney disease, or unspecified chronic kidney disease: Secondary | ICD-10-CM | POA: Diagnosis not present

## 2020-03-09 DIAGNOSIS — F0280 Dementia in other diseases classified elsewhere without behavioral disturbance: Secondary | ICD-10-CM | POA: Diagnosis not present

## 2020-03-09 DIAGNOSIS — N184 Chronic kidney disease, stage 4 (severe): Secondary | ICD-10-CM | POA: Diagnosis not present

## 2020-03-09 DIAGNOSIS — R634 Abnormal weight loss: Secondary | ICD-10-CM | POA: Diagnosis not present

## 2020-03-12 DIAGNOSIS — N184 Chronic kidney disease, stage 4 (severe): Secondary | ICD-10-CM | POA: Diagnosis not present

## 2020-03-12 DIAGNOSIS — F5104 Psychophysiologic insomnia: Secondary | ICD-10-CM | POA: Diagnosis not present

## 2020-03-12 DIAGNOSIS — F0390 Unspecified dementia without behavioral disturbance: Secondary | ICD-10-CM | POA: Diagnosis not present

## 2020-03-12 DIAGNOSIS — F0280 Dementia in other diseases classified elsewhere without behavioral disturbance: Secondary | ICD-10-CM | POA: Diagnosis not present

## 2020-03-12 DIAGNOSIS — R634 Abnormal weight loss: Secondary | ICD-10-CM | POA: Diagnosis not present

## 2020-03-12 DIAGNOSIS — G894 Chronic pain syndrome: Secondary | ICD-10-CM | POA: Diagnosis not present

## 2020-03-12 DIAGNOSIS — I129 Hypertensive chronic kidney disease with stage 1 through stage 4 chronic kidney disease, or unspecified chronic kidney disease: Secondary | ICD-10-CM | POA: Diagnosis not present

## 2020-03-12 DIAGNOSIS — I2510 Atherosclerotic heart disease of native coronary artery without angina pectoris: Secondary | ICD-10-CM | POA: Diagnosis not present

## 2020-03-12 DIAGNOSIS — G3183 Dementia with Lewy bodies: Secondary | ICD-10-CM | POA: Diagnosis not present

## 2020-03-12 DIAGNOSIS — I10 Essential (primary) hypertension: Secondary | ICD-10-CM | POA: Diagnosis not present

## 2020-03-12 DIAGNOSIS — Z7401 Bed confinement status: Secondary | ICD-10-CM | POA: Diagnosis not present

## 2020-03-13 DIAGNOSIS — F0280 Dementia in other diseases classified elsewhere without behavioral disturbance: Secondary | ICD-10-CM | POA: Diagnosis not present

## 2020-03-13 DIAGNOSIS — I2510 Atherosclerotic heart disease of native coronary artery without angina pectoris: Secondary | ICD-10-CM | POA: Diagnosis not present

## 2020-03-13 DIAGNOSIS — I129 Hypertensive chronic kidney disease with stage 1 through stage 4 chronic kidney disease, or unspecified chronic kidney disease: Secondary | ICD-10-CM | POA: Diagnosis not present

## 2020-03-13 DIAGNOSIS — R634 Abnormal weight loss: Secondary | ICD-10-CM | POA: Diagnosis not present

## 2020-03-13 DIAGNOSIS — G3183 Dementia with Lewy bodies: Secondary | ICD-10-CM | POA: Diagnosis not present

## 2020-03-13 DIAGNOSIS — N184 Chronic kidney disease, stage 4 (severe): Secondary | ICD-10-CM | POA: Diagnosis not present

## 2020-03-14 DIAGNOSIS — I129 Hypertensive chronic kidney disease with stage 1 through stage 4 chronic kidney disease, or unspecified chronic kidney disease: Secondary | ICD-10-CM | POA: Diagnosis not present

## 2020-03-14 DIAGNOSIS — G3183 Dementia with Lewy bodies: Secondary | ICD-10-CM | POA: Diagnosis not present

## 2020-03-14 DIAGNOSIS — R634 Abnormal weight loss: Secondary | ICD-10-CM | POA: Diagnosis not present

## 2020-03-14 DIAGNOSIS — N184 Chronic kidney disease, stage 4 (severe): Secondary | ICD-10-CM | POA: Diagnosis not present

## 2020-03-14 DIAGNOSIS — F0280 Dementia in other diseases classified elsewhere without behavioral disturbance: Secondary | ICD-10-CM | POA: Diagnosis not present

## 2020-03-14 DIAGNOSIS — I2510 Atherosclerotic heart disease of native coronary artery without angina pectoris: Secondary | ICD-10-CM | POA: Diagnosis not present

## 2020-03-20 DIAGNOSIS — I2510 Atherosclerotic heart disease of native coronary artery without angina pectoris: Secondary | ICD-10-CM | POA: Diagnosis not present

## 2020-03-20 DIAGNOSIS — G3183 Dementia with Lewy bodies: Secondary | ICD-10-CM | POA: Diagnosis not present

## 2020-03-20 DIAGNOSIS — R634 Abnormal weight loss: Secondary | ICD-10-CM | POA: Diagnosis not present

## 2020-03-20 DIAGNOSIS — N184 Chronic kidney disease, stage 4 (severe): Secondary | ICD-10-CM | POA: Diagnosis not present

## 2020-03-20 DIAGNOSIS — I129 Hypertensive chronic kidney disease with stage 1 through stage 4 chronic kidney disease, or unspecified chronic kidney disease: Secondary | ICD-10-CM | POA: Diagnosis not present

## 2020-03-20 DIAGNOSIS — F0280 Dementia in other diseases classified elsewhere without behavioral disturbance: Secondary | ICD-10-CM | POA: Diagnosis not present

## 2020-03-22 ENCOUNTER — Other Ambulatory Visit: Payer: Self-pay | Admitting: Neurology

## 2020-03-22 DIAGNOSIS — I129 Hypertensive chronic kidney disease with stage 1 through stage 4 chronic kidney disease, or unspecified chronic kidney disease: Secondary | ICD-10-CM | POA: Diagnosis not present

## 2020-03-22 DIAGNOSIS — I2510 Atherosclerotic heart disease of native coronary artery without angina pectoris: Secondary | ICD-10-CM | POA: Diagnosis not present

## 2020-03-22 DIAGNOSIS — N184 Chronic kidney disease, stage 4 (severe): Secondary | ICD-10-CM | POA: Diagnosis not present

## 2020-03-22 DIAGNOSIS — G3183 Dementia with Lewy bodies: Secondary | ICD-10-CM | POA: Diagnosis not present

## 2020-03-22 DIAGNOSIS — R634 Abnormal weight loss: Secondary | ICD-10-CM | POA: Diagnosis not present

## 2020-03-22 DIAGNOSIS — F0280 Dementia in other diseases classified elsewhere without behavioral disturbance: Secondary | ICD-10-CM | POA: Diagnosis not present

## 2020-03-24 DIAGNOSIS — M549 Dorsalgia, unspecified: Secondary | ICD-10-CM | POA: Diagnosis not present

## 2020-03-24 DIAGNOSIS — I2510 Atherosclerotic heart disease of native coronary artery without angina pectoris: Secondary | ICD-10-CM | POA: Diagnosis not present

## 2020-03-24 DIAGNOSIS — N184 Chronic kidney disease, stage 4 (severe): Secondary | ICD-10-CM | POA: Diagnosis not present

## 2020-03-24 DIAGNOSIS — M4800 Spinal stenosis, site unspecified: Secondary | ICD-10-CM | POA: Diagnosis not present

## 2020-03-24 DIAGNOSIS — I6523 Occlusion and stenosis of bilateral carotid arteries: Secondary | ICD-10-CM | POA: Diagnosis not present

## 2020-03-24 DIAGNOSIS — M1990 Unspecified osteoarthritis, unspecified site: Secondary | ICD-10-CM | POA: Diagnosis not present

## 2020-03-24 DIAGNOSIS — F0280 Dementia in other diseases classified elsewhere without behavioral disturbance: Secondary | ICD-10-CM | POA: Diagnosis not present

## 2020-03-24 DIAGNOSIS — H3530 Unspecified macular degeneration: Secondary | ICD-10-CM | POA: Diagnosis not present

## 2020-03-24 DIAGNOSIS — G3183 Dementia with Lewy bodies: Secondary | ICD-10-CM | POA: Diagnosis not present

## 2020-03-24 DIAGNOSIS — E1140 Type 2 diabetes mellitus with diabetic neuropathy, unspecified: Secondary | ICD-10-CM | POA: Diagnosis not present

## 2020-03-24 DIAGNOSIS — R410 Disorientation, unspecified: Secondary | ICD-10-CM | POA: Diagnosis not present

## 2020-03-24 DIAGNOSIS — I129 Hypertensive chronic kidney disease with stage 1 through stage 4 chronic kidney disease, or unspecified chronic kidney disease: Secondary | ICD-10-CM | POA: Diagnosis not present

## 2020-03-24 DIAGNOSIS — R634 Abnormal weight loss: Secondary | ICD-10-CM | POA: Diagnosis not present

## 2020-03-27 DIAGNOSIS — F0280 Dementia in other diseases classified elsewhere without behavioral disturbance: Secondary | ICD-10-CM | POA: Diagnosis not present

## 2020-03-27 DIAGNOSIS — G3183 Dementia with Lewy bodies: Secondary | ICD-10-CM | POA: Diagnosis not present

## 2020-03-27 DIAGNOSIS — R634 Abnormal weight loss: Secondary | ICD-10-CM | POA: Diagnosis not present

## 2020-03-27 DIAGNOSIS — N184 Chronic kidney disease, stage 4 (severe): Secondary | ICD-10-CM | POA: Diagnosis not present

## 2020-03-27 DIAGNOSIS — I129 Hypertensive chronic kidney disease with stage 1 through stage 4 chronic kidney disease, or unspecified chronic kidney disease: Secondary | ICD-10-CM | POA: Diagnosis not present

## 2020-03-27 DIAGNOSIS — I2510 Atherosclerotic heart disease of native coronary artery without angina pectoris: Secondary | ICD-10-CM | POA: Diagnosis not present

## 2020-03-29 DIAGNOSIS — N184 Chronic kidney disease, stage 4 (severe): Secondary | ICD-10-CM | POA: Diagnosis not present

## 2020-03-29 DIAGNOSIS — R634 Abnormal weight loss: Secondary | ICD-10-CM | POA: Diagnosis not present

## 2020-03-29 DIAGNOSIS — I129 Hypertensive chronic kidney disease with stage 1 through stage 4 chronic kidney disease, or unspecified chronic kidney disease: Secondary | ICD-10-CM | POA: Diagnosis not present

## 2020-03-29 DIAGNOSIS — I2510 Atherosclerotic heart disease of native coronary artery without angina pectoris: Secondary | ICD-10-CM | POA: Diagnosis not present

## 2020-03-29 DIAGNOSIS — F0280 Dementia in other diseases classified elsewhere without behavioral disturbance: Secondary | ICD-10-CM | POA: Diagnosis not present

## 2020-03-29 DIAGNOSIS — G3183 Dementia with Lewy bodies: Secondary | ICD-10-CM | POA: Diagnosis not present

## 2020-03-30 DIAGNOSIS — N184 Chronic kidney disease, stage 4 (severe): Secondary | ICD-10-CM | POA: Diagnosis not present

## 2020-03-30 DIAGNOSIS — F0280 Dementia in other diseases classified elsewhere without behavioral disturbance: Secondary | ICD-10-CM | POA: Diagnosis not present

## 2020-03-30 DIAGNOSIS — G3183 Dementia with Lewy bodies: Secondary | ICD-10-CM | POA: Diagnosis not present

## 2020-03-30 DIAGNOSIS — I129 Hypertensive chronic kidney disease with stage 1 through stage 4 chronic kidney disease, or unspecified chronic kidney disease: Secondary | ICD-10-CM | POA: Diagnosis not present

## 2020-03-30 DIAGNOSIS — R634 Abnormal weight loss: Secondary | ICD-10-CM | POA: Diagnosis not present

## 2020-03-30 DIAGNOSIS — I2510 Atherosclerotic heart disease of native coronary artery without angina pectoris: Secondary | ICD-10-CM | POA: Diagnosis not present

## 2020-04-03 DIAGNOSIS — R634 Abnormal weight loss: Secondary | ICD-10-CM | POA: Diagnosis not present

## 2020-04-03 DIAGNOSIS — G3183 Dementia with Lewy bodies: Secondary | ICD-10-CM | POA: Diagnosis not present

## 2020-04-03 DIAGNOSIS — I2510 Atherosclerotic heart disease of native coronary artery without angina pectoris: Secondary | ICD-10-CM | POA: Diagnosis not present

## 2020-04-03 DIAGNOSIS — N184 Chronic kidney disease, stage 4 (severe): Secondary | ICD-10-CM | POA: Diagnosis not present

## 2020-04-03 DIAGNOSIS — I129 Hypertensive chronic kidney disease with stage 1 through stage 4 chronic kidney disease, or unspecified chronic kidney disease: Secondary | ICD-10-CM | POA: Diagnosis not present

## 2020-04-03 DIAGNOSIS — F0280 Dementia in other diseases classified elsewhere without behavioral disturbance: Secondary | ICD-10-CM | POA: Diagnosis not present

## 2020-04-04 DIAGNOSIS — I2510 Atherosclerotic heart disease of native coronary artery without angina pectoris: Secondary | ICD-10-CM | POA: Diagnosis not present

## 2020-04-04 DIAGNOSIS — G3183 Dementia with Lewy bodies: Secondary | ICD-10-CM | POA: Diagnosis not present

## 2020-04-04 DIAGNOSIS — R634 Abnormal weight loss: Secondary | ICD-10-CM | POA: Diagnosis not present

## 2020-04-04 DIAGNOSIS — I129 Hypertensive chronic kidney disease with stage 1 through stage 4 chronic kidney disease, or unspecified chronic kidney disease: Secondary | ICD-10-CM | POA: Diagnosis not present

## 2020-04-04 DIAGNOSIS — F0280 Dementia in other diseases classified elsewhere without behavioral disturbance: Secondary | ICD-10-CM | POA: Diagnosis not present

## 2020-04-04 DIAGNOSIS — N184 Chronic kidney disease, stage 4 (severe): Secondary | ICD-10-CM | POA: Diagnosis not present

## 2020-04-06 DIAGNOSIS — R634 Abnormal weight loss: Secondary | ICD-10-CM | POA: Diagnosis not present

## 2020-04-06 DIAGNOSIS — I129 Hypertensive chronic kidney disease with stage 1 through stage 4 chronic kidney disease, or unspecified chronic kidney disease: Secondary | ICD-10-CM | POA: Diagnosis not present

## 2020-04-06 DIAGNOSIS — N184 Chronic kidney disease, stage 4 (severe): Secondary | ICD-10-CM | POA: Diagnosis not present

## 2020-04-06 DIAGNOSIS — F0280 Dementia in other diseases classified elsewhere without behavioral disturbance: Secondary | ICD-10-CM | POA: Diagnosis not present

## 2020-04-06 DIAGNOSIS — G3183 Dementia with Lewy bodies: Secondary | ICD-10-CM | POA: Diagnosis not present

## 2020-04-06 DIAGNOSIS — I2510 Atherosclerotic heart disease of native coronary artery without angina pectoris: Secondary | ICD-10-CM | POA: Diagnosis not present

## 2020-04-11 DIAGNOSIS — N184 Chronic kidney disease, stage 4 (severe): Secondary | ICD-10-CM | POA: Diagnosis not present

## 2020-04-11 DIAGNOSIS — I129 Hypertensive chronic kidney disease with stage 1 through stage 4 chronic kidney disease, or unspecified chronic kidney disease: Secondary | ICD-10-CM | POA: Diagnosis not present

## 2020-04-11 DIAGNOSIS — G3183 Dementia with Lewy bodies: Secondary | ICD-10-CM | POA: Diagnosis not present

## 2020-04-11 DIAGNOSIS — I2510 Atherosclerotic heart disease of native coronary artery without angina pectoris: Secondary | ICD-10-CM | POA: Diagnosis not present

## 2020-04-11 DIAGNOSIS — R634 Abnormal weight loss: Secondary | ICD-10-CM | POA: Diagnosis not present

## 2020-04-11 DIAGNOSIS — F0280 Dementia in other diseases classified elsewhere without behavioral disturbance: Secondary | ICD-10-CM | POA: Diagnosis not present

## 2020-04-13 DIAGNOSIS — G3183 Dementia with Lewy bodies: Secondary | ICD-10-CM | POA: Diagnosis not present

## 2020-04-13 DIAGNOSIS — R634 Abnormal weight loss: Secondary | ICD-10-CM | POA: Diagnosis not present

## 2020-04-13 DIAGNOSIS — I129 Hypertensive chronic kidney disease with stage 1 through stage 4 chronic kidney disease, or unspecified chronic kidney disease: Secondary | ICD-10-CM | POA: Diagnosis not present

## 2020-04-13 DIAGNOSIS — N184 Chronic kidney disease, stage 4 (severe): Secondary | ICD-10-CM | POA: Diagnosis not present

## 2020-04-13 DIAGNOSIS — I2510 Atherosclerotic heart disease of native coronary artery without angina pectoris: Secondary | ICD-10-CM | POA: Diagnosis not present

## 2020-04-13 DIAGNOSIS — F0280 Dementia in other diseases classified elsewhere without behavioral disturbance: Secondary | ICD-10-CM | POA: Diagnosis not present

## 2020-04-17 DIAGNOSIS — R634 Abnormal weight loss: Secondary | ICD-10-CM | POA: Diagnosis not present

## 2020-04-17 DIAGNOSIS — I129 Hypertensive chronic kidney disease with stage 1 through stage 4 chronic kidney disease, or unspecified chronic kidney disease: Secondary | ICD-10-CM | POA: Diagnosis not present

## 2020-04-17 DIAGNOSIS — N184 Chronic kidney disease, stage 4 (severe): Secondary | ICD-10-CM | POA: Diagnosis not present

## 2020-04-17 DIAGNOSIS — I2510 Atherosclerotic heart disease of native coronary artery without angina pectoris: Secondary | ICD-10-CM | POA: Diagnosis not present

## 2020-04-17 DIAGNOSIS — F0280 Dementia in other diseases classified elsewhere without behavioral disturbance: Secondary | ICD-10-CM | POA: Diagnosis not present

## 2020-04-17 DIAGNOSIS — G3183 Dementia with Lewy bodies: Secondary | ICD-10-CM | POA: Diagnosis not present

## 2020-04-19 DIAGNOSIS — G3183 Dementia with Lewy bodies: Secondary | ICD-10-CM | POA: Diagnosis not present

## 2020-04-19 DIAGNOSIS — I129 Hypertensive chronic kidney disease with stage 1 through stage 4 chronic kidney disease, or unspecified chronic kidney disease: Secondary | ICD-10-CM | POA: Diagnosis not present

## 2020-04-19 DIAGNOSIS — F0280 Dementia in other diseases classified elsewhere without behavioral disturbance: Secondary | ICD-10-CM | POA: Diagnosis not present

## 2020-04-19 DIAGNOSIS — N184 Chronic kidney disease, stage 4 (severe): Secondary | ICD-10-CM | POA: Diagnosis not present

## 2020-04-19 DIAGNOSIS — I2510 Atherosclerotic heart disease of native coronary artery without angina pectoris: Secondary | ICD-10-CM | POA: Diagnosis not present

## 2020-04-19 DIAGNOSIS — R634 Abnormal weight loss: Secondary | ICD-10-CM | POA: Diagnosis not present

## 2020-04-20 DIAGNOSIS — R634 Abnormal weight loss: Secondary | ICD-10-CM | POA: Diagnosis not present

## 2020-04-20 DIAGNOSIS — N184 Chronic kidney disease, stage 4 (severe): Secondary | ICD-10-CM | POA: Diagnosis not present

## 2020-04-20 DIAGNOSIS — I129 Hypertensive chronic kidney disease with stage 1 through stage 4 chronic kidney disease, or unspecified chronic kidney disease: Secondary | ICD-10-CM | POA: Diagnosis not present

## 2020-04-20 DIAGNOSIS — F0280 Dementia in other diseases classified elsewhere without behavioral disturbance: Secondary | ICD-10-CM | POA: Diagnosis not present

## 2020-04-20 DIAGNOSIS — I2510 Atherosclerotic heart disease of native coronary artery without angina pectoris: Secondary | ICD-10-CM | POA: Diagnosis not present

## 2020-04-20 DIAGNOSIS — G3183 Dementia with Lewy bodies: Secondary | ICD-10-CM | POA: Diagnosis not present

## 2020-04-24 DIAGNOSIS — N184 Chronic kidney disease, stage 4 (severe): Secondary | ICD-10-CM | POA: Diagnosis not present

## 2020-04-24 DIAGNOSIS — R410 Disorientation, unspecified: Secondary | ICD-10-CM | POA: Diagnosis not present

## 2020-04-24 DIAGNOSIS — M1990 Unspecified osteoarthritis, unspecified site: Secondary | ICD-10-CM | POA: Diagnosis not present

## 2020-04-24 DIAGNOSIS — I6523 Occlusion and stenosis of bilateral carotid arteries: Secondary | ICD-10-CM | POA: Diagnosis not present

## 2020-04-24 DIAGNOSIS — E1140 Type 2 diabetes mellitus with diabetic neuropathy, unspecified: Secondary | ICD-10-CM | POA: Diagnosis not present

## 2020-04-24 DIAGNOSIS — R634 Abnormal weight loss: Secondary | ICD-10-CM | POA: Diagnosis not present

## 2020-04-24 DIAGNOSIS — M4800 Spinal stenosis, site unspecified: Secondary | ICD-10-CM | POA: Diagnosis not present

## 2020-04-24 DIAGNOSIS — F0280 Dementia in other diseases classified elsewhere without behavioral disturbance: Secondary | ICD-10-CM | POA: Diagnosis not present

## 2020-04-24 DIAGNOSIS — I2510 Atherosclerotic heart disease of native coronary artery without angina pectoris: Secondary | ICD-10-CM | POA: Diagnosis not present

## 2020-04-24 DIAGNOSIS — G3183 Dementia with Lewy bodies: Secondary | ICD-10-CM | POA: Diagnosis not present

## 2020-04-24 DIAGNOSIS — M549 Dorsalgia, unspecified: Secondary | ICD-10-CM | POA: Diagnosis not present

## 2020-04-24 DIAGNOSIS — I129 Hypertensive chronic kidney disease with stage 1 through stage 4 chronic kidney disease, or unspecified chronic kidney disease: Secondary | ICD-10-CM | POA: Diagnosis not present

## 2020-04-24 DIAGNOSIS — H3530 Unspecified macular degeneration: Secondary | ICD-10-CM | POA: Diagnosis not present

## 2020-04-25 DIAGNOSIS — I129 Hypertensive chronic kidney disease with stage 1 through stage 4 chronic kidney disease, or unspecified chronic kidney disease: Secondary | ICD-10-CM | POA: Diagnosis not present

## 2020-04-25 DIAGNOSIS — I2510 Atherosclerotic heart disease of native coronary artery without angina pectoris: Secondary | ICD-10-CM | POA: Diagnosis not present

## 2020-04-25 DIAGNOSIS — F0280 Dementia in other diseases classified elsewhere without behavioral disturbance: Secondary | ICD-10-CM | POA: Diagnosis not present

## 2020-04-25 DIAGNOSIS — R634 Abnormal weight loss: Secondary | ICD-10-CM | POA: Diagnosis not present

## 2020-04-25 DIAGNOSIS — G3183 Dementia with Lewy bodies: Secondary | ICD-10-CM | POA: Diagnosis not present

## 2020-04-25 DIAGNOSIS — N184 Chronic kidney disease, stage 4 (severe): Secondary | ICD-10-CM | POA: Diagnosis not present

## 2020-04-26 ENCOUNTER — Ambulatory Visit: Payer: Medicare Other | Admitting: Neurology

## 2020-04-27 DIAGNOSIS — F0280 Dementia in other diseases classified elsewhere without behavioral disturbance: Secondary | ICD-10-CM | POA: Diagnosis not present

## 2020-04-27 DIAGNOSIS — I2510 Atherosclerotic heart disease of native coronary artery without angina pectoris: Secondary | ICD-10-CM | POA: Diagnosis not present

## 2020-04-27 DIAGNOSIS — I129 Hypertensive chronic kidney disease with stage 1 through stage 4 chronic kidney disease, or unspecified chronic kidney disease: Secondary | ICD-10-CM | POA: Diagnosis not present

## 2020-04-27 DIAGNOSIS — N184 Chronic kidney disease, stage 4 (severe): Secondary | ICD-10-CM | POA: Diagnosis not present

## 2020-04-27 DIAGNOSIS — G3183 Dementia with Lewy bodies: Secondary | ICD-10-CM | POA: Diagnosis not present

## 2020-04-27 DIAGNOSIS — R634 Abnormal weight loss: Secondary | ICD-10-CM | POA: Diagnosis not present

## 2020-04-29 DIAGNOSIS — N184 Chronic kidney disease, stage 4 (severe): Secondary | ICD-10-CM | POA: Diagnosis not present

## 2020-04-29 DIAGNOSIS — I2510 Atherosclerotic heart disease of native coronary artery without angina pectoris: Secondary | ICD-10-CM | POA: Diagnosis not present

## 2020-04-29 DIAGNOSIS — R634 Abnormal weight loss: Secondary | ICD-10-CM | POA: Diagnosis not present

## 2020-04-29 DIAGNOSIS — I129 Hypertensive chronic kidney disease with stage 1 through stage 4 chronic kidney disease, or unspecified chronic kidney disease: Secondary | ICD-10-CM | POA: Diagnosis not present

## 2020-04-29 DIAGNOSIS — F0280 Dementia in other diseases classified elsewhere without behavioral disturbance: Secondary | ICD-10-CM | POA: Diagnosis not present

## 2020-04-29 DIAGNOSIS — G3183 Dementia with Lewy bodies: Secondary | ICD-10-CM | POA: Diagnosis not present

## 2020-05-02 DIAGNOSIS — R634 Abnormal weight loss: Secondary | ICD-10-CM | POA: Diagnosis not present

## 2020-05-02 DIAGNOSIS — I129 Hypertensive chronic kidney disease with stage 1 through stage 4 chronic kidney disease, or unspecified chronic kidney disease: Secondary | ICD-10-CM | POA: Diagnosis not present

## 2020-05-02 DIAGNOSIS — F0280 Dementia in other diseases classified elsewhere without behavioral disturbance: Secondary | ICD-10-CM | POA: Diagnosis not present

## 2020-05-02 DIAGNOSIS — I2510 Atherosclerotic heart disease of native coronary artery without angina pectoris: Secondary | ICD-10-CM | POA: Diagnosis not present

## 2020-05-02 DIAGNOSIS — N184 Chronic kidney disease, stage 4 (severe): Secondary | ICD-10-CM | POA: Diagnosis not present

## 2020-05-02 DIAGNOSIS — G3183 Dementia with Lewy bodies: Secondary | ICD-10-CM | POA: Diagnosis not present

## 2020-05-04 DIAGNOSIS — I2510 Atherosclerotic heart disease of native coronary artery without angina pectoris: Secondary | ICD-10-CM | POA: Diagnosis not present

## 2020-05-04 DIAGNOSIS — R634 Abnormal weight loss: Secondary | ICD-10-CM | POA: Diagnosis not present

## 2020-05-04 DIAGNOSIS — F0280 Dementia in other diseases classified elsewhere without behavioral disturbance: Secondary | ICD-10-CM | POA: Diagnosis not present

## 2020-05-04 DIAGNOSIS — G3183 Dementia with Lewy bodies: Secondary | ICD-10-CM | POA: Diagnosis not present

## 2020-05-04 DIAGNOSIS — I129 Hypertensive chronic kidney disease with stage 1 through stage 4 chronic kidney disease, or unspecified chronic kidney disease: Secondary | ICD-10-CM | POA: Diagnosis not present

## 2020-05-04 DIAGNOSIS — N184 Chronic kidney disease, stage 4 (severe): Secondary | ICD-10-CM | POA: Diagnosis not present

## 2020-05-08 DIAGNOSIS — I2510 Atherosclerotic heart disease of native coronary artery without angina pectoris: Secondary | ICD-10-CM | POA: Diagnosis not present

## 2020-05-08 DIAGNOSIS — I129 Hypertensive chronic kidney disease with stage 1 through stage 4 chronic kidney disease, or unspecified chronic kidney disease: Secondary | ICD-10-CM | POA: Diagnosis not present

## 2020-05-08 DIAGNOSIS — N184 Chronic kidney disease, stage 4 (severe): Secondary | ICD-10-CM | POA: Diagnosis not present

## 2020-05-08 DIAGNOSIS — F0280 Dementia in other diseases classified elsewhere without behavioral disturbance: Secondary | ICD-10-CM | POA: Diagnosis not present

## 2020-05-08 DIAGNOSIS — R634 Abnormal weight loss: Secondary | ICD-10-CM | POA: Diagnosis not present

## 2020-05-08 DIAGNOSIS — G3183 Dementia with Lewy bodies: Secondary | ICD-10-CM | POA: Diagnosis not present

## 2020-05-09 DIAGNOSIS — F0280 Dementia in other diseases classified elsewhere without behavioral disturbance: Secondary | ICD-10-CM | POA: Diagnosis not present

## 2020-05-09 DIAGNOSIS — N184 Chronic kidney disease, stage 4 (severe): Secondary | ICD-10-CM | POA: Diagnosis not present

## 2020-05-09 DIAGNOSIS — I2510 Atherosclerotic heart disease of native coronary artery without angina pectoris: Secondary | ICD-10-CM | POA: Diagnosis not present

## 2020-05-09 DIAGNOSIS — R634 Abnormal weight loss: Secondary | ICD-10-CM | POA: Diagnosis not present

## 2020-05-09 DIAGNOSIS — I129 Hypertensive chronic kidney disease with stage 1 through stage 4 chronic kidney disease, or unspecified chronic kidney disease: Secondary | ICD-10-CM | POA: Diagnosis not present

## 2020-05-09 DIAGNOSIS — G3183 Dementia with Lewy bodies: Secondary | ICD-10-CM | POA: Diagnosis not present

## 2020-05-11 DIAGNOSIS — G3183 Dementia with Lewy bodies: Secondary | ICD-10-CM | POA: Diagnosis not present

## 2020-05-11 DIAGNOSIS — F0280 Dementia in other diseases classified elsewhere without behavioral disturbance: Secondary | ICD-10-CM | POA: Diagnosis not present

## 2020-05-11 DIAGNOSIS — I129 Hypertensive chronic kidney disease with stage 1 through stage 4 chronic kidney disease, or unspecified chronic kidney disease: Secondary | ICD-10-CM | POA: Diagnosis not present

## 2020-05-11 DIAGNOSIS — I2510 Atherosclerotic heart disease of native coronary artery without angina pectoris: Secondary | ICD-10-CM | POA: Diagnosis not present

## 2020-05-11 DIAGNOSIS — R634 Abnormal weight loss: Secondary | ICD-10-CM | POA: Diagnosis not present

## 2020-05-11 DIAGNOSIS — N184 Chronic kidney disease, stage 4 (severe): Secondary | ICD-10-CM | POA: Diagnosis not present

## 2020-05-14 DIAGNOSIS — F0390 Unspecified dementia without behavioral disturbance: Secondary | ICD-10-CM | POA: Diagnosis not present

## 2020-05-14 DIAGNOSIS — F419 Anxiety disorder, unspecified: Secondary | ICD-10-CM | POA: Diagnosis not present

## 2020-05-14 DIAGNOSIS — G894 Chronic pain syndrome: Secondary | ICD-10-CM | POA: Diagnosis not present

## 2020-05-14 DIAGNOSIS — F5104 Psychophysiologic insomnia: Secondary | ICD-10-CM | POA: Diagnosis not present

## 2020-05-14 DIAGNOSIS — I10 Essential (primary) hypertension: Secondary | ICD-10-CM | POA: Diagnosis not present

## 2020-05-14 DIAGNOSIS — Z7401 Bed confinement status: Secondary | ICD-10-CM | POA: Diagnosis not present

## 2020-05-15 DIAGNOSIS — I2510 Atherosclerotic heart disease of native coronary artery without angina pectoris: Secondary | ICD-10-CM | POA: Diagnosis not present

## 2020-05-15 DIAGNOSIS — G3183 Dementia with Lewy bodies: Secondary | ICD-10-CM | POA: Diagnosis not present

## 2020-05-15 DIAGNOSIS — R634 Abnormal weight loss: Secondary | ICD-10-CM | POA: Diagnosis not present

## 2020-05-15 DIAGNOSIS — N184 Chronic kidney disease, stage 4 (severe): Secondary | ICD-10-CM | POA: Diagnosis not present

## 2020-05-15 DIAGNOSIS — F0280 Dementia in other diseases classified elsewhere without behavioral disturbance: Secondary | ICD-10-CM | POA: Diagnosis not present

## 2020-05-15 DIAGNOSIS — I129 Hypertensive chronic kidney disease with stage 1 through stage 4 chronic kidney disease, or unspecified chronic kidney disease: Secondary | ICD-10-CM | POA: Diagnosis not present

## 2020-05-16 DIAGNOSIS — F0280 Dementia in other diseases classified elsewhere without behavioral disturbance: Secondary | ICD-10-CM | POA: Diagnosis not present

## 2020-05-16 DIAGNOSIS — I2510 Atherosclerotic heart disease of native coronary artery without angina pectoris: Secondary | ICD-10-CM | POA: Diagnosis not present

## 2020-05-16 DIAGNOSIS — G3183 Dementia with Lewy bodies: Secondary | ICD-10-CM | POA: Diagnosis not present

## 2020-05-16 DIAGNOSIS — I129 Hypertensive chronic kidney disease with stage 1 through stage 4 chronic kidney disease, or unspecified chronic kidney disease: Secondary | ICD-10-CM | POA: Diagnosis not present

## 2020-05-16 DIAGNOSIS — R634 Abnormal weight loss: Secondary | ICD-10-CM | POA: Diagnosis not present

## 2020-05-16 DIAGNOSIS — N184 Chronic kidney disease, stage 4 (severe): Secondary | ICD-10-CM | POA: Diagnosis not present

## 2020-05-18 DIAGNOSIS — F0280 Dementia in other diseases classified elsewhere without behavioral disturbance: Secondary | ICD-10-CM | POA: Diagnosis not present

## 2020-05-18 DIAGNOSIS — G3183 Dementia with Lewy bodies: Secondary | ICD-10-CM | POA: Diagnosis not present

## 2020-05-18 DIAGNOSIS — R634 Abnormal weight loss: Secondary | ICD-10-CM | POA: Diagnosis not present

## 2020-05-18 DIAGNOSIS — I2510 Atherosclerotic heart disease of native coronary artery without angina pectoris: Secondary | ICD-10-CM | POA: Diagnosis not present

## 2020-05-18 DIAGNOSIS — I129 Hypertensive chronic kidney disease with stage 1 through stage 4 chronic kidney disease, or unspecified chronic kidney disease: Secondary | ICD-10-CM | POA: Diagnosis not present

## 2020-05-18 DIAGNOSIS — N184 Chronic kidney disease, stage 4 (severe): Secondary | ICD-10-CM | POA: Diagnosis not present

## 2020-05-22 DIAGNOSIS — R410 Disorientation, unspecified: Secondary | ICD-10-CM | POA: Diagnosis not present

## 2020-05-22 DIAGNOSIS — R634 Abnormal weight loss: Secondary | ICD-10-CM | POA: Diagnosis not present

## 2020-05-22 DIAGNOSIS — H3530 Unspecified macular degeneration: Secondary | ICD-10-CM | POA: Diagnosis not present

## 2020-05-22 DIAGNOSIS — M4800 Spinal stenosis, site unspecified: Secondary | ICD-10-CM | POA: Diagnosis not present

## 2020-05-22 DIAGNOSIS — N184 Chronic kidney disease, stage 4 (severe): Secondary | ICD-10-CM | POA: Diagnosis not present

## 2020-05-22 DIAGNOSIS — R590 Localized enlarged lymph nodes: Secondary | ICD-10-CM | POA: Diagnosis not present

## 2020-05-22 DIAGNOSIS — L988 Other specified disorders of the skin and subcutaneous tissue: Secondary | ICD-10-CM | POA: Diagnosis not present

## 2020-05-22 DIAGNOSIS — I6523 Occlusion and stenosis of bilateral carotid arteries: Secondary | ICD-10-CM | POA: Diagnosis not present

## 2020-05-22 DIAGNOSIS — G3183 Dementia with Lewy bodies: Secondary | ICD-10-CM | POA: Diagnosis not present

## 2020-05-22 DIAGNOSIS — M1990 Unspecified osteoarthritis, unspecified site: Secondary | ICD-10-CM | POA: Diagnosis not present

## 2020-05-22 DIAGNOSIS — M549 Dorsalgia, unspecified: Secondary | ICD-10-CM | POA: Diagnosis not present

## 2020-05-22 DIAGNOSIS — I2510 Atherosclerotic heart disease of native coronary artery without angina pectoris: Secondary | ICD-10-CM | POA: Diagnosis not present

## 2020-05-22 DIAGNOSIS — F0280 Dementia in other diseases classified elsewhere without behavioral disturbance: Secondary | ICD-10-CM | POA: Diagnosis not present

## 2020-05-22 DIAGNOSIS — I129 Hypertensive chronic kidney disease with stage 1 through stage 4 chronic kidney disease, or unspecified chronic kidney disease: Secondary | ICD-10-CM | POA: Diagnosis not present

## 2020-05-22 DIAGNOSIS — E1140 Type 2 diabetes mellitus with diabetic neuropathy, unspecified: Secondary | ICD-10-CM | POA: Diagnosis not present

## 2020-05-23 DIAGNOSIS — N184 Chronic kidney disease, stage 4 (severe): Secondary | ICD-10-CM | POA: Diagnosis not present

## 2020-05-23 DIAGNOSIS — I2510 Atherosclerotic heart disease of native coronary artery without angina pectoris: Secondary | ICD-10-CM | POA: Diagnosis not present

## 2020-05-23 DIAGNOSIS — R634 Abnormal weight loss: Secondary | ICD-10-CM | POA: Diagnosis not present

## 2020-05-23 DIAGNOSIS — F0280 Dementia in other diseases classified elsewhere without behavioral disturbance: Secondary | ICD-10-CM | POA: Diagnosis not present

## 2020-05-23 DIAGNOSIS — I129 Hypertensive chronic kidney disease with stage 1 through stage 4 chronic kidney disease, or unspecified chronic kidney disease: Secondary | ICD-10-CM | POA: Diagnosis not present

## 2020-05-23 DIAGNOSIS — G3183 Dementia with Lewy bodies: Secondary | ICD-10-CM | POA: Diagnosis not present

## 2020-05-25 DIAGNOSIS — I129 Hypertensive chronic kidney disease with stage 1 through stage 4 chronic kidney disease, or unspecified chronic kidney disease: Secondary | ICD-10-CM | POA: Diagnosis not present

## 2020-05-25 DIAGNOSIS — F0280 Dementia in other diseases classified elsewhere without behavioral disturbance: Secondary | ICD-10-CM | POA: Diagnosis not present

## 2020-05-25 DIAGNOSIS — G3183 Dementia with Lewy bodies: Secondary | ICD-10-CM | POA: Diagnosis not present

## 2020-05-25 DIAGNOSIS — N184 Chronic kidney disease, stage 4 (severe): Secondary | ICD-10-CM | POA: Diagnosis not present

## 2020-05-25 DIAGNOSIS — I2510 Atherosclerotic heart disease of native coronary artery without angina pectoris: Secondary | ICD-10-CM | POA: Diagnosis not present

## 2020-05-25 DIAGNOSIS — R634 Abnormal weight loss: Secondary | ICD-10-CM | POA: Diagnosis not present

## 2020-05-29 DIAGNOSIS — R634 Abnormal weight loss: Secondary | ICD-10-CM | POA: Diagnosis not present

## 2020-05-29 DIAGNOSIS — I2510 Atherosclerotic heart disease of native coronary artery without angina pectoris: Secondary | ICD-10-CM | POA: Diagnosis not present

## 2020-05-29 DIAGNOSIS — I129 Hypertensive chronic kidney disease with stage 1 through stage 4 chronic kidney disease, or unspecified chronic kidney disease: Secondary | ICD-10-CM | POA: Diagnosis not present

## 2020-05-29 DIAGNOSIS — G3183 Dementia with Lewy bodies: Secondary | ICD-10-CM | POA: Diagnosis not present

## 2020-05-29 DIAGNOSIS — F0280 Dementia in other diseases classified elsewhere without behavioral disturbance: Secondary | ICD-10-CM | POA: Diagnosis not present

## 2020-05-29 DIAGNOSIS — N184 Chronic kidney disease, stage 4 (severe): Secondary | ICD-10-CM | POA: Diagnosis not present

## 2020-05-30 DIAGNOSIS — G3183 Dementia with Lewy bodies: Secondary | ICD-10-CM | POA: Diagnosis not present

## 2020-05-30 DIAGNOSIS — R634 Abnormal weight loss: Secondary | ICD-10-CM | POA: Diagnosis not present

## 2020-05-30 DIAGNOSIS — N184 Chronic kidney disease, stage 4 (severe): Secondary | ICD-10-CM | POA: Diagnosis not present

## 2020-05-30 DIAGNOSIS — I129 Hypertensive chronic kidney disease with stage 1 through stage 4 chronic kidney disease, or unspecified chronic kidney disease: Secondary | ICD-10-CM | POA: Diagnosis not present

## 2020-05-30 DIAGNOSIS — I2510 Atherosclerotic heart disease of native coronary artery without angina pectoris: Secondary | ICD-10-CM | POA: Diagnosis not present

## 2020-05-30 DIAGNOSIS — F0280 Dementia in other diseases classified elsewhere without behavioral disturbance: Secondary | ICD-10-CM | POA: Diagnosis not present

## 2020-06-01 DIAGNOSIS — I129 Hypertensive chronic kidney disease with stage 1 through stage 4 chronic kidney disease, or unspecified chronic kidney disease: Secondary | ICD-10-CM | POA: Diagnosis not present

## 2020-06-01 DIAGNOSIS — G3183 Dementia with Lewy bodies: Secondary | ICD-10-CM | POA: Diagnosis not present

## 2020-06-01 DIAGNOSIS — F0280 Dementia in other diseases classified elsewhere without behavioral disturbance: Secondary | ICD-10-CM | POA: Diagnosis not present

## 2020-06-01 DIAGNOSIS — I2510 Atherosclerotic heart disease of native coronary artery without angina pectoris: Secondary | ICD-10-CM | POA: Diagnosis not present

## 2020-06-01 DIAGNOSIS — N184 Chronic kidney disease, stage 4 (severe): Secondary | ICD-10-CM | POA: Diagnosis not present

## 2020-06-01 DIAGNOSIS — R634 Abnormal weight loss: Secondary | ICD-10-CM | POA: Diagnosis not present

## 2020-06-05 DIAGNOSIS — I129 Hypertensive chronic kidney disease with stage 1 through stage 4 chronic kidney disease, or unspecified chronic kidney disease: Secondary | ICD-10-CM | POA: Diagnosis not present

## 2020-06-05 DIAGNOSIS — F0280 Dementia in other diseases classified elsewhere without behavioral disturbance: Secondary | ICD-10-CM | POA: Diagnosis not present

## 2020-06-05 DIAGNOSIS — N184 Chronic kidney disease, stage 4 (severe): Secondary | ICD-10-CM | POA: Diagnosis not present

## 2020-06-05 DIAGNOSIS — R634 Abnormal weight loss: Secondary | ICD-10-CM | POA: Diagnosis not present

## 2020-06-05 DIAGNOSIS — G3183 Dementia with Lewy bodies: Secondary | ICD-10-CM | POA: Diagnosis not present

## 2020-06-05 DIAGNOSIS — I2510 Atherosclerotic heart disease of native coronary artery without angina pectoris: Secondary | ICD-10-CM | POA: Diagnosis not present

## 2020-06-06 DIAGNOSIS — N184 Chronic kidney disease, stage 4 (severe): Secondary | ICD-10-CM | POA: Diagnosis not present

## 2020-06-06 DIAGNOSIS — I2510 Atherosclerotic heart disease of native coronary artery without angina pectoris: Secondary | ICD-10-CM | POA: Diagnosis not present

## 2020-06-06 DIAGNOSIS — G3183 Dementia with Lewy bodies: Secondary | ICD-10-CM | POA: Diagnosis not present

## 2020-06-06 DIAGNOSIS — I129 Hypertensive chronic kidney disease with stage 1 through stage 4 chronic kidney disease, or unspecified chronic kidney disease: Secondary | ICD-10-CM | POA: Diagnosis not present

## 2020-06-06 DIAGNOSIS — F0280 Dementia in other diseases classified elsewhere without behavioral disturbance: Secondary | ICD-10-CM | POA: Diagnosis not present

## 2020-06-06 DIAGNOSIS — R634 Abnormal weight loss: Secondary | ICD-10-CM | POA: Diagnosis not present

## 2020-06-07 DIAGNOSIS — F0280 Dementia in other diseases classified elsewhere without behavioral disturbance: Secondary | ICD-10-CM | POA: Diagnosis not present

## 2020-06-07 DIAGNOSIS — N184 Chronic kidney disease, stage 4 (severe): Secondary | ICD-10-CM | POA: Diagnosis not present

## 2020-06-07 DIAGNOSIS — G3183 Dementia with Lewy bodies: Secondary | ICD-10-CM | POA: Diagnosis not present

## 2020-06-07 DIAGNOSIS — I129 Hypertensive chronic kidney disease with stage 1 through stage 4 chronic kidney disease, or unspecified chronic kidney disease: Secondary | ICD-10-CM | POA: Diagnosis not present

## 2020-06-07 DIAGNOSIS — R634 Abnormal weight loss: Secondary | ICD-10-CM | POA: Diagnosis not present

## 2020-06-07 DIAGNOSIS — I2510 Atherosclerotic heart disease of native coronary artery without angina pectoris: Secondary | ICD-10-CM | POA: Diagnosis not present

## 2020-06-08 DIAGNOSIS — G3183 Dementia with Lewy bodies: Secondary | ICD-10-CM | POA: Diagnosis not present

## 2020-06-08 DIAGNOSIS — N184 Chronic kidney disease, stage 4 (severe): Secondary | ICD-10-CM | POA: Diagnosis not present

## 2020-06-08 DIAGNOSIS — R634 Abnormal weight loss: Secondary | ICD-10-CM | POA: Diagnosis not present

## 2020-06-08 DIAGNOSIS — I129 Hypertensive chronic kidney disease with stage 1 through stage 4 chronic kidney disease, or unspecified chronic kidney disease: Secondary | ICD-10-CM | POA: Diagnosis not present

## 2020-06-08 DIAGNOSIS — I2510 Atherosclerotic heart disease of native coronary artery without angina pectoris: Secondary | ICD-10-CM | POA: Diagnosis not present

## 2020-06-08 DIAGNOSIS — F0280 Dementia in other diseases classified elsewhere without behavioral disturbance: Secondary | ICD-10-CM | POA: Diagnosis not present

## 2020-06-12 DIAGNOSIS — I129 Hypertensive chronic kidney disease with stage 1 through stage 4 chronic kidney disease, or unspecified chronic kidney disease: Secondary | ICD-10-CM | POA: Diagnosis not present

## 2020-06-12 DIAGNOSIS — N184 Chronic kidney disease, stage 4 (severe): Secondary | ICD-10-CM | POA: Diagnosis not present

## 2020-06-12 DIAGNOSIS — G3183 Dementia with Lewy bodies: Secondary | ICD-10-CM | POA: Diagnosis not present

## 2020-06-12 DIAGNOSIS — I2510 Atherosclerotic heart disease of native coronary artery without angina pectoris: Secondary | ICD-10-CM | POA: Diagnosis not present

## 2020-06-12 DIAGNOSIS — R634 Abnormal weight loss: Secondary | ICD-10-CM | POA: Diagnosis not present

## 2020-06-12 DIAGNOSIS — F0280 Dementia in other diseases classified elsewhere without behavioral disturbance: Secondary | ICD-10-CM | POA: Diagnosis not present

## 2020-06-13 DIAGNOSIS — N184 Chronic kidney disease, stage 4 (severe): Secondary | ICD-10-CM | POA: Diagnosis not present

## 2020-06-13 DIAGNOSIS — I129 Hypertensive chronic kidney disease with stage 1 through stage 4 chronic kidney disease, or unspecified chronic kidney disease: Secondary | ICD-10-CM | POA: Diagnosis not present

## 2020-06-13 DIAGNOSIS — F0280 Dementia in other diseases classified elsewhere without behavioral disturbance: Secondary | ICD-10-CM | POA: Diagnosis not present

## 2020-06-13 DIAGNOSIS — G3183 Dementia with Lewy bodies: Secondary | ICD-10-CM | POA: Diagnosis not present

## 2020-06-13 DIAGNOSIS — R634 Abnormal weight loss: Secondary | ICD-10-CM | POA: Diagnosis not present

## 2020-06-13 DIAGNOSIS — I2510 Atherosclerotic heart disease of native coronary artery without angina pectoris: Secondary | ICD-10-CM | POA: Diagnosis not present

## 2020-06-15 DIAGNOSIS — R634 Abnormal weight loss: Secondary | ICD-10-CM | POA: Diagnosis not present

## 2020-06-15 DIAGNOSIS — F0280 Dementia in other diseases classified elsewhere without behavioral disturbance: Secondary | ICD-10-CM | POA: Diagnosis not present

## 2020-06-15 DIAGNOSIS — I129 Hypertensive chronic kidney disease with stage 1 through stage 4 chronic kidney disease, or unspecified chronic kidney disease: Secondary | ICD-10-CM | POA: Diagnosis not present

## 2020-06-15 DIAGNOSIS — N184 Chronic kidney disease, stage 4 (severe): Secondary | ICD-10-CM | POA: Diagnosis not present

## 2020-06-15 DIAGNOSIS — I2510 Atherosclerotic heart disease of native coronary artery without angina pectoris: Secondary | ICD-10-CM | POA: Diagnosis not present

## 2020-06-15 DIAGNOSIS — G3183 Dementia with Lewy bodies: Secondary | ICD-10-CM | POA: Diagnosis not present

## 2020-06-19 DIAGNOSIS — F0280 Dementia in other diseases classified elsewhere without behavioral disturbance: Secondary | ICD-10-CM | POA: Diagnosis not present

## 2020-06-19 DIAGNOSIS — R634 Abnormal weight loss: Secondary | ICD-10-CM | POA: Diagnosis not present

## 2020-06-19 DIAGNOSIS — N184 Chronic kidney disease, stage 4 (severe): Secondary | ICD-10-CM | POA: Diagnosis not present

## 2020-06-19 DIAGNOSIS — G3183 Dementia with Lewy bodies: Secondary | ICD-10-CM | POA: Diagnosis not present

## 2020-06-19 DIAGNOSIS — I2510 Atherosclerotic heart disease of native coronary artery without angina pectoris: Secondary | ICD-10-CM | POA: Diagnosis not present

## 2020-06-19 DIAGNOSIS — I129 Hypertensive chronic kidney disease with stage 1 through stage 4 chronic kidney disease, or unspecified chronic kidney disease: Secondary | ICD-10-CM | POA: Diagnosis not present

## 2020-06-20 DIAGNOSIS — I129 Hypertensive chronic kidney disease with stage 1 through stage 4 chronic kidney disease, or unspecified chronic kidney disease: Secondary | ICD-10-CM | POA: Diagnosis not present

## 2020-06-20 DIAGNOSIS — I2510 Atherosclerotic heart disease of native coronary artery without angina pectoris: Secondary | ICD-10-CM | POA: Diagnosis not present

## 2020-06-20 DIAGNOSIS — R634 Abnormal weight loss: Secondary | ICD-10-CM | POA: Diagnosis not present

## 2020-06-20 DIAGNOSIS — G3183 Dementia with Lewy bodies: Secondary | ICD-10-CM | POA: Diagnosis not present

## 2020-06-20 DIAGNOSIS — N184 Chronic kidney disease, stage 4 (severe): Secondary | ICD-10-CM | POA: Diagnosis not present

## 2020-06-20 DIAGNOSIS — F0280 Dementia in other diseases classified elsewhere without behavioral disturbance: Secondary | ICD-10-CM | POA: Diagnosis not present

## 2020-06-22 DIAGNOSIS — F0280 Dementia in other diseases classified elsewhere without behavioral disturbance: Secondary | ICD-10-CM | POA: Diagnosis not present

## 2020-06-22 DIAGNOSIS — I6523 Occlusion and stenosis of bilateral carotid arteries: Secondary | ICD-10-CM | POA: Diagnosis not present

## 2020-06-22 DIAGNOSIS — M4800 Spinal stenosis, site unspecified: Secondary | ICD-10-CM | POA: Diagnosis not present

## 2020-06-22 DIAGNOSIS — L988 Other specified disorders of the skin and subcutaneous tissue: Secondary | ICD-10-CM | POA: Diagnosis not present

## 2020-06-22 DIAGNOSIS — I2510 Atherosclerotic heart disease of native coronary artery without angina pectoris: Secondary | ICD-10-CM | POA: Diagnosis not present

## 2020-06-22 DIAGNOSIS — E1140 Type 2 diabetes mellitus with diabetic neuropathy, unspecified: Secondary | ICD-10-CM | POA: Diagnosis not present

## 2020-06-22 DIAGNOSIS — R590 Localized enlarged lymph nodes: Secondary | ICD-10-CM | POA: Diagnosis not present

## 2020-06-22 DIAGNOSIS — M1990 Unspecified osteoarthritis, unspecified site: Secondary | ICD-10-CM | POA: Diagnosis not present

## 2020-06-22 DIAGNOSIS — G3183 Dementia with Lewy bodies: Secondary | ICD-10-CM | POA: Diagnosis not present

## 2020-06-22 DIAGNOSIS — M549 Dorsalgia, unspecified: Secondary | ICD-10-CM | POA: Diagnosis not present

## 2020-06-22 DIAGNOSIS — H3530 Unspecified macular degeneration: Secondary | ICD-10-CM | POA: Diagnosis not present

## 2020-06-22 DIAGNOSIS — N184 Chronic kidney disease, stage 4 (severe): Secondary | ICD-10-CM | POA: Diagnosis not present

## 2020-06-22 DIAGNOSIS — I129 Hypertensive chronic kidney disease with stage 1 through stage 4 chronic kidney disease, or unspecified chronic kidney disease: Secondary | ICD-10-CM | POA: Diagnosis not present

## 2020-06-22 DIAGNOSIS — R634 Abnormal weight loss: Secondary | ICD-10-CM | POA: Diagnosis not present

## 2020-06-22 DIAGNOSIS — R410 Disorientation, unspecified: Secondary | ICD-10-CM | POA: Diagnosis not present

## 2020-06-26 DIAGNOSIS — G3183 Dementia with Lewy bodies: Secondary | ICD-10-CM | POA: Diagnosis not present

## 2020-06-26 DIAGNOSIS — I129 Hypertensive chronic kidney disease with stage 1 through stage 4 chronic kidney disease, or unspecified chronic kidney disease: Secondary | ICD-10-CM | POA: Diagnosis not present

## 2020-06-26 DIAGNOSIS — N184 Chronic kidney disease, stage 4 (severe): Secondary | ICD-10-CM | POA: Diagnosis not present

## 2020-06-26 DIAGNOSIS — I2510 Atherosclerotic heart disease of native coronary artery without angina pectoris: Secondary | ICD-10-CM | POA: Diagnosis not present

## 2020-06-26 DIAGNOSIS — R634 Abnormal weight loss: Secondary | ICD-10-CM | POA: Diagnosis not present

## 2020-06-26 DIAGNOSIS — F0280 Dementia in other diseases classified elsewhere without behavioral disturbance: Secondary | ICD-10-CM | POA: Diagnosis not present

## 2020-06-27 DIAGNOSIS — G3183 Dementia with Lewy bodies: Secondary | ICD-10-CM | POA: Diagnosis not present

## 2020-06-27 DIAGNOSIS — R634 Abnormal weight loss: Secondary | ICD-10-CM | POA: Diagnosis not present

## 2020-06-27 DIAGNOSIS — N184 Chronic kidney disease, stage 4 (severe): Secondary | ICD-10-CM | POA: Diagnosis not present

## 2020-06-27 DIAGNOSIS — I129 Hypertensive chronic kidney disease with stage 1 through stage 4 chronic kidney disease, or unspecified chronic kidney disease: Secondary | ICD-10-CM | POA: Diagnosis not present

## 2020-06-27 DIAGNOSIS — I2510 Atherosclerotic heart disease of native coronary artery without angina pectoris: Secondary | ICD-10-CM | POA: Diagnosis not present

## 2020-06-27 DIAGNOSIS — F0280 Dementia in other diseases classified elsewhere without behavioral disturbance: Secondary | ICD-10-CM | POA: Diagnosis not present

## 2020-06-29 DIAGNOSIS — G3183 Dementia with Lewy bodies: Secondary | ICD-10-CM | POA: Diagnosis not present

## 2020-06-29 DIAGNOSIS — R634 Abnormal weight loss: Secondary | ICD-10-CM | POA: Diagnosis not present

## 2020-06-29 DIAGNOSIS — I129 Hypertensive chronic kidney disease with stage 1 through stage 4 chronic kidney disease, or unspecified chronic kidney disease: Secondary | ICD-10-CM | POA: Diagnosis not present

## 2020-06-29 DIAGNOSIS — I2510 Atherosclerotic heart disease of native coronary artery without angina pectoris: Secondary | ICD-10-CM | POA: Diagnosis not present

## 2020-06-29 DIAGNOSIS — F0280 Dementia in other diseases classified elsewhere without behavioral disturbance: Secondary | ICD-10-CM | POA: Diagnosis not present

## 2020-06-29 DIAGNOSIS — N184 Chronic kidney disease, stage 4 (severe): Secondary | ICD-10-CM | POA: Diagnosis not present

## 2020-07-01 DIAGNOSIS — F0280 Dementia in other diseases classified elsewhere without behavioral disturbance: Secondary | ICD-10-CM | POA: Diagnosis not present

## 2020-07-01 DIAGNOSIS — G3183 Dementia with Lewy bodies: Secondary | ICD-10-CM | POA: Diagnosis not present

## 2020-07-01 DIAGNOSIS — R634 Abnormal weight loss: Secondary | ICD-10-CM | POA: Diagnosis not present

## 2020-07-01 DIAGNOSIS — I129 Hypertensive chronic kidney disease with stage 1 through stage 4 chronic kidney disease, or unspecified chronic kidney disease: Secondary | ICD-10-CM | POA: Diagnosis not present

## 2020-07-01 DIAGNOSIS — N184 Chronic kidney disease, stage 4 (severe): Secondary | ICD-10-CM | POA: Diagnosis not present

## 2020-07-01 DIAGNOSIS — I2510 Atherosclerotic heart disease of native coronary artery without angina pectoris: Secondary | ICD-10-CM | POA: Diagnosis not present

## 2020-07-03 DIAGNOSIS — N184 Chronic kidney disease, stage 4 (severe): Secondary | ICD-10-CM | POA: Diagnosis not present

## 2020-07-03 DIAGNOSIS — R634 Abnormal weight loss: Secondary | ICD-10-CM | POA: Diagnosis not present

## 2020-07-03 DIAGNOSIS — I2510 Atherosclerotic heart disease of native coronary artery without angina pectoris: Secondary | ICD-10-CM | POA: Diagnosis not present

## 2020-07-03 DIAGNOSIS — F0280 Dementia in other diseases classified elsewhere without behavioral disturbance: Secondary | ICD-10-CM | POA: Diagnosis not present

## 2020-07-03 DIAGNOSIS — G3183 Dementia with Lewy bodies: Secondary | ICD-10-CM | POA: Diagnosis not present

## 2020-07-03 DIAGNOSIS — I129 Hypertensive chronic kidney disease with stage 1 through stage 4 chronic kidney disease, or unspecified chronic kidney disease: Secondary | ICD-10-CM | POA: Diagnosis not present

## 2020-07-04 DIAGNOSIS — I2510 Atherosclerotic heart disease of native coronary artery without angina pectoris: Secondary | ICD-10-CM | POA: Diagnosis not present

## 2020-07-04 DIAGNOSIS — N184 Chronic kidney disease, stage 4 (severe): Secondary | ICD-10-CM | POA: Diagnosis not present

## 2020-07-04 DIAGNOSIS — R634 Abnormal weight loss: Secondary | ICD-10-CM | POA: Diagnosis not present

## 2020-07-04 DIAGNOSIS — I129 Hypertensive chronic kidney disease with stage 1 through stage 4 chronic kidney disease, or unspecified chronic kidney disease: Secondary | ICD-10-CM | POA: Diagnosis not present

## 2020-07-04 DIAGNOSIS — F0280 Dementia in other diseases classified elsewhere without behavioral disturbance: Secondary | ICD-10-CM | POA: Diagnosis not present

## 2020-07-04 DIAGNOSIS — G3183 Dementia with Lewy bodies: Secondary | ICD-10-CM | POA: Diagnosis not present

## 2020-07-05 DIAGNOSIS — G3183 Dementia with Lewy bodies: Secondary | ICD-10-CM | POA: Diagnosis not present

## 2020-07-05 DIAGNOSIS — N184 Chronic kidney disease, stage 4 (severe): Secondary | ICD-10-CM | POA: Diagnosis not present

## 2020-07-05 DIAGNOSIS — I2510 Atherosclerotic heart disease of native coronary artery without angina pectoris: Secondary | ICD-10-CM | POA: Diagnosis not present

## 2020-07-05 DIAGNOSIS — F0280 Dementia in other diseases classified elsewhere without behavioral disturbance: Secondary | ICD-10-CM | POA: Diagnosis not present

## 2020-07-05 DIAGNOSIS — I129 Hypertensive chronic kidney disease with stage 1 through stage 4 chronic kidney disease, or unspecified chronic kidney disease: Secondary | ICD-10-CM | POA: Diagnosis not present

## 2020-07-05 DIAGNOSIS — R634 Abnormal weight loss: Secondary | ICD-10-CM | POA: Diagnosis not present

## 2020-07-06 DIAGNOSIS — I129 Hypertensive chronic kidney disease with stage 1 through stage 4 chronic kidney disease, or unspecified chronic kidney disease: Secondary | ICD-10-CM | POA: Diagnosis not present

## 2020-07-06 DIAGNOSIS — I2510 Atherosclerotic heart disease of native coronary artery without angina pectoris: Secondary | ICD-10-CM | POA: Diagnosis not present

## 2020-07-06 DIAGNOSIS — R634 Abnormal weight loss: Secondary | ICD-10-CM | POA: Diagnosis not present

## 2020-07-06 DIAGNOSIS — G3183 Dementia with Lewy bodies: Secondary | ICD-10-CM | POA: Diagnosis not present

## 2020-07-06 DIAGNOSIS — N184 Chronic kidney disease, stage 4 (severe): Secondary | ICD-10-CM | POA: Diagnosis not present

## 2020-07-06 DIAGNOSIS — F0280 Dementia in other diseases classified elsewhere without behavioral disturbance: Secondary | ICD-10-CM | POA: Diagnosis not present

## 2020-07-07 DIAGNOSIS — I2510 Atherosclerotic heart disease of native coronary artery without angina pectoris: Secondary | ICD-10-CM | POA: Diagnosis not present

## 2020-07-07 DIAGNOSIS — R634 Abnormal weight loss: Secondary | ICD-10-CM | POA: Diagnosis not present

## 2020-07-07 DIAGNOSIS — N184 Chronic kidney disease, stage 4 (severe): Secondary | ICD-10-CM | POA: Diagnosis not present

## 2020-07-07 DIAGNOSIS — I129 Hypertensive chronic kidney disease with stage 1 through stage 4 chronic kidney disease, or unspecified chronic kidney disease: Secondary | ICD-10-CM | POA: Diagnosis not present

## 2020-07-07 DIAGNOSIS — F0280 Dementia in other diseases classified elsewhere without behavioral disturbance: Secondary | ICD-10-CM | POA: Diagnosis not present

## 2020-07-07 DIAGNOSIS — G3183 Dementia with Lewy bodies: Secondary | ICD-10-CM | POA: Diagnosis not present

## 2020-07-08 DIAGNOSIS — I2510 Atherosclerotic heart disease of native coronary artery without angina pectoris: Secondary | ICD-10-CM | POA: Diagnosis not present

## 2020-07-08 DIAGNOSIS — I129 Hypertensive chronic kidney disease with stage 1 through stage 4 chronic kidney disease, or unspecified chronic kidney disease: Secondary | ICD-10-CM | POA: Diagnosis not present

## 2020-07-08 DIAGNOSIS — N184 Chronic kidney disease, stage 4 (severe): Secondary | ICD-10-CM | POA: Diagnosis not present

## 2020-07-08 DIAGNOSIS — F0280 Dementia in other diseases classified elsewhere without behavioral disturbance: Secondary | ICD-10-CM | POA: Diagnosis not present

## 2020-07-08 DIAGNOSIS — R634 Abnormal weight loss: Secondary | ICD-10-CM | POA: Diagnosis not present

## 2020-07-08 DIAGNOSIS — G3183 Dementia with Lewy bodies: Secondary | ICD-10-CM | POA: Diagnosis not present

## 2020-07-09 DIAGNOSIS — I2510 Atherosclerotic heart disease of native coronary artery without angina pectoris: Secondary | ICD-10-CM | POA: Diagnosis not present

## 2020-07-09 DIAGNOSIS — F0280 Dementia in other diseases classified elsewhere without behavioral disturbance: Secondary | ICD-10-CM | POA: Diagnosis not present

## 2020-07-09 DIAGNOSIS — R634 Abnormal weight loss: Secondary | ICD-10-CM | POA: Diagnosis not present

## 2020-07-09 DIAGNOSIS — G3183 Dementia with Lewy bodies: Secondary | ICD-10-CM | POA: Diagnosis not present

## 2020-07-09 DIAGNOSIS — I129 Hypertensive chronic kidney disease with stage 1 through stage 4 chronic kidney disease, or unspecified chronic kidney disease: Secondary | ICD-10-CM | POA: Diagnosis not present

## 2020-07-09 DIAGNOSIS — N184 Chronic kidney disease, stage 4 (severe): Secondary | ICD-10-CM | POA: Diagnosis not present

## 2020-07-22 DEATH — deceased
